# Patient Record
Sex: Female | Born: 1935
Health system: Southern US, Community
[De-identification: ages and names within clinical notes are randomized; demographics above are authoritative.]

## PROBLEM LIST (undated history)

## (undated) DIAGNOSIS — Z8679 Personal history of other diseases of the circulatory system: Secondary | ICD-10-CM

## (undated) DIAGNOSIS — H9190 Unspecified hearing loss, unspecified ear: Secondary | ICD-10-CM

## (undated) DIAGNOSIS — H8110 Benign paroxysmal vertigo, unspecified ear: Secondary | ICD-10-CM

## (undated) DIAGNOSIS — M069 Rheumatoid arthritis, unspecified: Secondary | ICD-10-CM

## (undated) DIAGNOSIS — I671 Cerebral aneurysm, nonruptured: Secondary | ICD-10-CM

## (undated) DIAGNOSIS — R55 Syncope and collapse: Secondary | ICD-10-CM

## (undated) DIAGNOSIS — I6359 Cerebral infarction due to unspecified occlusion or stenosis of other cerebral artery: Secondary | ICD-10-CM

## (undated) DIAGNOSIS — I480 Paroxysmal atrial fibrillation: Secondary | ICD-10-CM

## (undated) DIAGNOSIS — E785 Hyperlipidemia, unspecified: Secondary | ICD-10-CM

## (undated) DIAGNOSIS — F32A Depression, unspecified: Secondary | ICD-10-CM

## (undated) DIAGNOSIS — F329 Major depressive disorder, single episode, unspecified: Secondary | ICD-10-CM

## (undated) DIAGNOSIS — S0990XA Unspecified injury of head, initial encounter: Secondary | ICD-10-CM

## (undated) DIAGNOSIS — D649 Anemia, unspecified: Secondary | ICD-10-CM

## (undated) DIAGNOSIS — I609 Nontraumatic subarachnoid hemorrhage, unspecified: Secondary | ICD-10-CM

## (undated) DIAGNOSIS — I10 Essential (primary) hypertension: Secondary | ICD-10-CM

## (undated) DIAGNOSIS — I639 Cerebral infarction, unspecified: Secondary | ICD-10-CM

## (undated) DIAGNOSIS — I517 Cardiomegaly: Secondary | ICD-10-CM

## (undated) DIAGNOSIS — Z9889 Other specified postprocedural states: Secondary | ICD-10-CM

## (undated) HISTORY — DX: Cerebral aneurysm, nonruptured: I67.1

## (undated) HISTORY — DX: Nontraumatic subarachnoid hemorrhage, unspecified: I60.9

## (undated) HISTORY — PX: BREAST SURGERY: SHX581

## (undated) HISTORY — DX: Unspecified injury of head, initial encounter: S09.90XA

## (undated) HISTORY — DX: Cerebral infarction due to unspecified occlusion or stenosis of other cerebral artery: I63.59

## (undated) HISTORY — PX: EYE SURGERY: SHX253

## (undated) HISTORY — DX: Paroxysmal atrial fibrillation: I48.0

## (undated) HISTORY — DX: Hyperlipidemia, unspecified: E78.5

## (undated) HISTORY — DX: Benign paroxysmal vertigo, unspecified ear: H81.10

## (undated) HISTORY — DX: Essential (primary) hypertension: I10

## (undated) HISTORY — DX: Syncope and collapse: R55

## (undated) HISTORY — PX: ABDOMINAL HYSTERECTOMY: SHX81

## (undated) HISTORY — DX: Cardiomegaly: I51.7

---

## 2002-04-05 ENCOUNTER — Ambulatory Visit (HOSPITAL_COMMUNITY): Admission: RE | Admit: 2002-04-05 | Discharge: 2002-04-05 | Payer: Self-pay | Admitting: Specialist

## 2002-07-27 ENCOUNTER — Ambulatory Visit (HOSPITAL_COMMUNITY): Admission: RE | Admit: 2002-07-27 | Discharge: 2002-07-27 | Payer: Self-pay | Admitting: Specialist

## 2002-07-27 ENCOUNTER — Encounter: Payer: Self-pay | Admitting: Specialist

## 2004-03-17 ENCOUNTER — Encounter (INDEPENDENT_AMBULATORY_CARE_PROVIDER_SITE_OTHER): Payer: Self-pay | Admitting: *Deleted

## 2004-03-17 ENCOUNTER — Ambulatory Visit (HOSPITAL_COMMUNITY): Admission: RE | Admit: 2004-03-17 | Discharge: 2004-03-17 | Payer: Self-pay | Admitting: Gastroenterology

## 2007-08-21 ENCOUNTER — Inpatient Hospital Stay (HOSPITAL_COMMUNITY): Admission: EM | Admit: 2007-08-21 | Discharge: 2007-08-25 | Payer: Self-pay | Admitting: Emergency Medicine

## 2010-10-28 NOTE — Consult Note (Signed)
NAME:  FLORDIA, KASSEM NO.:  [id number]   MEDICAL RECORD NO.:  [id number]          PATIENT TYPE:  INP   LOCATION:  1520                         FACILITY:  [facility]   PHYSICIAN:  Ramiro Harvest, MD    DATE OF BIRTH:  [birth year]   DATE OF CONSULTATION:  08/24/2007  DATE OF DISCHARGE:                                 CONSULTATION   PRIMARY CARE PHYSICIAN:  Dr. Earl Gala of [facility]   ENT PHYSICIAN:  Dr. Lazarus Salines.   REASON FOR CONSULTATION:  Shortness of breath.   HISTORY OF PRESENT ILLNESS:  Valaria Kohut is a 81-year white female  with history of intermittent parotitis, hypertension, hyperlipidemia,  depression who was admitted on August 21, 2007 for right parotid  sialolithiasis and sialoadenitis per ENT.  The patient was placed on IV  antibiotics, IV fluids,  sialogogues  and a blood culture obtained.  The  patient subsequently passed a stone on August 22, 2007 which was a 3 x 3 x  8 mm stone without symptomatic improvement.  The patient had some mild  complaints of shortness of breath overnight on August 23, 2007 with sats  in 80's to 90's.  The patient was placed on continuous oxygen monitor  and oxygen via nasal cannula with sats going up to 92-95.  The patient  currently states that the shortness of breath was on exertion when she  tried to move last night.  Denied any chest pain, no palpitations.  No  calf tenderness, no diaphoresis, no emesis, no edema.  Did have some  associated nausea but no other associated symptoms.  The patient states  her shortness of breath has improved since last night and is currently  chest pain free.   ALLERGIES:  No known drug allergies.   PAST MEDICAL HISTORY:  1. Intermittent parotitis on the right.  2. Hypertension.  3. Hyperlipidemia.  4. Depression.  5. History of heart murmur.  6. Macular degeneration.   HOME MEDICATIONS:  1. Lipitor 10 mg daily.  2. Celexa 20 mg daily.  3. Benicar/HCT 40/12.5 mg daily.  4.  Coreg 25 mg p.o. b.i.d.  5. Norvasc 5 mg p.o. daily.   HOSPITAL MEDICATIONS:  1. Norvasc 5 mg daily.  2. Unasyn 3 g IV q.6 h.  3. Coreg 25 mg b.i.d.  4. Celexa 20 mg daily.  5. Avapro 300 mg daily.  6. HCTZ 12.5 mg daily.  7. Zocor 20 mg daily.  8. Vancomycin 750 mg IV q.36 h.  9. D5 LR 100 mL per hour, now at [facility]   SOCIAL HISTORY:  The patient lives in [place] with her husband and  daughter.  No tobacco abuse.  No alcohol use.  No IV drug use.  The  patient has two daughters and a son all of whom are healthy.   FAMILY HISTORY:  Noncontributory.   REVIEW OF SYSTEMS:  Positive for emesis; otherwise per HPI and otherwise  negative.   PHYSICAL EXAMINATION:  VITAL SIGNS:  Temperature 99.2, pulse 70, blood  pressure 129/66, respiratory rate 20, satting 95% on room air, I/O  2815, weight on August 22, 2007 was 75.48, today's weight is 77.56.  GENERAL:  Patient in no apparent distress sitting on bedside.  HEENT:  Normocephalic, atraumatic.  Pupils equal, round, and reactive to  light.  Extraocular movements intact.  Oropharynx was clear.  No  lesions.  Right parotid gland tender to palpation or edema, positive  swelling.  RESPIRATORY:  Bibasilar crackles.  No wheezes.  CARDIOVASCULAR:  Regular rate and rhythm with a 2/6 systolic ejection  murmur.  ABDOMEN:  Soft, nontender, nondistended.  Positive bowel sounds.  EXTREMITIES:  No clubbing, cyanosis or edema.  NEUROLOGICAL:  The patient is alert and oriented x3.  Cranial nerves II-  XII are grossly intact.  No focal deficits.   LABORATORY DATA:  Labs from August 23, 2007.  BMET:  Sodium 139,  potassium 3.9, chloride 106, bicarb 29, BUN 7, creatinine 1.09. Calcium  9.2.  Glucose 106.  CBC from August 22, 2007:  White count 11.2,  hemoglobin 10.2, platelets 228, hematocrit 89.2.   EKG with T wave inversion in leads 1, aVL, V2-V6.  Chest x-ray was  pending.   ASSESSMENT/PLAN:  Meryl Hubers is a 77 year old female with history  of  hypertension, hyperlipidemia, intermittent right parotitis who  presented to the hospital with a right parotid sialolithiasis and  sialoadenitis status post passage of a stone on IV antibiotics and was  short of breath last night.   ASSESSMENT/PLAN:  1. Shortness of breath likely secondary to volume overload.  Patient      with increased weight gain versus respiratory/ pneumonia.  Chest x-      ray is pending versus pulmonary embolism unlikely with a low      pretest probability versus acute coronary syndrome.  EKG does have      T wave inversions in leads 1, AVL, V2-V6.  Chest x-ray is pending.      Will check a BNP cycle, cardiac enzymes q.8 h. x3.  Check a BMET,      check a CBC.  Get an old EKG either from this hospital or from PCP.      Will give a dose of Lasix 40 mg IV x1 now and continue Coreg for      now.  Will follow along. Continue Coreg, Lipitor, Avapro, and HCTZ.  2. Anemia.  No evidence of overt gastrointestinal bleed.  Will check      an anemia panel, guaiac stools x3.  3. Hypertension.  Continue Avapro, HCTZ, Coreg, and Norvasc.  4. Hyperlipidemia.  Continue Zocor.  5. Depression.  Continue Celexa prophylaxis, Protonix for      gastrointestinal prophylaxis.  Patient with stockings for deep      venous thrombosis prophylaxis.   It has been a pleasure taking care of Ms. Julieanne Cotton.      Ramiro Harvest, MD  Electronically Signed     DT/MEDQ  D:  08/24/2007  T:  08/25/2007  Job:  [id number]

## 2010-10-31 NOTE — Discharge Summary (Signed)
NAME:  Tabitha Sanders, Tabitha Sanders NO.:  [id number]   MEDICAL RECORD NO.:  [id number]          PATIENT TYPE:  INP   LOCATION:  1520                         FACILITY:  [facility]   PHYSICIAN:  Zola Button T. Lazarus Salines, M.D. DATE OF BIRTH:  [birth year]   DATE OF ADMISSION:  08/21/2007  DATE OF DISCHARGE:  08/25/2007                               DISCHARGE SUMMARY   CHIEF COMPLAINTS:  Right cheek swelling.   HISTORY OF PRESENT ILLNESS:  A 77 year old white female with a history  of intermittent right parotitis over the years, treated successfully  with clindamycin.  She recently has developed increased pain, swelling  and probably fevers associated with the right parotid gland once again.  She was evaluated in the emergency room and felt to be appropriate for  intravenous antibiosis and hospital observation.   PAST MEDICAL HISTORY:  Negative for drug allergies.  She has  hypertension, hyperlipidemia, and history of depression.  She is taking  Lipitor, Celexa, Benicar/hydrochlorothiazide, Coreg, and Norvasc.  No  prior surgeries listed.   SOCIAL HISTORY:  She is a nonsmoker.   FAMILY HISTORY:  None available.   REVIEW OF SYSTEMS:  Noncontributory.   PHYSICAL EXAMINATION:  GENERAL:  This is a somewhat heavy-set older  white female with a swollen, tender right cheek.  Temperature 98.7.  HEENT:  Remarkable for swelling of the right parotid gland with erythema  and some pitting edema.  There is purulent discharge from right  Stensen's papilla, but no obvious stone or obstruction.  CHEST:  Clear and unlabored.  HEART:  Regular rate and rhythm without murmurs.  ABDOMEN:  Obese, soft and active.  SKIN:  Normal configuration.  NEUROLOGIC:  Intact.   ADMISSION LABORATORY STUDIES:  White blood cell count 13,000, hematocrit  31.4, hemoglobin 11.1 with a left shift.  Electrolytes were not  performed at that point.   ADMISSION DIAGNOSIS:  Acute right suppurative parotitis.   HOSPITAL COURSE:   She was admitted to the hospital and begun on  intravenous Unasyn.  Orders were written for sialogogues, warm  compresses, massage, and elevation.  On the second hospital day, the  white count was down to 10,200 but she has continued to have severe pain  and swelling of her right cheek.  She had some nausea either related to  the narcotics or the Unasyn.  There was no sialogogues present at the  bedside on rounds.  On the second day, vancomycin was added for possible  MRSA coverage, Zofran was added for nausea and then a BMET-7 was checked  for vancomycin protocol.  Creatinine was 1.1.  She was continued in the  hospital.  On the third hospital day, she reported that she had passed a  stone from the right Stensen's duct which she showed me and measured 3 x  3 x 8 mm.  She had obvious drainage following this which improved her  comfort level, but she still had substantial swelling and pitting edema  of the cheeks. A CT scan showed no additional stones.  Into the fourth  postoperative day, she noted some shortness of breath, air hunger and  feeling bloated.  No pedal edema.  She denied abdominal gas but still  some nausea.  The parotid gland was improving.  On examination, there  was no obvious wheezing or labored respiration.  A hospitalist consult  was called for and the patient was felt to have possible fluid overload,  which responded promptly on diuretics.  On the fifth hospital day with  culture showing only Candida albicans, probably contaminant and the  patient responding on intravenous antibiotics, she was discharged to her  home in the care of herself and her family.  She was asked to continue  with elevation, massage, warm compresses, and sialogogues.  An  incidental note was made of hypokalemia and anemia which will be  addressed with the hospitalist and with Dr. Earl Gala, her primary  physician.   DISCHARGE DIAGNOSES:  1. Right parotid sialoadenitis.  2. Right parotid  sialolithiasis.  3. Fluid overload.  4. Anemia.  5. Hypokalemia.  6. Hyperlipidemia   PROCEDURE PERFORMED:  None.   CONDITION AT DISCHARGE:  Ambulatory with no dyspnea, improving  parotitis, pain controlled and taking a full regular diet.  Return visit  3-4 days, Dr. Lazarus Salines; 1 week, Dr. Earl Gala.   COMPLICATIONS:  Fluid overload.   PRESCRIPTIONS:  Augmentin XR 1000 mg 2 pills twice daily.      Gloris Manchester. Lazarus Salines, M.D.  Electronically Signed     KTW/MEDQ  D:  11/21/2007  T:  11/22/2007  Job:  [id number]   cc:   Theressa Millard, M.D.  Fax: [phone]

## 2010-10-31 NOTE — Op Note (Signed)
NAME:  Tabitha Sanders, Tabitha Sanders NO.:  [id number]   MEDICAL RECORD NO.:  [id number]          PATIENT TYPE:  AMB   LOCATION:  ENDO                         FACILITY:  [facility]   PHYSICIAN:  Danise Edge, M.D.   DATE OF BIRTH:  Feb 16, 1926   DATE OF PROCEDURE:  03/17/2004  DATE OF DISCHARGE:                                 OPERATIVE REPORT   PROCEDURE:  Screening colonoscopy with polypectomy.   INDICATIONS FOR PROCEDURE:  Tabitha Sanders is a 77 year old female born  10/06/1925.  Tabitha Sanders is scheduled to undergo her first screening  colonoscopy with polypectomy to prevent colon cancer.   ENDOSCOPIST:  Danise Edge, M.D.   PREMEDICATION:  Versed 6 mg, Demerol 60 mg.   DESCRIPTION OF PROCEDURE:  After obtaining informed consent, Tabitha Sanders  was placed in the left lateral decubitus position. I administered  intravenous Demerol and intravenous Versed to achieve conscious sedation for  the procedure. The patient's blood pressure, oxygen saturation and cardiac  rhythm were monitored throughout the procedure and documented in the medical  record.   Anal inspection and digital rectal exam were normal. The Olympus adjustable  pediatric colonoscope was introduced into the rectum and advanced to the  cecum. Colonic preparation for the exam today was excellent.   RECTUM:  Normal.   SIGMOID COLON AND DESCENDING COLON:  Normal.   SPLENIC FLEXURE:  Normal.   TRANSVERSE COLON:  From the mid transverse colon, a 2 mm sessile polyp was  removed with the electrocautery snare.   HEPATIC FLEXURE:  Normal.   ASCENDING COLON:  Normal.   CECUM AND ILEOCECAL VALVE:  From the proximal cecum, a 1 mm sessile polyp  was removed with the electrocautery snare. There was a small amount of  arterial bleeding post polypectomy and an Endoclip was applied to achieve  hemostasis.   ASSESSMENT:  A small polyp was removed from the cecum and a small polyp was  removed from the mid  transverse colon. Both polyps were submitted in one  bottle for pathological evaluation.      MJ/MEDQ  D:  03/17/2004  T:  03/17/2004  Job:  [id number]   cc:   Theressa Millard, M.D.  [address] [place]  [place] [address]  Fax: [phone]

## 2011-03-09 LAB — CBC
HCT: 29.5 — ABNORMAL LOW
HCT: 31.4 — ABNORMAL LOW
Hemoglobin: 10.4 — ABNORMAL LOW
Hemoglobin: 11.1 — ABNORMAL LOW
MCHC: 35.1
MCHC: 35.3
MCV: 88.9
MCV: 89.2
Platelets: 228
Platelets: 279
RBC: 3.19 — ABNORMAL LOW
RBC: 3.26 — ABNORMAL LOW
RBC: 3.53 — ABNORMAL LOW
RDW: 13
RDW: 13.3
WBC: 11.2 — ABNORMAL HIGH
WBC: 13 — ABNORMAL HIGH

## 2011-03-09 LAB — DIFFERENTIAL
Basophils Absolute: 0
Eosinophils Absolute: 0
Eosinophils Absolute: 0.1
Eosinophils Relative: 1
Lymphocytes Relative: 10 — ABNORMAL LOW
Lymphocytes Relative: 9 — ABNORMAL LOW
Lymphs Abs: 1
Lymphs Abs: 1.1
Monocytes Absolute: 0.8
Monocytes Relative: 8
Monocytes Relative: 9
Neutro Abs: 8.1 — ABNORMAL HIGH
Neutro Abs: 9.2 — ABNORMAL HIGH
Neutrophils Relative %: 81 — ABNORMAL HIGH
Neutrophils Relative %: 82 — ABNORMAL HIGH

## 2011-03-09 LAB — CARDIAC PANEL(CRET KIN+CKTOT+MB+TROPI)
CK, MB: 1.2
CK, MB: 1.7
CK, MB: 1.9
Relative Index: 1.4
Relative Index: INVALID
Total CK: 85
Troponin I: 0.04

## 2011-03-09 LAB — CREATININE, SERUM
Creatinine, Ser: 1.1
GFR calc Af Amer: 58 — ABNORMAL LOW
GFR calc non Af Amer: 48 — ABNORMAL LOW

## 2011-03-09 LAB — BASIC METABOLIC PANEL
BUN: 4 — ABNORMAL LOW
CO2: 30
Calcium: 9.2
Calcium: 9.4
Chloride: 98
GFR calc Af Amer: 56 — ABNORMAL LOW
GFR calc Af Amer: 59 — ABNORMAL LOW
GFR calc non Af Amer: 46 — ABNORMAL LOW
GFR calc non Af Amer: 48 — ABNORMAL LOW
GFR calc non Af Amer: 51 — ABNORMAL LOW
Glucose, Bld: 128 — ABNORMAL HIGH
Potassium: 3 — ABNORMAL LOW
Potassium: 3.9
Sodium: 138
Sodium: 138
Sodium: 139

## 2011-03-09 LAB — RETICULOCYTES
RBC.: 3.47 — ABNORMAL LOW
Retic Ct Pct: 1.8

## 2011-03-09 LAB — IRON AND TIBC
Saturation Ratios: 9 — ABNORMAL LOW
UIBC: 261

## 2011-03-09 LAB — CULTURE, ROUTINE-SINUS

## 2011-03-09 LAB — FOLATE: Folate: 14.7

## 2014-06-15 DIAGNOSIS — Z8679 Personal history of other diseases of the circulatory system: Secondary | ICD-10-CM

## 2014-06-15 DIAGNOSIS — Z9889 Other specified postprocedural states: Secondary | ICD-10-CM

## 2014-06-15 HISTORY — DX: Other specified postprocedural states: Z98.890

## 2014-06-15 HISTORY — DX: Personal history of other diseases of the circulatory system: Z86.79

## 2014-08-16 ENCOUNTER — Other Ambulatory Visit: Payer: Self-pay | Admitting: Gastroenterology

## 2014-08-17 ENCOUNTER — Encounter (HOSPITAL_COMMUNITY): Payer: Self-pay | Admitting: *Deleted

## 2014-08-20 ENCOUNTER — Encounter (HOSPITAL_COMMUNITY)
Admission: RE | Disposition: A | Discharge status: Home / Self Care | Payer: Self-pay | Source: Ambulatory Visit | Attending: Gastroenterology

## 2014-08-20 ENCOUNTER — Encounter (HOSPITAL_COMMUNITY): Payer: Self-pay | Admitting: *Deleted

## 2014-08-20 ENCOUNTER — Ambulatory Visit (HOSPITAL_COMMUNITY): Payer: Medicare Other | Admitting: Certified Registered Nurse Anesthetist

## 2014-08-20 ENCOUNTER — Ambulatory Visit (HOSPITAL_COMMUNITY)
Admission: RE | Admit: 2014-08-20 | Discharge: 2014-08-20 | Disposition: A | Discharge status: Home / Self Care | Payer: Medicare Other | Source: Ambulatory Visit | Attending: Gastroenterology | Admitting: Gastroenterology

## 2014-08-20 DIAGNOSIS — Z90722 Acquired absence of ovaries, bilateral: Secondary | ICD-10-CM | POA: Insufficient documentation

## 2014-08-20 DIAGNOSIS — I10 Essential (primary) hypertension: Secondary | ICD-10-CM | POA: Diagnosis not present

## 2014-08-20 DIAGNOSIS — Z7982 Long term (current) use of aspirin: Secondary | ICD-10-CM | POA: Diagnosis not present

## 2014-08-20 DIAGNOSIS — H3530 Unspecified macular degeneration: Secondary | ICD-10-CM | POA: Diagnosis not present

## 2014-08-20 DIAGNOSIS — E780 Pure hypercholesterolemia: Secondary | ICD-10-CM | POA: Insufficient documentation

## 2014-08-20 DIAGNOSIS — Z79899 Other long term (current) drug therapy: Secondary | ICD-10-CM | POA: Insufficient documentation

## 2014-08-20 DIAGNOSIS — M353 Polymyalgia rheumatica: Secondary | ICD-10-CM | POA: Insufficient documentation

## 2014-08-20 DIAGNOSIS — D510 Vitamin B12 deficiency anemia due to intrinsic factor deficiency: Secondary | ICD-10-CM | POA: Diagnosis not present

## 2014-08-20 DIAGNOSIS — M797 Fibromyalgia: Secondary | ICD-10-CM | POA: Diagnosis not present

## 2014-08-20 DIAGNOSIS — D123 Benign neoplasm of transverse colon: Secondary | ICD-10-CM | POA: Insufficient documentation

## 2014-08-20 DIAGNOSIS — D509 Iron deficiency anemia, unspecified: Secondary | ICD-10-CM | POA: Insufficient documentation

## 2014-08-20 DIAGNOSIS — Z90710 Acquired absence of both cervix and uterus: Secondary | ICD-10-CM | POA: Diagnosis not present

## 2014-08-20 HISTORY — PX: COLONOSCOPY WITH PROPOFOL: SHX5780

## 2014-08-20 HISTORY — DX: Essential (primary) hypertension: I10

## 2014-08-20 HISTORY — DX: Major depressive disorder, single episode, unspecified: F32.9

## 2014-08-20 HISTORY — DX: Depression, unspecified: F32.A

## 2014-08-20 HISTORY — DX: Anemia, unspecified: D64.9

## 2014-08-20 SURGERY — COLONOSCOPY WITH PROPOFOL
Anesthesia: Monitor Anesthesia Care

## 2014-08-20 MED ORDER — PROPOFOL 10 MG/ML IV BOLUS
INTRAVENOUS | Status: AC
  Filled 2014-08-20: qty 20

## 2014-08-20 MED ORDER — SODIUM CHLORIDE 0.9 % IV SOLN: INTRAVENOUS | Status: DC

## 2014-08-20 MED ORDER — PROPOFOL 10 MG/ML IV BOLUS
INTRAVENOUS | Status: DC | PRN
  Administered 2014-08-20 (×2): 40 mg via INTRAVENOUS
  Administered 2014-08-20: 20 mg via INTRAVENOUS

## 2014-08-20 MED ORDER — LACTATED RINGERS IV SOLN
INTRAVENOUS | Status: DC
  Administered 2014-08-20: 1000 mL via INTRAVENOUS

## 2014-08-20 MED ORDER — LACTATED RINGERS IV SOLN
INTRAVENOUS | Status: DC | PRN
  Administered 2014-08-20: 10:00:00 via INTRAVENOUS

## 2014-08-20 SURGICAL SUPPLY — 22 items

## 2014-08-20 NOTE — Anesthesia Postprocedure Evaluation (Signed)
  Anesthesia Post-op Note  Patient: Tabitha Sanders  Procedure(s) Performed: Procedure(s) (LRB): COLONOSCOPY WITH PROPOFOL (N/A)  Patient Location: PACU  Anesthesia Type: MAC  Level of Consciousness: awake and alert   Airway and Oxygen Therapy: Patient Spontanous Breathing  Post-op Pain: mild  Post-op Assessment: Post-op Vital signs reviewed, Patient's Cardiovascular Status Stable, Respiratory Function Stable, Patent Airway and No signs of Nausea or vomiting  Last Vitals:  Filed Vitals:   08/20/14 1130  BP: 179/64  Pulse: 61  Temp:   Resp: 10    Post-op Vital Signs: stable   Complications: No apparent anesthesia complications

## 2014-08-20 NOTE — Anesthesia Preprocedure Evaluation (Signed)
Anesthesia Evaluation  Patient identified by MRN, date of birth, ID band Patient awake    Reviewed: Allergy & Precautions, NPO status , Patient's Chart, lab work & pertinent test results  Airway Mallampati: II  TM Distance: >3 FB Neck ROM: Full    Dental no notable dental hx.    Pulmonary neg pulmonary ROS,  breath sounds clear to auscultation  Pulmonary exam normal       Cardiovascular hypertension, Pt. on medications Rhythm:Regular Rate:Normal     Neuro/Psych negative neurological ROS  negative psych ROS   GI/Hepatic negative GI ROS, Neg liver ROS,   Endo/Other  negative endocrine ROS  Renal/GU negative Renal ROS  negative genitourinary   Musculoskeletal  (+) Fibromyalgia -  Abdominal   Peds negative pediatric ROS (+)  Hematology negative hematology ROS (+)   Anesthesia Other Findings   Reproductive/Obstetrics negative OB ROS                             Anesthesia Physical Anesthesia Plan  ASA: II  Anesthesia Plan: MAC   Post-op Pain Management:    Induction:   Airway Management Planned: Simple Face Mask  Additional Equipment:   Intra-op Plan:   Post-operative Plan:   Informed Consent: I have reviewed the patients History and Physical, chart, labs and discussed the procedure including the risks, benefits and alternatives for the proposed anesthesia with the patient or authorized representative who has indicated his/her understanding and acceptance.   Dental advisory given  Plan Discussed with: CRNA  Anesthesia Plan Comments:         Anesthesia Quick Evaluation

## 2014-08-20 NOTE — Transfer of Care (Signed)
Immediate Anesthesia Transfer of Care Note  Patient: Tabitha Sanders  Procedure(s) Performed: Procedure(s): COLONOSCOPY WITH PROPOFOL (N/A)  Patient Location: PACU and Endoscopy Unit  Anesthesia Type:MAC  Level of Consciousness: awake, alert , oriented, patient cooperative and responds to stimulation  Airway & Oxygen Therapy: Patient Spontanous Breathing and Patient connected to face mask oxygen  Post-op Assessment: Report given to RN, Post -op Vital signs reviewed and stable and Patient moving all extremities  Post vital signs: Reviewed and stable  Last Vitals:  Filed Vitals:   08/20/14 1003  BP: 171/55  Pulse: 61  Temp: 36.6 C  Resp: 12    Complications: No apparent anesthesia complications

## 2014-08-20 NOTE — Discharge Instructions (Signed)
Colonoscopy, Care After °These instructions give you information on caring for yourself after your procedure. Your doctor may also give you more specific instructions. Call your doctor if you have any problems or questions after your procedure. °HOME CARE °· Do not drive for 24 hours. °· Do not sign important papers or use machinery for 24 hours. °· You may shower. °· You may go back to your usual activities, but go slower for the first 24 hours. °· Take rest breaks often during the first 24 hours. °· Walk around or use warm packs on your belly (abdomen) if you have belly cramping or gas. °· Drink enough fluids to keep your pee (urine) clear or pale yellow. °· Resume your normal diet. Avoid heavy or fried foods. °· Avoid drinking alcohol for 24 hours or as told by your doctor. °· Only take medicines as told by your doctor. °If a tissue sample (biopsy) was taken during the procedure:  °· Do not take aspirin or blood thinners for 7 days, or as told by your doctor. °· Do not drink alcohol for 7 days, or as told by your doctor. °· Eat soft foods for the first 24 hours. °GET HELP IF: °You still have a small amount of blood in your poop (stool) 2-3 days after the procedure. °GET HELP RIGHT AWAY IF: °· You have more than a small amount of blood in your poop. °· You see clumps of tissue (blood clots) in your poop. °· Your belly is puffy (swollen). °· You feel sick to your stomach (nauseous) or throw up (vomit). °· You have a fever. °· You have belly pain that gets worse and medicine does not help. °MAKE SURE YOU: °· Understand these instructions. °· Will watch your condition. °· Will get help right away if you are not doing well or get worse. °Document Released: 07/04/2010 Document Revised: 06/06/2013 Document Reviewed: 02/06/2013 °[employer]® Patient Information ©2015 [employer]. This information is not intended to replace advice given to you by your health care provider. Make sure you discuss any questions you have with  your health care provider. ° °

## 2014-08-20 NOTE — Op Note (Signed)
Problem: Iron deficiency anemia on aspirin and prednisone. Low serum iron saturation. Hemoglobin 10.3-11.5 g. 03/17/2004 colonoscopy with removal of two diminutive adenomatous colon polyps. Polymyalgia rheumatica. Pernicious anemia.  Endoscopist: Earle Gell  Premedication: Propofol administered by anesthesia  Procedure: The patient was placed in the left lateral decubitus position. Anal inspection and digital rectal exam were normal. The Pentax pediatric colonoscope was introduced into the rectum and advanced to the cecum. A normal-appearing appendiceal orifice was identified. A normal-appearing ileocecal valve was identified. Colonic preparation for the exam today was good. Withdrawal time was 9 minutes  Rectum. Normal. Retroflexed view of the distal rectum normal  Sigmoid colon and descending colon. Normal  Splenic flexure. Normal  Transverse colon. From the mid transverse colon, a 5 mm sessile polyp and 3 mm sessile polyp were removed with the cold snare.  Hepatic flexure. Normal  Ascending colon. Normal  Cecum and ileocecal valve. Normal  Assessment: From the transverse colon, a 5 mm sessile polyp and 3 mm sessile polyp removed with the cold snare. Otherwise normal colonoscopy.  Recommendation: I suspect the patient developed iron deficiency anemia due to chronic gastrointestinal bleeding on aspirin and poor dietary iron absorption due to pernicious anemia. Remain off aspirin. Resume taking oral iron 325 mg daily. Recheck hemoglobin, serum ferritin, and sedimentation rate in 6 weeks.

## 2014-08-20 NOTE — H&P (Signed)
  Problem: Iron deficiency anemia on aspirin and prednisone. Low serum iron saturation. Hemoglobin 10.3-11.5 g. 03/17/2004 colonoscopy with removal of two diminutive adenomatous colon polyps. Polymyalgia rheumatica with elevated sedimentation rate. Pernicious anemia. Father and sister diagnosed with carcinoid tumors. Multiple second-degree relatives diagnosed with carcinoid tumors.  History: The patient is an 81 year old female born [birth year]. She was diagnosed with polymyalgia rheumatica approximately 3 months ago. She takes prednisone on a daily basis. She also takes aspirin 81 mg on a daily basis. In October 2005 she underwent a colonoscopy with removal of a diminutive cecal adenomatous polyp and a diminutive transverse colon adenomatous polyp.  The patient has pernicious anemia and takes oral vitamin B 12.  She was recently diagnosed with iron deficiency anemia based on a hemoglobin 11.1 g and a low serum iron saturation. Her sedimentation rate was elevated due to polymyalgia rheumatica.  The patient is scheduled to undergo diagnostic colonoscopy  Medication allergies: Codeine. Lisinopril causes cough.  Past medical history: TAH-BSO. Hypertension. Hypercholesterolemia. Aortic sclerosis murmur. Macular degeneration. Pernicious anemia. Depression. Shingles diagnosed in 2005. Polymyalgia rheumatica.  Exam: The patient is alert and lying comfortably on the endoscopy stretcher. Abdomen is soft and nontender to palpation. Lungs are clear to auscultation. Cardiac exam reveals a regular rhythm.  Plan: Proceed with diagnostic colonoscopy to evaluate iron deficiency anemia which is probably due to chronic gastrointestinal blood loss on aspirin and poor dietary iron absorption associated with pernicious anemia.

## 2014-08-21 ENCOUNTER — Encounter (HOSPITAL_COMMUNITY): Payer: Self-pay | Admitting: Gastroenterology

## 2015-01-25 ENCOUNTER — Inpatient Hospital Stay (HOSPITAL_COMMUNITY): Payer: Medicare Other

## 2015-01-25 ENCOUNTER — Emergency Department (HOSPITAL_COMMUNITY): Payer: Medicare Other

## 2015-01-25 ENCOUNTER — Inpatient Hospital Stay (HOSPITAL_COMMUNITY)
Admission: EM | Admit: 2015-01-25 | Discharge: 2015-01-27 | DRG: 066 | Disposition: A | Discharge status: Home Health | Payer: Medicare Other | Attending: Family Medicine | Admitting: Family Medicine

## 2015-01-25 ENCOUNTER — Encounter (HOSPITAL_COMMUNITY): Payer: Self-pay | Admitting: Emergency Medicine

## 2015-01-25 DIAGNOSIS — W19XXXA Unspecified fall, initial encounter: Secondary | ICD-10-CM | POA: Diagnosis present

## 2015-01-25 DIAGNOSIS — I10 Essential (primary) hypertension: Secondary | ICD-10-CM | POA: Diagnosis present

## 2015-01-25 DIAGNOSIS — Z7952 Long term (current) use of systemic steroids: Secondary | ICD-10-CM

## 2015-01-25 DIAGNOSIS — E119 Type 2 diabetes mellitus without complications: Secondary | ICD-10-CM | POA: Diagnosis present

## 2015-01-25 DIAGNOSIS — I671 Cerebral aneurysm, nonruptured: Secondary | ICD-10-CM | POA: Diagnosis not present

## 2015-01-25 DIAGNOSIS — E781 Pure hyperglyceridemia: Secondary | ICD-10-CM | POA: Diagnosis present

## 2015-01-25 DIAGNOSIS — M797 Fibromyalgia: Secondary | ICD-10-CM | POA: Diagnosis present

## 2015-01-25 DIAGNOSIS — Z888 Allergy status to other drugs, medicaments and biological substances status: Secondary | ICD-10-CM

## 2015-01-25 DIAGNOSIS — F329 Major depressive disorder, single episode, unspecified: Secondary | ICD-10-CM | POA: Diagnosis present

## 2015-01-25 DIAGNOSIS — D509 Iron deficiency anemia, unspecified: Secondary | ICD-10-CM | POA: Diagnosis present

## 2015-01-25 DIAGNOSIS — I639 Cerebral infarction, unspecified: Secondary | ICD-10-CM | POA: Diagnosis present

## 2015-01-25 DIAGNOSIS — I669 Occlusion and stenosis of unspecified cerebral artery: Secondary | ICD-10-CM | POA: Diagnosis present

## 2015-01-25 DIAGNOSIS — E785 Hyperlipidemia, unspecified: Secondary | ICD-10-CM | POA: Diagnosis not present

## 2015-01-25 DIAGNOSIS — Z6829 Body mass index (BMI) 29.0-29.9, adult: Secondary | ICD-10-CM | POA: Diagnosis not present

## 2015-01-25 DIAGNOSIS — E669 Obesity, unspecified: Secondary | ICD-10-CM | POA: Diagnosis present

## 2015-01-25 DIAGNOSIS — H8110 Benign paroxysmal vertigo, unspecified ear: Secondary | ICD-10-CM | POA: Insufficient documentation

## 2015-01-25 DIAGNOSIS — I6359 Cerebral infarction due to unspecified occlusion or stenosis of other cerebral artery: Secondary | ICD-10-CM | POA: Diagnosis not present

## 2015-01-25 DIAGNOSIS — Z79899 Other long term (current) drug therapy: Secondary | ICD-10-CM | POA: Diagnosis not present

## 2015-01-25 DIAGNOSIS — I6789 Other cerebrovascular disease: Secondary | ICD-10-CM | POA: Diagnosis not present

## 2015-01-25 HISTORY — DX: Cerebral infarction, unspecified: I63.9

## 2015-01-25 LAB — CBC
HCT: 35.5 % — ABNORMAL LOW (ref 36.0–46.0)
Hemoglobin: 12.5 g/dL (ref 12.0–15.0)
MCH: 31.8 pg (ref 26.0–34.0)
MCHC: 35.2 g/dL (ref 30.0–36.0)
MCV: 90.3 fL (ref 78.0–100.0)
Platelets: 196 10*3/uL (ref 150–400)
RBC: 3.93 MIL/uL (ref 3.87–5.11)
RDW: 12.9 % (ref 11.5–15.5)
WBC: 9.7 10*3/uL (ref 4.0–10.5)

## 2015-01-25 LAB — DIFFERENTIAL
Basophils Absolute: 0 10*3/uL (ref 0.0–0.1)
Basophils Relative: 0 % (ref 0–1)
Eosinophils Absolute: 0.1 10*3/uL (ref 0.0–0.7)
Eosinophils Relative: 1 % (ref 0–5)
LYMPHS ABS: 0.9 10*3/uL (ref 0.7–4.0)
LYMPHS PCT: 10 % — AB (ref 12–46)
Monocytes Absolute: 0.6 10*3/uL (ref 0.1–1.0)
Monocytes Relative: 6 % (ref 3–12)
NEUTROS ABS: 8 10*3/uL — AB (ref 1.7–7.7)
NEUTROS PCT: 83 % — AB (ref 43–77)

## 2015-01-25 LAB — URINALYSIS, ROUTINE W REFLEX MICROSCOPIC
BILIRUBIN URINE: NEGATIVE
Glucose, UA: NEGATIVE mg/dL
Hgb urine dipstick: NEGATIVE
KETONES UR: NEGATIVE mg/dL
Nitrite: NEGATIVE
Protein, ur: NEGATIVE mg/dL
Specific Gravity, Urine: 1.014 (ref 1.005–1.030)
UROBILINOGEN UA: 0.2 mg/dL (ref 0.0–1.0)
pH: 7 (ref 5.0–8.0)

## 2015-01-25 LAB — COMPREHENSIVE METABOLIC PANEL
ALBUMIN: 3.9 g/dL (ref 3.5–5.0)
ALK PHOS: 68 U/L (ref 38–126)
ALT: 13 U/L — ABNORMAL LOW (ref 14–54)
ANION GAP: 9 (ref 5–15)
AST: 18 U/L (ref 15–41)
BILIRUBIN TOTAL: 0.9 mg/dL (ref 0.3–1.2)
BUN: 16 mg/dL (ref 6–20)
CO2: 24 mmol/L (ref 22–32)
CREATININE: 1.11 mg/dL — AB (ref 0.44–1.00)
Calcium: 10.4 mg/dL — ABNORMAL HIGH (ref 8.9–10.3)
Chloride: 108 mmol/L (ref 101–111)
GFR calc non Af Amer: 43 mL/min — ABNORMAL LOW (ref >60)
GFR, EST AFRICAN AMERICAN: 50 mL/min — AB (ref >60)
GLUCOSE: 114 mg/dL — AB (ref 65–99)
POTASSIUM: 4.2 mmol/L (ref 3.5–5.1)
Sodium: 141 mmol/L (ref 135–145)
Total Protein: 6.7 g/dL (ref 6.5–8.1)

## 2015-01-25 LAB — URINE MICROSCOPIC-ADD ON

## 2015-01-25 LAB — I-STAT CHEM 8, ED
BUN: 18 mg/dL (ref 6–20)
CHLORIDE: 105 mmol/L (ref 101–111)
Calcium, Ion: 1.31 mmol/L — ABNORMAL HIGH (ref 1.13–1.30)
Creatinine, Ser: 1 mg/dL (ref 0.44–1.00)
Glucose, Bld: 110 mg/dL — ABNORMAL HIGH (ref 65–99)
HCT: 36 % (ref 36.0–46.0)
HEMOGLOBIN: 12.2 g/dL (ref 12.0–15.0)
Potassium: 4 mmol/L (ref 3.5–5.1)
Sodium: 140 mmol/L (ref 135–145)
TCO2: 23 mmol/L (ref 0–100)

## 2015-01-25 LAB — I-STAT TROPONIN, ED: Troponin i, poc: 0.02 ng/mL (ref 0.00–0.08)

## 2015-01-25 LAB — RAPID URINE DRUG SCREEN, HOSP PERFORMED
AMPHETAMINES: NOT DETECTED
Barbiturates: NOT DETECTED
Benzodiazepines: NOT DETECTED
Cocaine: NOT DETECTED
OPIATES: NOT DETECTED
TETRAHYDROCANNABINOL: NOT DETECTED

## 2015-01-25 LAB — APTT: aPTT: 29 seconds (ref 24–37)

## 2015-01-25 LAB — PROTIME-INR
INR: 0.99 (ref 0.00–1.49)
Prothrombin Time: 13.3 seconds (ref 11.6–15.2)

## 2015-01-25 LAB — ETHANOL

## 2015-01-25 MED ORDER — LORAZEPAM 2 MG/ML IJ SOLN
1.0000 mg | Freq: Once | INTRAMUSCULAR | Status: AC
  Administered 2015-01-25: 1 mg via INTRAVENOUS
  Filled 2015-01-25: qty 1

## 2015-01-25 MED ORDER — STROKE: EARLY STAGES OF RECOVERY BOOK
Freq: Once | Status: AC
  Administered 2015-01-25: 22:00:00

## 2015-01-25 MED ORDER — ENOXAPARIN SODIUM 40 MG/0.4ML ~~LOC~~ SOLN
40.0000 mg | SUBCUTANEOUS | Status: DC
  Administered 2015-01-25 – 2015-01-26 (×2): 40 mg via SUBCUTANEOUS
  Filled 2015-01-25 (×2): qty 0.4

## 2015-01-25 MED ORDER — ATORVASTATIN CALCIUM 40 MG PO TABS
40.0000 mg | ORAL_TABLET | Freq: Every day | ORAL | Status: DC
  Administered 2015-01-25 – 2015-01-26 (×2): 40 mg via ORAL
  Filled 2015-01-25 (×2): qty 1

## 2015-01-25 MED ORDER — ACETAMINOPHEN 325 MG PO TABS
650.0000 mg | ORAL_TABLET | Freq: Four times a day (QID) | ORAL | Status: DC | PRN
  Administered 2015-01-26 – 2015-01-27 (×3): 650 mg via ORAL
  Filled 2015-01-25 (×3): qty 2

## 2015-01-25 MED ORDER — CITALOPRAM HYDROBROMIDE 10 MG PO TABS
20.0000 mg | ORAL_TABLET | Freq: Every morning | ORAL | Status: DC
  Administered 2015-01-26 – 2015-01-27 (×2): 20 mg via ORAL
  Filled 2015-01-25 (×2): qty 2

## 2015-01-25 MED ORDER — PREDNISONE 5 MG PO TABS
2.5000 mg | ORAL_TABLET | ORAL | Status: DC
  Administered 2015-01-26: 2.5 mg via ORAL
  Filled 2015-01-25: qty 1

## 2015-01-25 NOTE — ED Provider Notes (Signed)
CSN: [id number]     Arrival date & time 01/25/15  1145 History   First MD Initiated Contact with Patient 01/25/15 1206     Chief Complaint  Patient presents with  . Fall  . Dizziness     (Consider location/radiation/quality/duration/timing/severity/associated sxs/prior Treatment) HPI  Pt presenting with c/o vertigo - she states she woke up with these symptoms 10 days ago.  First day associated with some nausea and vomiting.  Feels off balance and like it is difficult to walk in a straight line.  Yesterday she stood up from the couch, turned her head and then fell to the ground.  No syncope.  Now c/o pain in right hip/low back.  Took tylenol this morning which did help somewhat with pain.  No weakness of arms or legs, no incontinence of bowel or bladder, no urinary retention.  No headache, no changes in vision or speech.  There are no other associated systemic symptoms, there are no other alleviating or modifying factors.   Past Medical History  Diagnosis Date  . Hypertension   . Depression   . Anemia     iron deficiency anemia and pernicious  . Fibromyalgia    Past Surgical History  Procedure Laterality Date  . Breast surgery      biopsy- bilateral- benign  . Eye surgery      bilateral cataract with lens implant  . Abdominal hysterectomy      complete  . Colonoscopy with propofol N/A 08/20/2014    Procedure: COLONOSCOPY WITH PROPOFOL;  Surgeon: Howell Rucks, MD;  Location: [facility];  Service: Endoscopy;  Laterality: N/A;   No family history on file. Social History  Substance Use Topics  . Smoking status: Never Smoker   . Smokeless tobacco: None  . Alcohol Use: No   OB History    No data available     Review of Systems  ROS reviewed and all otherwise negative except for mentioned in HPI   Allergies  Lisinopril  Home Medications   Prior to Admission medications   Medication Sig Start Date End Date Taking? Authorizing Provider  amLODipine (NORVASC) 10 MG tablet  Take 10 mg by mouth daily. 11/21/14  Yes Historical Provider, MD  carvedilol (COREG) 25 MG tablet Take 12.5 mg by mouth 2 (two) times daily with a meal.   Yes Historical Provider, MD  citalopram (CELEXA) 20 MG tablet Take 20 mg by mouth every morning.   Yes Historical Provider, MD  ergocalciferol (VITAMIN D2) 50000 UNITS capsule Take 50,000 Units by mouth once a week. Friday   Yes Historical Provider, MD  losartan-hydrochlorothiazide (HYZAAR) 100-25 MG per tablet Take 1 tablet by mouth every morning.   Yes Historical Provider, MD  Multiple Vitamins-Minerals (PRESERVISION AREDS PO) Take 1 capsule by mouth 2 (two) times daily.   Yes Historical Provider, MD  predniSONE (DELTASONE) 5 MG tablet Take 2.5 mg by mouth every other day.    Yes Historical Provider, MD  vitamin B-12 (CYANOCOBALAMIN) 1000 MCG tablet Take 1,000 mcg by mouth every morning.   Yes Historical Provider, MD  aspirin EC 325 MG EC tablet Take 1 tablet (325 mg total) by mouth daily. 01/27/15   [name], MD  atorvastatin (LIPITOR) 40 MG tablet Take 1 tablet (40 mg total) by mouth daily at 6 PM. 01/27/15   Alyssa A Haney, MD   BP 164/55 mmHg  Pulse 59  Temp(Src) 98 F (36.7 C) (Oral)  Resp 18  Ht 5\' 3"  (1.6 m)  Wt 166 lb 14.2 oz (75.7 kg)  BMI 29.57 kg/m2  SpO2 98%  Vitals reviewed Physical Exam  Physical Examination: General appearance - alert, well appearing, and in no distress Mental status - alert, oriented to person, place, and time Eyes - pupils equal and reactive, extraocular eye movements intact, no nystagmus Mouth - mucous membranes moist, pharynx normal without lesions Chest - clear to auscultation, no wheezes, rales or rhonchi, symmetric air entry Heart - normal rate, regular rhythm, normal S1, S2, no murmurs, rubs, clicks or gallops Abdomen - soft, nontender, nondistended, no masses or organomegaly Neurological - alert, oriented x 3, cranial nerves 2-12 tested and intact, strength 5/5 in extremities x 4, sensation  intact Extremities - peripheral pulses normal, no pedal edema, no clubbing or cyanosis Skin - normal coloration and turgor, no rashes  ED Course  Procedures (including critical care time) Labs Review Labs Reviewed  CBC - Abnormal; Notable for the following:    HCT 35.5 (*)    All other components within normal limits  DIFFERENTIAL - Abnormal; Notable for the following:    Neutrophils Relative % 83 (*)    Neutro Abs 8.0 (*)    Lymphocytes Relative 10 (*)    All other components within normal limits  COMPREHENSIVE METABOLIC PANEL - Abnormal; Notable for the following:    Glucose, Bld 114 (*)    Creatinine, Ser 1.11 (*)    Calcium 10.4 (*)    ALT 13 (*)    GFR calc non Af Amer 43 (*)    GFR calc Af Amer 50 (*)    All other components within normal limits  URINALYSIS, ROUTINE W REFLEX MICROSCOPIC (NOT AT [facility]) - Abnormal; Notable for the following:    Leukocytes, UA TRACE (*)    All other components within normal limits  URINE MICROSCOPIC-ADD ON - Abnormal; Notable for the following:    Squamous Epithelial / LPF FEW (*)    All other components within normal limits  LIPID PANEL - Abnormal; Notable for the following:    Triglycerides 351 (*)    HDL 30 (*)    VLDL 70 (*)    All other components within normal limits  I-STAT CHEM 8, ED - Abnormal; Notable for the following:    Glucose, Bld 110 (*)    Calcium, Ion 1.31 (*)    All other components within normal limits  ETHANOL  PROTIME-INR  APTT  URINE RAPID DRUG SCREEN, HOSP PERFORMED  HEMOGLOBIN A1C  I-STAT TROPOININ, ED    Imaging Review No results found. Dagoberto Ligas, personally reviewed and evaluated these images and lab results as part of my medical decision-making.   EKG Interpretation   Date/Time:  Friday January 25 2015 12:22:27 EDT Ventricular Rate:  67 PR Interval:  176 QRS Duration: 86 QT Interval:  468 QTC Calculation: 494 R Axis:   37 Text Interpretation:  Sinus rhythm Abnormal R-wave progression,  early  transition Repol abnrm suggests ischemia, anterolateral Minimal ST  elevation, inferior leads No significant change since last tracing  Confirmed by [facility]  MD, Mercy Malena [id number]) on 01/25/2015 12:47:51 PM      MDM   Final diagnoses:  Cerebral infarction due to occlusion of other cerebral artery    Pt presenting with c/o vertigo, her neurologic exam is normal.  Workup reassuring.  Pt with no acute bleed on head CT.  MR brain ordered to further evaluate for posterior stroke as possible cause of her vertigo.  Pt signed out to Dr.  Gentry at end of shift pending MR results.  If no stroke, patient can be discharged with meclizine, if MR has acute findings then patient will need admission.      Alfonzo Beers, MD 01/28/15 [phone]

## 2015-01-25 NOTE — ED Notes (Signed)
Regular Meal tray has been ordered-per Therapist, sports.

## 2015-01-25 NOTE — H&P (Signed)
[facility] Admission History and Physical Service Pager: [phone]  Patient name: Tabitha Sanders Medical record number: [id number] Date of birth: May 03, 1926 Age: 81 y.o. Gender: female  Primary Care Provider: Mathews Argyle, MD Consultants: none Code Status: Full  Chief Complaint: dizzy  Assessment and Plan: KAMLA SKILTON is a 81 y.o. female presenting with vertigo and stroke . PMH is significant for HTN, anemia, fibromyalgia, Depression  # Right Posterior limb internal capsule infarct: symptoms for past several weeks of vertigo/dizziness, leading to fall yesterday. CT head neg, MRI confirming post limb IC infarct on the right side.  - telemetry - MRA brain - echo, carotid duplex - neuro checks overnight - risk strat labs: lipid, a1c - antiplatelet: daily ASA - PT/OT/SLP evals  # HTN: - permissive htn overnight  # Hip pain secondary to fall: x-rays negative for fracture in ED - tylenol PRN  # Depression: stable - continue citalopram  # Chronic prednisone use: ?used for fibromyalgia, will clarify in the morning  FEN/GI: reg diet pending stroke swallow screen / saline lock Prophylaxis: lovenox sq  Disposition: admit  History of Present Illness: Tabitha Sanders is a 81 y.o. female presenting with dizziness. Pt reports symptoms have been going on for at least the past week to 10 days ago. Yesterday dizziness was worse and caused her to fall, she states she did not strike her head. She continued to feel dizzy but did not come to the ED until her daughter saw her today and she told her about the fall. She denies any pain currently except for a small amount in her hip from the fall. She has not tried to do anything for the dizziness, but has not been driving as much because of it. She denies any similar symptoms prior to this started recently. She does not have any weakness, no changes in vision, no HA, no changes in hearing.   Review Of  Systems: Per HPI with the following additions: no CP, no SOB, no dysuria, no abdominal pain Otherwise 12 point review of systems was performed and was unremarkable.  Patient Active Problem List   Diagnosis Date Noted  . Stroke 01/25/2015   Past Medical History: Past Medical History  Diagnosis Date  . Hypertension   . Depression   . Anemia     iron deficiency anemia and pernicious  . Fibromyalgia    Past Surgical History: Past Surgical History  Procedure Laterality Date  . Breast surgery      biopsy- bilateral- benign  . Eye surgery      bilateral cataract with lens implant  . Abdominal hysterectomy      complete  . Colonoscopy with propofol N/A 08/20/2014    Procedure: COLONOSCOPY WITH PROPOFOL;  Surgeon: Howell Rucks, MD;  Location: [facility];  Service: Endoscopy;  Laterality: N/A;   Social History: Social History  Substance Use Topics  . Smoking status: Never Smoker   . Smokeless tobacco: None  . Alcohol Use: No   Additional social history: lives at home with husband whom she takes care of. Does cooking/cleaning/driving. Please also refer to relevant sections of EMR.  Family History: No family history on file. Allergies and Medications: Allergies  Allergen Reactions  . Lisinopril Other (See Comments)    cough   No current facility-administered medications on file prior to encounter.   Current Outpatient Prescriptions on File Prior to Encounter  Medication Sig Dispense Refill  . atorvastatin (LIPITOR) 10 MG tablet Take 10 mg  by mouth at bedtime.    . carvedilol (COREG) 25 MG tablet Take 12.5 mg by mouth 2 (two) times daily with a meal.    . citalopram (CELEXA) 20 MG tablet Take 20 mg by mouth every morning.    . ergocalciferol (VITAMIN D2) 50000 UNITS capsule Take 50,000 Units by mouth once a week. Friday    . losartan-hydrochlorothiazide (HYZAAR) 100-25 MG per tablet Take 1 tablet by mouth every morning.    . Multiple Vitamins-Minerals (PRESERVISION AREDS  PO) Take 1 capsule by mouth 2 (two) times daily.    . predniSONE (DELTASONE) 5 MG tablet Take 2.5 mg by mouth every other day.     . vitamin B-12 (CYANOCOBALAMIN) 1000 MCG tablet Take 1,000 mcg by mouth every morning.      Objective: BP 139/64 mmHg  Pulse 68  Temp(Src) 98.1 F (36.7 C) (Oral)  Resp 10  Ht 5\' 3"  (1.6 m)  Wt 166 lb (75.297 kg)  BMI 29.41 kg/m2  SpO2 96% Exam: General: NAD, pleasant Eyes: EOMI, PERRL, anicteric sclera ENTM: MMM, no oropharyngeal lesions Neck: supple Cardiovascular: RRR, normal s1 and s2, no murmur appreciated. 2+ radial, PT pulses bilaterally. No LE edema. Respiratory: CTAB, normal effort Abdomen: soft, NTND normal bowel sounds MSK: normal tone. No deformities Skin: no rashes  Neuro: alert and oriented x3, CN2-12 normal. No pronator drift. Strength intact UE/LE bilaterally.  Psych: mood normal. Normal speech and thought content.  Labs and Imaging: CBC BMET   Recent Labs Lab 01/25/15 1342 01/25/15 1346  WBC 9.7  --   HGB 12.5 12.2  HCT 35.5* 36.0  PLT 196  --     Recent Labs Lab 01/25/15 1342 01/25/15 1346  NA 141 140  K 4.2 4.0  CL 108 105  CO2 24  --   BUN 16 18  CREATININE 1.11* 1.00  GLUCOSE 114* 110*  CALCIUM 10.4*  --      Ct Head Wo Contrast  01/25/2015   CLINICAL DATA:  Vertigo 10 days ago.  EXAM: CT HEAD WITHOUT CONTRAST  TECHNIQUE: Contiguous axial images were obtained from the base of the skull through the vertex without contrast.  COMPARISON:  08/23/2007  FINDINGS: There are basal ganglia calcifications. No evidence for acute hemorrhage, mass lesion, midline shift, hydrocephalus or large infarct. Visualized paranasal sinuses are clear. No acute bone abnormality.  IMPRESSION: No acute intracranial abnormality.   Electronically Signed   By: Markus Daft M.D.   On: 01/25/2015 13:12   Mr Brain Wo Contrast  01/25/2015   CLINICAL DATA:  Vertigo. Dizziness for 10 days. Fell yesterday after standing up quickly.  EXAM: MRI HEAD  WITHOUT CONTRAST  TECHNIQUE: Multiplanar, multiecho pulse sequences of the brain and surrounding structures were obtained without intravenous contrast.  COMPARISON:  Head CT 01/25/2015  FINDINGS: There is no evidence of acute or subacute large territory infarct. There is a punctate 4 mm focus of increased signal in the posterior limb of the right internal capsule on axial diffusion weighted images with evidence of restricted diffusion on the ADC map. This is not confirmed on coronal diffusion imaging, however this may be due to the lesion's small size and slice selection.  There is no evidence of intracranial hemorrhage, mass, midline shift, or extra-axial fluid collection. Mild cerebral atrophy is within normal limits for age. Small foci of T2 hyperintensity in the subcortical and deep cerebral white matter are nonspecific but compatible with mild chronic small vessel ischemic disease.  Prior bilateral cataract extraction  is noted. Paranasal sinuses are clear. There is a small left mastoid effusion. Major intracranial vascular flow voids are preserved.  IMPRESSION: 1. Suspected punctate acute infarct in the posterior limb of the right internal capsule. 2. Mild chronic small vessel ischemic disease in the cerebral white matter.   Electronically Signed   By: Logan Bores   On: 01/25/2015 17:04   Dg Hip Unilat With Pelvis 2-3 Views Right  01/25/2015   CLINICAL DATA:  Acute right hip pain after fall yesterday. Initial encounter.  EXAM: DG HIP (WITH OR WITHOUT PELVIS) 2-3V RIGHT  COMPARISON:  None.  FINDINGS: There is no evidence of hip fracture or dislocation. There is no evidence of arthropathy or other focal bone abnormality.  IMPRESSION: Normal right hip.   Electronically Signed   By: Marijo Conception, M.D.   On: 01/25/2015 13:19     Leone Brand, MD 01/25/2015, 5:35 PM PGY-3, [place] [facility] pager: [phone], text pages welcome

## 2015-01-25 NOTE — ED Notes (Signed)
Patient transported to MRI 

## 2015-01-25 NOTE — Consult Note (Signed)
Neurology Consultation Reason for Consult: Stroke Referring Physician: Hinton Rao  CC: Stroke  History is obtained from: Patient  HPI: Tabitha Sanders is a 81 y.o. female with a history of hypertension who has been complaining of dizziness with standing for the past few weeks. She states that his been getting gradually better over the past 10 days. She had an MRI which revealed small area of infarct and therefore she has been admitted for further evaluation.   LKW: 10 days ago tpa given?: no, out of window   ROS: A 14 point ROS was performed and is negative except as noted in the HPI.   Past Medical History  Diagnosis Date  . Hypertension   . Depression   . Anemia     iron deficiency anemia and pernicious  . Fibromyalgia     Family History: Stroke  Social History: Tob: Denies  Exam: Current vital signs: BP 135/52 mmHg  Pulse 58  Temp(Src) 98.4 F (36.9 C) (Oral)  Resp 13  Ht 5\' 3"  (1.6 m)  Wt 75.7 kg (166 lb 14.2 oz)  BMI 29.57 kg/m2  SpO2 97% Vital signs in last 24 hours: Temp:  [98.1 F (36.7 C)-98.4 F (36.9 C)] 98.4 F (36.9 C) (08/12 2120) Pulse Rate:  [58-70] 58 (08/12 1920) Resp:  [10-18] 13 (08/12 2120) BP: (135-176)/(52-67) 135/52 mmHg (08/12 2120) SpO2:  [95 %-98 %] 97 % (08/12 2120) Weight:  [75.297 kg (166 lb)-75.7 kg (166 lb 14.2 oz)] 75.7 kg (166 lb 14.2 oz) (08/12 1920)  Physical Exam  Constitutional: Appears well-developed and well-nourished.  Psych: Affect appropriate to situation Eyes: No scleral injection HENT: No OP obstrucion Head: Normocephalic.  Cardiovascular: Normal rate and regular rhythm.  Respiratory: Effort normal and breath sounds normal to anterior ascultation GI: Soft.  No distension. There is no tenderness.  Skin: WDI  Neuro: Mental Status: Patient is awake, alert, oriented to person, place, month, year, and situation. Patient is able to give a clear and coherent history. No signs of aphasia or neglect Cranial  Nerves: II: Visual Fields are full. Pupils are equal, round, and reactive to light.   III,IV, VI: EOMI without ptosis or diploplia.  V: Facial sensation is symmetric to temperature VII: Facial movement is symmetric.  VIII: hearing is intact to voice X: Uvula elevates symmetrically XI: Shoulder shrug is symmetric. XII: tongue is midline without atrophy or fasciculations.  Motor: Tone is normal. Bulk is normal. 5/5 strength was present in all four extremities.  Sensory: Sensation is symmetric to light touch and temperature in the arms and legs. Deep Tendon Reflexes: 2+ and symmetric in the biceps and patellae.  Cerebellar: FNF and HKS are intact bilaterally         I have reviewed labs in epic and the results pertinent to this consultation are: Chem 8-unremarkable  I have reviewed the images obtained: MRI brain-small area of restricted diffusion that appears negative starting to fade which would be consistent with timeframe that she gives of 10 days. It is not present on the coronals, however when using localizer it appears that the coronal's give a slice anterior and posterior to the lesion visualized.  Impression: 81 year old female with 10 days of dizziness which worsens with standing was found to have tiny infarct on MRI. Dizziness is a very nonspecific symptom, and the for fact that this is present more with standing up does make me wonder if it is due to orthostatic hypotension as opposed to the area of infarct seen  on MRI. In any case, she does need to be further evaluated for secondary stroke prevention.   Recommendations: 1. HgbA1c, fasting lipid panel 2. MRI, MRA  of the brain without contrast 3. Frequent neuro checks 4. Echocardiogram 5. Carotid dopplers 6. Prophylactic therapy-Antiplatelet med: Aspirin - dose 325mg  PO or 300mg  PR 7. Risk factor modification 8. Telemetry monitoring 9. Orthostatic vital signs   Roland Rack, MD [facility] [phone]  If 7pm- 7am, please page neurology on call as listed in [place].

## 2015-01-25 NOTE — ED Notes (Signed)
Patient given meal tray.  Patient informed report has been called and she will be taken upstairs shortly.

## 2015-01-25 NOTE — ED Provider Notes (Signed)
Assumed care of pt from Dr. Canary Brim.  Pt with vertigo, fall today.  Atraumatic.  Awaiting MRI on assumption of care.  MRI demonstrated new stroke. Consulted neurology and medicine for admission  Debby Freiberg, MD 01/25/15 [phone]

## 2015-01-25 NOTE — Progress Notes (Signed)
Pt arrived at [place] from ED. Pt alert and orientedx4. C/O of back pain 4/10. Safety measures in placed. Call light within reach. Will continue to monitor.  Docia Barrier, RN

## 2015-01-25 NOTE — ED Notes (Signed)
Dr. Gentry in to speak with patient and family. 

## 2015-01-25 NOTE — ED Notes (Signed)
Pt c/o dizziness  x 10 days-- saw dr Felipa Eth for same yesterday-- yesterday -- after standing up quickly, fell to the floor-- c/o low back pain, right hip/side pain

## 2015-01-26 ENCOUNTER — Other Ambulatory Visit (HOSPITAL_COMMUNITY): Payer: Medicare Other

## 2015-01-26 ENCOUNTER — Inpatient Hospital Stay (HOSPITAL_COMMUNITY): Payer: Medicare Other

## 2015-01-26 DIAGNOSIS — H8110 Benign paroxysmal vertigo, unspecified ear: Secondary | ICD-10-CM | POA: Insufficient documentation

## 2015-01-26 DIAGNOSIS — I6789 Other cerebrovascular disease: Secondary | ICD-10-CM

## 2015-01-26 DIAGNOSIS — I6359 Cerebral infarction due to unspecified occlusion or stenosis of other cerebral artery: Secondary | ICD-10-CM | POA: Insufficient documentation

## 2015-01-26 LAB — LIPID PANEL
CHOL/HDL RATIO: 6.2 ratio
Cholesterol: 186 mg/dL (ref 0–200)
HDL: 30 mg/dL — AB (ref >40)
LDL CALC: 86 mg/dL (ref 0–99)
TRIGLYCERIDES: 351 mg/dL — AB (ref <150)
VLDL: 70 mg/dL — ABNORMAL HIGH (ref 0–40)

## 2015-01-26 MED ORDER — ASPIRIN EC 325 MG PO TBEC
325.0000 mg | DELAYED_RELEASE_TABLET | Freq: Every day | ORAL | Status: DC
  Administered 2015-01-27: 325 mg via ORAL
  Filled 2015-01-26: qty 1

## 2015-01-26 NOTE — Progress Notes (Signed)
  Echocardiogram 2D Echocardiogram has been performed.  Tabitha Sanders 01/26/2015, 4:30 PM

## 2015-01-26 NOTE — Progress Notes (Signed)
Family Medicine Teaching Service Daily Progress Note Intern Pager: [phone]  Patient name: Tabitha Sanders Medical record number: [id number] Date of birth: 06/25/1925 Age: 81 y.o. Gender: female  Primary Care Provider: Mathews Argyle, MD Consultants: Neurology Code Status: Full code  Pt Overview and Major Events to Date:  8/13: MRA: medium sized aneurysm   Assessment and Plan: Tabitha Sanders is a 81 y.o. female presenting with vertigo and stroke . PMH is significant for HTN, anemia, fibromyalgia, Depression  # Right Posterior limb internal capsule infarct: symptoms for past several weeks of vertigo/dizziness, leading to fall yesterday. CT head neg, MRI confirming post limb IC infarct on the right side. Medium sized aneurysm arising from cavernous L. ICA. According to ISUIA study, represents low rate of rupture based on location and five-year rate of rupture of 0% based on location and size. No hyperlipidemia but has a hypertriglyceridemia - telemetry - echo, carotid duplex - neuro checks - risk strat labs: a1c pending - antiplatelet: daily ASA - PT/OT/SLP evals - Indication for statin therapy. With regards to age, may be a discussion between patient and primary care physician  # HTN: blood pressures on lower side - permissive htn overnight - holding losartan-hctz, amlodipine and carvedilol  # Hip pain secondary to fall: x-rays negative for fracture in ED - tylenol PRN  # Depression: stable - continue citalopram  # PMR - continue prednisone 2.5mg  QOD  FEN/GI: heart healthy / saline lock Prophylaxis: lovenox sq  Disposition: Probably home pending stroke work up and PT/OT eval  Subjective:  Patient reports no problems overnight. She has not felt dizzy but also states she has not gotten out of bed, so is unsure if her symptoms will return. PT came by to see but patient was   Objective: Temp:  [98 F (36.7 C)-99.3 F (37.4 C)] 98 F (36.7 C) (08/13 0941) Pulse  Rate:  [52-70] 54 (08/13 0941) Resp:  [10-18] 14 (08/13 0941) BP: (125-176)/(51-67) 145/54 mmHg (08/13 0941) SpO2:  [95 %-98 %] 97 % (08/13 0941) Weight:  [166 lb (75.297 kg)-166 lb 14.2 oz (75.7 kg)] 166 lb 14.2 oz (75.7 kg) (08/12 1920)  Physical Exam: General: Well appearing, no distress Cardiovascular: Regular rate and rhythm, no murmur Respiratory: Clear to auscultation bilaterally, no wheezing Abdomen: Soft, non-tender, non-distended Extremities: No tenderness of edema Cranial Nerves II - XII - II - Visual field intact OU. III, IV, VI - Extraocular movements intact. V - Facial sensation intact bilaterally. VII - Slight left side facial droop. VIII - Hearing intact X - Palate elevates symmetrically, no dysarthria. XI - Shoulder shrug intact bilaterally. XII - Tongue protrusion intact. Motor Strength - The patient's strength was 5/5 in upper extremities and 4/5 in lower extremities bilaterally. Bulk was normal and fasciculations were absent.  Motor Tone - Muscle tone was assessed at the neck and appendages and was normal. Coordination - The patient had normal movements in the hands with no ataxia or dysmetria. Tremor was absent.  Laboratory:  Recent Labs Lab 01/25/15 1342 01/25/15 1346  WBC 9.7  --   HGB 12.5 12.2  HCT 35.5* 36.0  PLT 196  --     Recent Labs Lab 01/25/15 1342 01/25/15 1346  NA 141 140  K 4.2 4.0  CL 108 105  CO2 24  --   BUN 16 18  CREATININE 1.11* 1.00  CALCIUM 10.4*  --   PROT 6.7  --   BILITOT 0.9  --   ALKPHOS 68  --  ALT 13*  --   AST 18  --   GLUCOSE 114* 110*   Risk Stratification Labs  Hemoglobin A1C No results found for: HGBA1C   Lipid Panel     Component Value Date/Time   CHOL 186 01/26/2015 0557   TRIG 351* 01/26/2015 0557   HDL 30* 01/26/2015 0557   CHOLHDL 6.2 01/26/2015 0557   VLDL 70* 01/26/2015 0557   LDLCALC 86 01/26/2015 0557     Imaging/Diagnostic Tests: Ct Head Wo Contrast  01/25/2015   CLINICAL  DATA:  Vertigo 10 days ago.  EXAM: CT HEAD WITHOUT CONTRAST  TECHNIQUE: Contiguous axial images were obtained from the base of the skull through the vertex without contrast.  COMPARISON:  08/23/2007  FINDINGS: There are basal ganglia calcifications. No evidence for acute hemorrhage, mass lesion, midline shift, hydrocephalus or large infarct. Visualized paranasal sinuses are clear. No acute bone abnormality.  IMPRESSION: No acute intracranial abnormality.   Electronically Signed   By: Markus Daft M.D.   On: 01/25/2015 13:12   Mr Brain Wo Contrast  01/25/2015   CLINICAL DATA:  Vertigo. Dizziness for 10 days. Fell yesterday after standing up quickly.  EXAM: MRI HEAD WITHOUT CONTRAST  TECHNIQUE: Multiplanar, multiecho pulse sequences of the brain and surrounding structures were obtained without intravenous contrast.  COMPARISON:  Head CT 01/25/2015  FINDINGS: There is no evidence of acute or subacute large territory infarct. There is a punctate 4 mm focus of increased signal in the posterior limb of the right internal capsule on axial diffusion weighted images with evidence of restricted diffusion on the ADC map. This is not confirmed on coronal diffusion imaging, however this may be due to the lesion's small size and slice selection.  There is no evidence of intracranial hemorrhage, mass, midline shift, or extra-axial fluid collection. Mild cerebral atrophy is within normal limits for age. Small foci of T2 hyperintensity in the subcortical and deep cerebral white matter are nonspecific but compatible with mild chronic small vessel ischemic disease.  Prior bilateral cataract extraction is noted. Paranasal sinuses are clear. There is a small left mastoid effusion. Major intracranial vascular flow voids are preserved.  IMPRESSION: 1. Suspected punctate acute infarct in the posterior limb of the right internal capsule. 2. Mild chronic small vessel ischemic disease in the cerebral white matter.   Electronically Signed    By: Logan Bores   On: 01/25/2015 17:04   Mr Jodene Nam Head/brain Wo Cm  01/25/2015   CLINICAL DATA:  Initial evaluation for acute stroke.  EXAM: MRA HEAD WITHOUT CONTRAST  TECHNIQUE: Angiographic images of the Circle of Willis were obtained using MRA technique without intravenous contrast.  COMPARISON:  Prior MRI performed earlier the same day.  FINDINGS: ANTERIOR CIRCULATION:  Visualized distal cervical segments of the internal carotid arteries are widely patent with antegrade flow. The petrous, cavernous, and supraclinoid segments are widely patent bilaterally. There is a somewhat wide necked aneurysm measuring approximately 9 x 6 x 8 mm arising from the cavernous left ICA (series 3, image 62). This is directed inferiorly and slightly medially.  A1 segments within normal limits. Apparent linear posteriorly directed outpouching in the region of the anterior communicating artery favored to reflect a normal genu rather than aneurysm. Anterior cerebral arteries well opacified.  M1 segments widely patent without significant stenosis or occlusion. MCA bifurcations within normal limits. Distal MCA branches symmetric and well opacified bilaterally.  POSTERIOR CIRCULATION:  Vertebral arteries are widely patent to the vertebrobasilar junction. Left posterior inferior cerebellar  artery is patent. Right posterior inferior cerebral artery not discretely visualized. Basilar artery widely patent. Superior cerebellar arteries well opacified bilaterally with minimal atheromatous irregularity. Both posterior cerebral arteries arise from the basilar artery and are widely patent to their distal aspects without stenosis or occlusion. Duplication of the left P1 segment noted. Small right posterior communicating artery present.  No aneurysm or vascular malformation within the posterior circulation.  IMPRESSION: 1. No proximal or large arterial branch occlusion identified within the intracranial circulation. 2. No hemodynamically  significant or correctable stenosis identified. In fact, there is relatively little atheromatous disease within the intracranial circulation for patient age. 3. 9 x 6 x 8 mm wide necked aneurysm arising from the cavernous left ICA. This is directed inferiorly and slightly medially.   Electronically Signed   By: Jeannine Boga M.D.   On: 01/25/2015 22:49   Dg Hip Unilat With Pelvis 2-3 Views Right  01/25/2015   CLINICAL DATA:  Acute right hip pain after fall yesterday. Initial encounter.  EXAM: DG HIP (WITH OR WITHOUT PELVIS) 2-3V RIGHT  COMPARISON:  None.  FINDINGS: There is no evidence of hip fracture or dislocation. There is no evidence of arthropathy or other focal bone abnormality.  IMPRESSION: Normal right hip.   Electronically Signed   By: Marijo Conception, M.D.   On: 01/25/2015 13:19    Tabitha Aloe, MD 01/26/2015, 10:14 AM PGY-3, [place] Intern pager: [phone], text pages welcome

## 2015-01-26 NOTE — Evaluation (Signed)
Speech Language Pathology Evaluation Patient Details Name: Tabitha Sanders MRN: [id number] DOB: 30-May-1926 Today's Date: 01/26/2015 Time: 5638-7564 SLP Time Calculation (min) (ACUTE ONLY): 21 min  Problem List:  Patient Active Problem List   Diagnosis Date Noted  . Cerebral infarction due to occlusion of other cerebral artery   . Benign paroxysmal positional vertigo   . Stroke 01/25/2015   Past Medical History:  Past Medical History  Diagnosis Date  . Hypertension   . Depression   . Anemia     iron deficiency anemia and pernicious  . Fibromyalgia    Past Surgical History:  Past Surgical History  Procedure Laterality Date  . Breast surgery      biopsy- bilateral- benign  . Eye surgery      bilateral cataract with lens implant  . Abdominal hysterectomy      complete  . Colonoscopy with propofol N/A 08/20/2014    Procedure: COLONOSCOPY WITH PROPOFOL;  Surgeon: Howell Rucks, MD;  Location: [facility];  Service: Endoscopy;  Laterality: N/A;   HPI:  Tabitha Sanders is a 81 y.o. female presenting with dizziness. Pt reports symptoms have been going on for at least the past week to 10 days ago. Yesterday dizziness was worse and caused her to fall, she states she did not strike her head. She continued to feel dizzy but did not come to the ED until her daughter saw her today and she told her about the fall. She denies any pain currently except for a small amount in her hip from the fall. She has not tried to do anything for the dizziness, but has not been driving as much because of it. She denies any similar symptoms prior to this started recently. She does not have any weakness, no changes in vision, no HA, no changes in hearing.  MRI showing Suspected punctate acute infarct in the posterior limb of the   Assessment / Plan / Recommendation Clinical Impression  Cognitive/linguistic evaluation was completed.  The patient scored a 27/30 on the Mini Mental State Exam.  Oral mechanism  exam did not identify any issues.  The patient's skills appeared to be functional.  No further ST appears to be warranted.      SLP Assessment  Patient does not need any further Speech Lanaguage Pathology Services    Follow Up Recommendations  None       Pertinent Vitals/Pain Pain Assessment: No/denies pain   SLP Goals  Patient/Family Stated Goal: None stated  SLP Evaluation Prior Functioning  Cognitive/Linguistic Baseline: Within functional limits Type of Home: House  Lives With: Spouse Available Help at Discharge: Family   Cognition  Overall Cognitive Status: Within Functional Limits for tasks assessed Arousal/Alertness: Awake/alert Orientation Level: Oriented X4 Attention: Sustained Sustained Attention: Appears intact Memory: Appears intact Awareness: Appears intact Safety/Judgment: Appears intact    Comprehension  Auditory Comprehension Overall Auditory Comprehension: Appears within functional limits for tasks assessed Commands: Within Functional Limits Conversation: Complex Reading Comprehension Reading Status: Within funtional limits    Expression Expression Primary Mode of Expression: Verbal Verbal Expression Overall Verbal Expression: Appears within functional limits for tasks assessed Initiation: No impairment Automatic Speech: Name;Social Response Level of Generative/Spontaneous Verbalization: Conversation Repetition: No impairment Naming: No impairment Pragmatics: No impairment Non-Verbal Means of Communication: Not applicable Written Expression Dominant Hand: Right Written Expression: Within Functional Limits   Oral / Motor Oral Motor/Sensory Function Overall Oral Motor/Sensory Function: Appears within functional limits for tasks assessed Labial ROM: Within Functional Limits Labial Symmetry: Within  Functional Limits Labial Strength: Within Functional Limits Lingual ROM: Within Functional Limits Lingual Symmetry: Within Functional Limits Lingual  Strength: Within Functional Limits Facial ROM: Within Functional Limits Facial Symmetry: Within Functional Limits Facial Strength: Within Functional Limits Mandible: Within Functional Limits Motor Speech Overall Motor Speech: Appears within functional limits for tasks assessed Respiration: Within functional limits Phonation: Normal Resonance: Within functional limits Articulation: Within functional limitis Intelligibility: Intelligible Motor Planning: Witnin functional limits Motor Speech Errors: Not applicable   GO     Lamar Sprinkles 01/26/2015, 12:43 PM  Shelly Flatten, [place], [place] [facility] SLP [phone]

## 2015-01-26 NOTE — Progress Notes (Signed)
PT Cancellation Note  Patient Details Name: Tabitha Sanders MRN: [id number] DOB: 09-30-1925   Cancelled Treatment:    Reason Eval/Treat Not Completed: Patient not medically ready Patient currently with bed rest orders, HOB flat. Will Follow-up when patient medically ready for PT evaluation.  Ellouise Newer 01/26/2015, 8:32 AM Elayne Snare, [place]

## 2015-01-26 NOTE — Care Management Note (Signed)
Case Management Note  Patient Details  Name: Tabitha Sanders MRN: [id number] Date of Birth: Mar 23, 1926  Subjective/Objective:                    Action/Plan:   Expected Discharge Date:                  Expected Discharge Plan:     In-House Referral:     Discharge planning Services     Post Acute Care Choice:    Choice offered to:     DME Arranged:    DME Agency:     HH Arranged:    [place] Agency:     Status of Service:     Medicare Important Message Given:   yes Date Medicare IM Given:   01/26/15 Medicare IM give by:   Frann Rider, RN, BSN Date Additional Medicare IM Given:    Additional Medicare Important Message give by:     If discussed at [place] of Stay Meetings, dates discussed:    Additional Comments:  [name], RN 01/26/2015, 2:52 PM

## 2015-01-26 NOTE — Evaluation (Signed)
Physical Therapy Evaluation Patient Details Name: Tabitha Sanders MRN: [id number] DOB: 1925-10-15 Today's Date: 01/26/2015   History of Present Illness  Tabitha Sanders is a 81 y.o. female presenting with vertigo and stroke . PMH is significant for HTN, anemia, fibromyalgia, Depression  Clinical Impression  Pt admitted with the above diagnosis. Pt currently with functional limitations due to the deficits listed below (see PT Problem List). Tolerated dynamic gait challenges with some loss of balance and instability but able to self correct, greatly improved with the use of a rolling walker for support. Pt specifically has difficulty with positional head turns while ambulating, and with narrow base of support during static balance activities. Husband available 24/7 as needed. Pt would greatly benefit from HHPT at discharge. Will progress and monitor functional status while admitted.  Follow Up Recommendations Home health PT;Supervision for mobility/OOB    Equipment Recommendations  None recommended by PT    Recommendations for Other Services       Precautions / Restrictions Precautions Precautions: Fall Restrictions Weight Bearing Restrictions: No      Mobility  Bed Mobility Overal bed mobility: Independent                Transfers Overall transfer level: Needs assistance Equipment used: None Transfers: Sit to/from Stand Sit to Stand: Supervision         General transfer comment: supervision for safety. No sway noted. Instructions to stand for 30 sec prior to attempting to ambulate due to fall at home after standing.  Ambulation/Gait Ambulation/Gait assistance: Min guard Ambulation Distance (Feet): 450 Feet Assistive device: None;Rolling walker (2 wheeled) Gait Pattern/deviations: Step-through pattern;Decreased stride length;Drifts right/left Gait velocity: decreased   General Gait Details: Patient with mild instability noted during dynamic gait tasks such as  vertical/horizontal head turns and variable speeds. Able to self correct. VC for awareness. Stability greatly improves with use of a rolling walker for support. Educated on proper use with this device.  Stairs Stairs: Yes Stairs assistance: Min guard Stair Management: No rails;Step to pattern;Forwards Number of Stairs: 1 (x6) General stair comments: Patient with instability stepping up with Rt foot not noticed when stepping up with Lt. Educated on safe sequencing and awareness. Close guard for safety. states husband can guard at home. Also verbalized understanding of RW use to navigate single step at home.  Wheelchair Mobility    Modified Rankin (Stroke Patients Only) Modified Rankin (Stroke Patients Only) Pre-Morbid Rankin Score: No symptoms Modified Rankin: Moderately severe disability     Balance Overall balance assessment: Needs assistance;History of Falls Sitting-balance support: No upper extremity supported;Feet supported Sitting balance-Leahy Scale: Normal     Standing balance support: No upper extremity supported Standing balance-Leahy Scale: Good   Single Leg Stance - Right Leg: 0 (Requires UE support) Single Leg Stance - Left Leg: 0 (Requires UE support) Tandem Stance - Right Leg: 10 (more unstable than tandem on Lt) Tandem Stance - Left Leg: 10 [place] - Eyes Opened: 30 [place] - Eyes Closed: 30                 Pertinent Vitals/Pain Pain Assessment: No/denies pain    Home Living Family/patient expects to be discharged to:: Private residence Living Arrangements: Spouse/significant other Available Help at Discharge: Family;Available 24 hours/day Type of Home: House Home Access: Stairs to enter Entrance Stairs-Rails: None Entrance Stairs-Number of Steps: 1 Home Layout: One level Home Equipment: Walker - 2 wheels;Cane - single point      Prior Function Level of  Independence: Independent               Hand Dominance   Dominant Hand: Right     Extremity/Trunk Assessment   Upper Extremity Assessment: Defer to OT evaluation           Lower Extremity Assessment: Overall WFL for tasks assessed         Communication   Communication: No difficulties  Cognition Arousal/Alertness: Awake/alert Behavior During Therapy: WFL for tasks assessed/performed Overall Cognitive Status: Within Functional Limits for tasks assessed                      General Comments General comments (skin integrity, edema, etc.): Reports hx of BPPV and complains of dizziness with head turns in bed. Assessed with dix hallpike test, negative bilaterally. Assessed horizontal canals, negative bilaterally. No symptoms provoked during therapy session. Spoke with daughter over phone per pt request, discussed findings and recommendations.    Exercises        Assessment/Plan    PT Assessment Patient needs continued PT services  PT Diagnosis Abnormality of gait;Difficulty walking   PT Problem List Decreased activity tolerance;Decreased balance;Decreased mobility;Decreased knowledge of use of DME  PT Treatment Interventions DME instruction;Gait training;Stair training;Functional mobility training;Therapeutic activities;Therapeutic exercise;Balance training;Patient/family education   PT Goals (Current goals can be found in the Care Plan section) Acute Rehab PT Goals Patient Stated Goal: Go home PT Goal Formulation: With patient/family Time For Goal Achievement: 02/09/15 Potential to Achieve Goals: Good    Frequency Min 4X/week   Barriers to discharge        Co-evaluation               End of Session Equipment Utilized During Treatment: Gait belt Activity Tolerance: Patient tolerated treatment well Patient left: in chair;with call bell/phone within reach;with chair alarm set Nurse Communication: Mobility status         Time: 1810-1849 PT Time Calculation (min) (ACUTE ONLY): 39 min   Charges:   PT Evaluation $Initial PT  Evaluation Tier I: 1 Procedure PT Treatments $Gait Training: 8-22 mins $Therapeutic Activity: 8-22 mins   PT G CodesEllouise Newer 01/26/2015, 7:07 PM Camille Bal [place], [place]

## 2015-01-26 NOTE — Progress Notes (Signed)
STROKE TEAM PROGRESS NOTE   HISTORY Tabitha Sanders is an 81 y.o. female with a history of hypertension who has been complaining of dizziness with standing for the past few weeks. She states that his been getting gradually better over the past 10 days. She had an MRI which revealed small area of infarct and therefore she has been admitted for further evaluation.   LKW: 10 days ago tpa given?: no, out of window   SUBJECTIVE (INTERVAL HISTORY) Her friend is at the bedside.  Overall she feels her condition is rapidly improving.    OBJECTIVE Temp:  [98.1 F (36.7 C)-99.3 F (37.4 C)] 99.3 F (37.4 C) (08/13 0518) Pulse Rate:  [52-70] 53 (08/13 0518) Cardiac Rhythm:  [-]  Resp:  [10-18] 12 (08/13 0518) BP: (125-176)/(51-67) 125/52 mmHg (08/13 0518) SpO2:  [95 %-98 %] 95 % (08/13 0518) Weight:  [75.297 kg (166 lb)-75.7 kg (166 lb 14.2 oz)] 75.7 kg (166 lb 14.2 oz) (08/12 1920)  No results for input(s): GLUCAP in the last 168 hours.  Recent Labs Lab 01/25/15 1342 01/25/15 1346  NA 141 140  K 4.2 4.0  CL 108 105  CO2 24  --   GLUCOSE 114* 110*  BUN 16 18  CREATININE 1.11* 1.00  CALCIUM 10.4*  --     Recent Labs Lab 01/25/15 1342  AST 18  ALT 13*  ALKPHOS 68  BILITOT 0.9  PROT 6.7  ALBUMIN 3.9    Recent Labs Lab 01/25/15 1342 01/25/15 1346  WBC 9.7  --   NEUTROABS 8.0*  --   HGB 12.5 12.2  HCT 35.5* 36.0  MCV 90.3  --   PLT 196  --    No results for input(s): CKTOTAL, CKMB, CKMBINDEX, TROPONINI in the last 168 hours.  Recent Labs  01/25/15 1342  LABPROT 13.3  INR 0.99    Recent Labs  01/25/15 1454  COLORURINE YELLOW  LABSPEC 1.014  PHURINE 7.0  GLUCOSEU NEGATIVE  HGBUR NEGATIVE  BILIRUBINUR NEGATIVE  KETONESUR NEGATIVE  PROTEINUR NEGATIVE  UROBILINOGEN 0.2  NITRITE NEGATIVE  LEUKOCYTESUR TRACE*       Component Value Date/Time   CHOL 186 01/26/2015 0557   TRIG 351* 01/26/2015 0557   HDL 30* 01/26/2015 0557   CHOLHDL 6.2 01/26/2015  0557   VLDL 70* 01/26/2015 0557   LDLCALC 86 01/26/2015 0557   No results found for: HGBA1C    Component Value Date/Time   LABOPIA NONE DETECTED 01/25/2015 1500   COCAINSCRNUR NONE DETECTED 01/25/2015 1500   LABBENZ NONE DETECTED 01/25/2015 1500   AMPHETMU NONE DETECTED 01/25/2015 1500   THCU NONE DETECTED 01/25/2015 1500   LABBARB NONE DETECTED 01/25/2015 1500     Recent Labs Lab 01/25/15 1343  ETH <5     IMAGING   Ct Head Wo Contrast 01/25/2015    No acute intracranial abnormality.     Mr Brain Wo Contrast 01/25/2015    1. Suspected punctate acute infarct in the posterior limb of the right internal capsule.  2. Mild chronic small vessel ischemic disease in the cerebral white matter.      Mr Jodene Nam Head/brain Wo Cm 01/25/2015    1. No proximal or large arterial branch occlusion identified within the intracranial circulation.  2. No hemodynamically significant or correctable stenosis identified. In fact, there is relatively little atheromatous disease within the intracranial circulation for patient age. 3. 9 x 6 x 8 mm wide necked aneurysm arising from the cavernous left ICA. This is directed  inferiorly and slightly medially.     Dg Hip Unilat With Pelvis 2-3 Views Right 01/25/2015    Normal right hip.      PHYSICAL EXAM  GENERAL: Pleasant in no acute distress.  HEENT: Normal  ABDOMEN: soft  EXTREMITIES: No edema   BACK:Normal  SKIN: Normal by inspection.    MENTAL STATUS: Alert and oriented. Speech, language and cognition are generally intact. Judgment and insight normal.   CRANIAL NERVES: Pupils are equal, round and reactive to light and accomodation; extra ocular movements are full, there is no significant nystagmus; visual fields are full; upper and lower facial muscles are normal in strength and symmetric, there is no flattening of the nasolabial folds; tongue is midline; uvula is midline; shoulder elevation is normal.  MOTOR: Normal tone, bulk and  strength; no pronator drift.  COORDINATION: Left finger to nose is normal, right finger to nose is normal, No rest tremor; no intention tremor; no postural tremor; no bradykinesia. Heel-to-shin is also normal.  SENSATION: Normal to light touch.   Brain MRI scan is reviewed in person and shows a tiny increased signal seen posterior limb of the internal capsule on the right side on DWI. There is minimal white matter chronic ischemic changes noted.        ASSESSMENT/PLAN Tabitha Sanders is a 81 y.o. female with history of hypertension and fibromyalgia presenting with dizziness. She did not receive IV t-PA due to late presentation.   Suspected Stroke:  Non-dominant infarct small vessel disease source  Resultant  Dizziness and gait ataxia.  MRI - Suspected punctate acute infarct in the posterior limb of the right internal capsule.  MRA  no stenosis or occlusion.  3. 9 x 6 x 8 mm wide necked aneurysm arising from the cavernous left ICA.  Carotid Doppler pending  2D Echo  pending  LDL 86  HgbA1c pending  Lovenox for VTE prophylaxis  Diet Heart Room service appropriate?: Yes; Fluid consistency:: Thin  no antithrombotic prior to admission, now on no antithrombotic Start ASA 325 mg daily - ordered  Patient counseled to be compliant with her antithrombotic medications  Ongoing aggressive stroke risk factor management  Therapy recommendations: Pending  Disposition: Pending  Hypertension  Home meds: Norvasc, carvedilol, losartan and hydrochlorothiazide  Stable  Patient counseled to be compliant with her blood pressure medications  Hyperlipidemia  Home meds:  Lipitor 10 mg daily resumed in hospital  LDL 86, goal < 70  Lipitor increased to 40 mg daily  Continue statin at discharge  Diabetes  HgbA1c pending, goal < 7.0  No history of diabetes  Other Stroke Risk Factors  Advanced age  Obesity, Body mass index is 29.57 kg/(m^2).   Other Active  Problems  Wide necked aneurysm arising from the cavernous left ICA - consider consult Dr. Estanislado Pandy   Other Pertinent [facility] day # [place] PA-C [facility] Pager [phone] 01/26/2015, 12:59 PM     To contact Stroke Continuity provider, please refer to [email or web address] After hours, contact General Neurology

## 2015-01-27 ENCOUNTER — Encounter (HOSPITAL_COMMUNITY): Payer: Medicare Other

## 2015-01-27 ENCOUNTER — Inpatient Hospital Stay (HOSPITAL_COMMUNITY): Payer: Medicare Other

## 2015-01-27 DIAGNOSIS — I671 Cerebral aneurysm, nonruptured: Secondary | ICD-10-CM

## 2015-01-27 DIAGNOSIS — I639 Cerebral infarction, unspecified: Secondary | ICD-10-CM

## 2015-01-27 MED ORDER — ASPIRIN 325 MG PO TBEC: 325.0000 mg | DELAYED_RELEASE_TABLET | Freq: Every day | ORAL | Status: DC

## 2015-01-27 MED ORDER — ATORVASTATIN CALCIUM 40 MG PO TABS: 40.0000 mg | ORAL_TABLET | Freq: Every day | ORAL | Status: DC

## 2015-01-27 NOTE — Progress Notes (Signed)
Family Medicine Teaching Service Daily Progress Note Intern Pager: [phone]  Patient name: Tabitha Sanders Medical record number: [id number] Date of birth: 12-19-1925 Age: 81 y.o. Gender: female  Primary Care Provider: Mathews Argyle, MD Consultants: Neurology Code Status: Full code  Pt Overview and Major Events to Date:  8/13: MRA: medium sized aneurysm   Assessment and Plan: Tabitha Sanders is a 81 y.o. female presenting with vertigo and stroke . PMH is significant for HTN, anemia, fibromyalgia, Depression  # Right Posterior limb internal capsule infarct/ L ICA aneurysm:  - telemetry -s/p cho - f/u  carotid duplex - neuro checks - risk strat labs: a1c pending - antiplatelet: daily ASA - S/p PT eval- rec home health pt  # Hypertriglyceridemia- Triglyceride 351 on lipid panel - Per neuro Lipitor 40 started- will encourage continuation discussion with PCP   # HTN: blood pressures stable - permissive htn overnight - losartan-hctz, amlodipine and carvedilol still held, will restart on discharge  # Hip pain secondary to fall: x-rays negative for fracture in ED - tylenol PRN  # Depression: stable - continue citalopram  # PMR - continue prednisone 2.5mg  QOD  FEN/GI: heart healthy / saline lock Prophylaxis: lovenox sq  Disposition: Home pending completion of stroke work up  Subjective:  Pt reports improved dizziness, even with ambulation to restroom. However, still has some. Denies weakness, chest pain/SOB  Objective: Temp:  [98.1 F (36.7 C)-99.8 F (37.7 C)] 98.1 F (36.7 C) (08/14 0555) Pulse Rate:  [50-62] 50 (08/14 0555) Resp:  [14-15] 15 (08/14 0555) BP: (136-158)/(53-66) 138/60 mmHg (08/14 0555) SpO2:  [96 %-98 %] 98 % (08/14 0555)  Physical Exam: General: NAD, well apearing Cardiovascular: RRR, no murmur Respiratory: CTAB Abdomen: Soft, non-tender, non-distended, + BS Extremities: No tenderness of edema Cranial Nerves II - XII - II - Visual  field intact  III, IV, VI - Extraocular movements intact. V - Facial sensation intact bilaterally. VII - No facial droop VIII - Hearing intact bilaterally X - Palate elevates symmetrically, no dysarthria. XI - Shoulder shrug intact bilaterally. XII - Tongue protrusion intact and midline. Motor Strength - The patient's strength was 5/5 in upper extremities and 4/5 in lower extremities bilaterally. Bulk was normal and fasciculations were absent.  Motor Tone - Muscle tone was assessed at the neck and appendages and was normal. Coordination - The patient had normal movements in the hands with no ataxia or dysmetria. Tremor was absent.  Laboratory:  Recent Labs Lab 01/25/15 1342 01/25/15 1346  WBC 9.7  --   HGB 12.5 12.2  HCT 35.5* 36.0  PLT 196  --     Recent Labs Lab 01/25/15 1342 01/25/15 1346  NA 141 140  K 4.2 4.0  CL 108 105  CO2 24  --   BUN 16 18  CREATININE 1.11* 1.00  CALCIUM 10.4*  --   PROT 6.7  --   BILITOT 0.9  --   ALKPHOS 68  --   ALT 13*  --   AST 18  --   GLUCOSE 114* 110*   Risk Stratification Labs  Hemoglobin A1C No results found for: HGBA1C   Lipid Panel     Component Value Date/Time   CHOL 186 01/26/2015 0557   TRIG 351* 01/26/2015 0557   HDL 30* 01/26/2015 0557   CHOLHDL 6.2 01/26/2015 0557   VLDL 70* 01/26/2015 0557   LDLCALC 86 01/26/2015 0557     Imaging/Diagnostic Tests: 8/12  MRA brain IMPRESSION: 1. No proximal  or large arterial branch occlusion identified within the intracranial circulation. 2. No hemodynamically significant or correctable stenosis identified. In fact, there is relatively little atheromatous disease within the intracranial circulation for patient age. 3. 9 x 6 x 8 mm wide necked aneurysm arising from the cavernous left ICA. This is directed inferiorly and slightly medially.  MRI Brain  IMPRESSION: 1. Suspected punctate acute infarct in the posterior limb of the right internal capsule. 2. Mild  chronic small vessel ischemic disease in the cerebral white Matter.   Tabitha Bourbon, MD 01/27/2015, 9:52 AM PGY-2, [place] Intern pager: [phone], text pages welcome

## 2015-01-27 NOTE — Evaluation (Signed)
Occupational Therapy Evaluation Patient Details Name: Tabitha Sanders MRN: [id number] DOB: 07/06/1925 Today's Date: 01/27/2015    History of Present Illness Tabitha Sanders is a 81 y.o. female presenting with vertigo and stroke . PMH is significant for HTN, anemia, fibromyalgia, Depression   Clinical Impression   Pt ready for DC today - will need HHOT to continue working toward I    Follow Up Recommendations  Home health OT    Equipment Recommendations  Tub/shower seat;Other (comment) (daugther mayhave one- pt is checking ( tub seat))    Recommendations for Other Services       Precautions / Restrictions Precautions Precautions: Fall Restrictions Weight Bearing Restrictions: No      Mobility Bed Mobility Overal bed mobility: Independent                Transfers Overall transfer level: Needs assistance Equipment used: None Transfers: Sit to/from Stand Sit to Stand: Supervision         General transfer comment: Supervision for safety. Held bed initially for support however able to let go without sway.    Balance                                            ADL Overall ADL's : Needs assistance/impaired                                 Tub/ Shower Transfer: Supervision/safety;Grab bars Clinical cytogeneticist Details (indicate cue type and reason): pt willl need aa tub seat Functional mobility during ADLs: Supervision/safety General ADL Comments: pt overall S with ADL activity with walker. Pt did report dizziness with quick head turns. Educated pt to pick something and focus on to stablize gaze.  Reccomend HH OT     Vision Vision Assessment?: Yes Eye Alignment: Within Functional Limits Ocular Range of Motion: Within Functional Limits Alignment/Gaze Preference: Within Defined Limits Tracking/Visual Pursuits: Able to track stimulus in all quads without difficulty Saccades: Within functional limits Convergence: Within  functional limits Visual Fields: No apparent deficits   Perception     Praxis      Pertinent Vitals/Pain Pain Assessment: No/denies pain Pain Score:  ("hip is sore" no value given) Pain Location: Rt hip Pain Descriptors / Indicators: Sore Pain Intervention(s): Monitored during session;Repositioned     Hand Dominance Right   Extremity/Trunk Assessment Upper Extremity Assessment Upper Extremity Assessment: Overall WFL for tasks assessed           Communication Communication Communication: No difficulties   Cognition Arousal/Alertness: Awake/alert Behavior During Therapy: WFL for tasks assessed/performed Overall Cognitive Status: Within Functional Limits for tasks assessed                     General Comments       Exercises       Shoulder Instructions      Home Living Family/patient expects to be discharged to:: Private residence Living Arrangements: Spouse/significant other Available Help at Discharge: Family;Available 24 hours/day Type of Home: House Home Access: Stairs to enter [employer] of Steps: 1 Entrance Stairs-Rails: None Home Layout: One level     Bathroom Shower/Tub: Teacher, early years/pre: Standard     Home Equipment: Environmental consultant - 2 wheels;Kasandra Knudsen - single point      Lives With: Spouse  Prior Functioning/Environment Level of Independence: Independent             OT Diagnosis: Generalized weakness   OT Problem List: Decreased strength;Decreased activity tolerance   OT Treatment/Interventions:      OT Goals(Current goals can be found in the care plan section) Acute Rehab OT Goals Patient Stated Goal: Go home  OT Frequency:     Barriers to D/C:            Co-evaluation              End of Session Equipment Utilized During Treatment: Rolling walker Nurse Communication: Mobility status  Activity Tolerance: Patient tolerated treatment well Patient left: in chair   Time: 1050-1115 OT Time  Calculation (min): 25 min Charges:  OT General Charges $OT Visit: 1 Procedure OT Evaluation $Initial OT Evaluation Tier I: 1 Procedure G-Codes:    [name] February 08, 2015, 11:24 AM

## 2015-01-27 NOTE — Discharge Instructions (Signed)
°  Please follow up with the following providers  If your symptoms worsen please call your primary care provider or present to the emergency department   Follow-up Information    Follow up with [facility]   Why:  home health physical therapy   Contact information:   [address] [place] [address] [phone]       Follow up with SETHI,PRAMOD, MD. Schedule an appointment as soon as possible for a visit in 2 months.   Specialties:  Neurology, Radiology   Contact information:   [address] [place] [address] [phone]       Schedule an appointment as soon as possible for a visit with Mathews Argyle, MD.   Specialty:  Internal Medicine   Contact information:   [address] [place]  [address] [phone]       Follow up with DEVESHWAR, Fritz Pickerel, MD. Call in 1 day.   Specialty:  Interventional Radiology   Why:  Call [phone] Monday Anderson [place]) for consult appointment.   Contact information:   [place] [address] [place] [address] [phone]

## 2015-01-27 NOTE — Progress Notes (Signed)
Pt needs HHOT as well as HHPT- thanks! Kari Baars, [place], [place]

## 2015-01-27 NOTE — Progress Notes (Signed)
VASCULAR LAB PRELIMINARY  PRELIMINARY  PRELIMINARY  PRELIMINARY  Carotid Duplex Completed. No evidence of stenosis in the bilateral ICAs.  Bilateral vertebral arteries demonstrate normal antegrade flow.   Alla German, RVT 01/27/2015, 2:58 PM

## 2015-01-27 NOTE — Progress Notes (Signed)
Pt for discharge home today. Discharge orders received. IV removed. Discharge instructions and handouts given. Stroke education given. (2) prescriptions called into pharmacy. Family at bedside to assist with discharge. Staff brought patient to lobby via wheelchair at 1708. Transported to home by family member.

## 2015-01-27 NOTE — Care Management Note (Addendum)
Case Management Note  Patient Details  Name: Tabitha Sanders MRN: [id number] Date of Birth: Apr 26, 1926  Subjective/Objective:                   vertigo and stroke  Action/Plan: Discharge planning  Expected Discharge Date:  01/27/15               Expected Discharge Plan:  [place]  In-House Referral:     Discharge planning Services  CM Consult  Post Acute Care Choice:  Home Health Choice offered to:  Patient  DME Arranged:    DME Agency:     HH Arranged:  PT [place]:  [place]  Status of Service:  Completed, signed off  Medicare Important Message Given:    Date Medicare IM Given:    Medicare IM give by:    Date Additional Medicare IM Given:    Additional Medicare Important Message give by:     If discussed at [place] of Stay Meetings, dates discussed:    Additional Comments: 11:18 CM received call from OT, Margarita Grizzle requesting shower stool for pt.  CM confirmed with [facility] DME rep, Jeneen Rinks, this is not covered by pt's insurance.  CM called pt to notify her the cost of the shower stool provided by [facility] would be 38.00.  Pt states she does not want the shower stool.  No other CM needs were communicated.  CM met with pt in room to offer choice of home health agency.  Pt chooses [facility] to render HHPT.  Address and cotact information verified by pt.  Referral called to [facility] rep, Tiffany.  Pt has rolling walker at home.  No other Cm needs were communicated. Dellie Catholic, RN 01/27/2015, 10:39 AM

## 2015-01-27 NOTE — Progress Notes (Signed)
Physical Therapy Treatment Patient Details Name: Tabitha Sanders MRN: [id number] DOB: 06/28/1925 Today's Date: 01/27/2015    History of Present Illness Tabitha Sanders is a 81 y.o. female presenting with vertigo and stroke . PMH is significant for HTN, anemia, fibromyalgia, Depression    PT Comments    Good progress. Still with some instability with dynamic and static balance tasks. Did not require physical assist during therapy session. Focused on higher level balance training. Ambulatory stability and confidence improves with RW support. Adequate for d/c from PT standpoint when medically ready.  Follow Up Recommendations  Home health PT;Supervision for mobility/OOB     Equipment Recommendations  None recommended by PT    Recommendations for Other Services       Precautions / Restrictions Precautions Precautions: Fall Restrictions Weight Bearing Restrictions: No    Mobility  Bed Mobility Overal bed mobility: Independent                Transfers Overall transfer level: Needs assistance Equipment used: None Transfers: Sit to/from Stand Sit to Stand: Supervision         General transfer comment: Supervision for safety. Held bed initially for support however able to let go without sway.  Ambulation/Gait Ambulation/Gait assistance: Supervision Ambulation Distance (Feet): 575 Feet Assistive device: None;Rolling walker (2 wheeled) Gait Pattern/deviations: Step-through pattern;Decreased stride length;Drifts right/left Gait velocity: decreased   General Gait Details: Still with mild instability. Focused on higher level dynamic gait challenges including, side stepping, backwards steping, high marching, variable speeds, vertical/horizontal head turns. All performed without need for physical assist but notable drift with these tasks. Stability and confidence improves with RW.   Stairs Stairs: Yes Stairs assistance: Supervision Stair Management: No rails;Step to  pattern;Backwards;With walker Number of Stairs: 1 General stair comments: Practiced with use of RW, and pt performed safely after demonstration.  Wheelchair Mobility    Modified Rankin (Stroke Patients Only) Modified Rankin (Stroke Patients Only) Pre-Morbid Rankin Score: No symptoms Modified Rankin: Moderately severe disability     Balance                                    Cognition Arousal/Alertness: Awake/alert Behavior During Therapy: WFL for tasks assessed/performed Overall Cognitive Status: Within Functional Limits for tasks assessed                      Exercises      General Comments General comments (skin integrity, edema, etc.): performed toe taps x10 on stairs for balance training, with and without UE support      Pertinent Vitals/Pain Pain Assessment: 0-10 Pain Score:  ("hip is sore" no value given) Pain Location: Rt hip Pain Descriptors / Indicators: Sore Pain Intervention(s): Monitored during session;Repositioned    Home Living                      Prior Function            PT Goals (current goals can now be found in the care plan section) Acute Rehab PT Goals Patient Stated Goal: Go home PT Goal Formulation: With patient/family Time For Goal Achievement: 02/09/15 Potential to Achieve Goals: Good Progress towards PT goals: Progressing toward goals    Frequency  Min 4X/week    PT Plan Current plan remains appropriate    Co-evaluation             End  of Session Equipment Utilized During Treatment: Gait belt Activity Tolerance: Patient tolerated treatment well Patient left: in chair;with call bell/phone within reach;with chair alarm set     Time: 0912-0931 PT Time Calculation (min) (ACUTE ONLY): 19 min  Charges:  $Gait Training: 8-22 mins                    G Codes:      Ellouise Newer 2015/02/02, 10:04 AM Elayne Snare, [place]

## 2015-01-27 NOTE — Discharge Summary (Signed)
[facility] Discharge Summary  Patient name: Tabitha Sanders Medical record number: [id number] Date of birth: March 30, 1926 Age: 81 y.o. Gender: female Date of Admission: 01/25/2015  Date of Discharge: 01/27/2015  Admitting Physician: Alveda Reasons, MD  Primary Care Provider: Mathews Argyle, MD Consultants: Neurology  Indication for Hospitalization:  Dizziness  Discharge Diagnoses/Problem List:  Patient Active Problem List   Diagnosis Date Noted  . Cerebral infarction due to occlusion of other cerebral artery   . Benign paroxysmal positional vertigo   . Stroke 01/25/2015      Disposition: Home with Home health Physical and Occupational theraapy  Discharge Condition: Stable  Discharge Exam:   Temp: [98.1 F (36.7 C)-99.8 F (37.7 C)] 98.1 F (36.7 C) (08/14 0555) Pulse Rate: [50-62] 50 (08/14 0555) Resp: [14-15] 15 (08/14 0555) BP: (136-158)/(53-66) 138/60 mmHg (08/14 0555) SpO2: [96 %-98 %] 98 % (08/14 0555)  Physical Exam: General: NAD, well apearing Cardiovascular: RRR, no murmur Respiratory: CTAB Abdomen: Soft, non-tender, non-distended, + BS Extremities: No tenderness of edema Cranial Nerves II - XII - II - Visual field intact  III, IV, VI - Extraocular movements intact. V - Facial sensation intact bilaterally. VII - No facial droop VIII - Hearing intact bilaterally X - Palate elevates symmetrically, no dysarthria. XI - Shoulder shrug intact bilaterally. XII - Tongue protrusion intact and midline. Motor Strength - The patient's strength was 5/5 in upper extremities and 4/5 in lower extremities bilaterally. Bulk was normal and fasciculations were absent.  Motor Tone - Muscle tone was assessed at the neck and appendages and was normal. Coordination - The patient had normal movements in the hands with no ataxia or dysmetria. Tremor was absent.  Brief Hospital Course:  Tabitha Sanders is a 81 y.o. female  presenting with vertigo and for the presenting several weeks with fall the day prior to presentation  On presentation to the Emergency department she had a CT head which was negative for pathology but MRI was significant for a posterior limb ICA infarct. She was also noted on MRA to have left cavernous wide necked artery aneurysm. Neurology was consulted and followed. Echocardiogram was normal showing no thrombus to cause her cerebral infarct. Carotid dopplers showed no evidence of stenosis bilaterally and had normal anterograde flow. Risk stratification labs were significant for hypertriglyceridemia.  Hgb A1c was pending at time of discharge. She was started on lipitor 40 mg daily as well as aspirin 325 mg daily For her aneurysm the decision was made to discuss further therapy as an outpatient and follow with Dr Estanislado Pandy for potential intervention She was stable by day of discharge with improved dizziness Physical and Occupational therapy were consulted with the recommendation to continue home health physical and occupational therapy. These were ordered  Issues for Follow Up:  1. Aneurysm management 2. Consideration of daily lipitor 3. Pending HgA1c  Significant Procedures:  Echocardiogram MRI/MRA brain Carotid dopplers  Significant Labs and Imaging:   Recent Labs Lab 01/25/15 1342 01/25/15 1346  WBC 9.7  --   HGB 12.5 12.2  HCT 35.5* 36.0  PLT 196  --     Recent Labs Lab 01/25/15 1342 01/25/15 1346  NA 141 140  K 4.2 4.0  CL 108 105  CO2 24  --   GLUCOSE 114* 110*  BUN 16 18  CREATININE 1.11* 1.00  CALCIUM 10.4*  --   ALKPHOS 68  --   AST 18  --   ALT 13*  --  ALBUMIN 3.9  --       Results/Tests Pending at Time of Discharge:  Hgb A1c  Discharge Medications:    Medication List    ASK your doctor about these medications        amLODipine 10 MG tablet  Commonly known as:  NORVASC  Take 10 mg by mouth daily.     atorvastatin 10 MG tablet  Commonly known  as:  LIPITOR  Take 10 mg by mouth at bedtime.     carvedilol 25 MG tablet  Commonly known as:  COREG  Take 12.5 mg by mouth 2 (two) times daily with a meal.     citalopram 20 MG tablet  Commonly known as:  CELEXA  Take 20 mg by mouth every morning.     ergocalciferol 50000 UNITS capsule  Commonly known as:  VITAMIN D2  Take 50,000 Units by mouth once a week. Friday     losartan-hydrochlorothiazide 100-25 MG per tablet  Commonly known as:  HYZAAR  Take 1 tablet by mouth every morning.     predniSONE 5 MG tablet  Commonly known as:  DELTASONE  Take 2.5 mg by mouth every other day.     PRESERVISION AREDS PO  Take 1 capsule by mouth 2 (two) times daily.     vitamin B-12 1000 MCG tablet  Commonly known as:  CYANOCOBALAMIN  Take 1,000 mcg by mouth every morning.        Discharge Instructions: Please refer to Patient Instructions section of EMR for full details.  Patient was counseled important signs and symptoms that should prompt return to medical care, changes in medications, dietary instructions, activity restrictions, and follow up appointments.   Follow-Up Appointments: Follow-up Information    Follow up with [facility].   Why:  home health physical therapy   Contact information:   [address] [place] [address] [phone]       Follow up with SETHI,PRAMOD, MD. Schedule an appointment as soon as possible for a visit in 2 months.   Specialties:  Neurology, Radiology   Contact information:   [address] [place] [address] [phone]       Schedule an appointment as soon as possible for a visit with Mathews Argyle, MD.   Specialty:  Internal Medicine   Contact information:   [address] [place] [address] [phone]       Alyssa A Haney, MD 01/27/2015, 1:48 PM PGY- [place]

## 2015-01-27 NOTE — Progress Notes (Signed)
STROKE TEAM PROGRESS NOTE   HISTORY Tabitha Sanders is an 81 y.o. female with a history of hypertension who has been complaining of dizziness with standing for the past few weeks. She states that his been getting gradually better over the past 10 days. She had an MRI which revealed small area of infarct and therefore she has been admitted for further evaluation.  LKW: 10 days ago tpa given?: no, out of window  SUBJECTIVE (INTERVAL HISTORY) The patient's daughter is at bedside. They report a strong family history of ruptured aneurysms and would like to have further evaluation of the aneurysm. The patient's daughter is a Marine scientist who previously worked with Dr. Estanislado Pandy. We contacted Dr. Estanislado Pandy at home today and he instructed the patient to call Monday for an appointment next week for a consultation.   OBJECTIVE Temp:  [98.1 F (36.7 C)-99.8 F (37.7 C)] 98.2 F (36.8 C) (08/14 0953) Pulse Rate:  [50-62] 53 (08/14 0953) Cardiac Rhythm:  [-] Sinus bradycardia (08/13 2000) Resp:  [14-16] 16 (08/14 0953) BP: (135-158)/(51-66) 135/51 mmHg (08/14 0953) SpO2:  [96 %-98 %] 98 % (08/14 0953)  No results for input(s): GLUCAP in the last 168 hours.  Recent Labs Lab 01/25/15 1342 01/25/15 1346  NA 141 140  K 4.2 4.0  CL 108 105  CO2 24  --   GLUCOSE 114* 110*  BUN 16 18  CREATININE 1.11* 1.00  CALCIUM 10.4*  --     Recent Labs Lab 01/25/15 1342  AST 18  ALT 13*  ALKPHOS 68  BILITOT 0.9  PROT 6.7  ALBUMIN 3.9    Recent Labs Lab 01/25/15 1342 01/25/15 1346  WBC 9.7  --   NEUTROABS 8.0*  --   HGB 12.5 12.2  HCT 35.5* 36.0  MCV 90.3  --   PLT 196  --    No results for input(s): CKTOTAL, CKMB, CKMBINDEX, TROPONINI in the last 168 hours.  Recent Labs  01/25/15 1342  LABPROT 13.3  INR 0.99    Recent Labs  01/25/15 1454  COLORURINE YELLOW  LABSPEC 1.014  PHURINE 7.0  GLUCOSEU NEGATIVE  HGBUR NEGATIVE  BILIRUBINUR NEGATIVE  KETONESUR NEGATIVE  PROTEINUR  NEGATIVE  UROBILINOGEN 0.2  NITRITE NEGATIVE  LEUKOCYTESUR TRACE*       Component Value Date/Time   CHOL 186 01/26/2015 0557   TRIG 351* 01/26/2015 0557   HDL 30* 01/26/2015 0557   CHOLHDL 6.2 01/26/2015 0557   VLDL 70* 01/26/2015 0557   LDLCALC 86 01/26/2015 0557   No results found for: HGBA1C    Component Value Date/Time   LABOPIA NONE DETECTED 01/25/2015 1500   COCAINSCRNUR NONE DETECTED 01/25/2015 1500   LABBENZ NONE DETECTED 01/25/2015 1500   AMPHETMU NONE DETECTED 01/25/2015 1500   THCU NONE DETECTED 01/25/2015 1500   LABBARB NONE DETECTED 01/25/2015 1500     Recent Labs Lab 01/25/15 1343  ETH <5     IMAGING Ct Head Wo Contrast 01/25/2015    No acute intracranial abnormality.    Mr Brain Wo Contrast 01/25/2015    1. Suspected punctate acute infarct in the posterior limb of the right internal capsule.  2. Mild chronic small vessel ischemic disease in the cerebral white matter.     Mr Jodene Nam Head/brain Wo Cm 01/25/2015    1. No proximal or large arterial branch occlusion identified within the intracranial circulation.  2. No hemodynamically significant or correctable stenosis identified. In fact, there is relatively little atheromatous disease within the intracranial circulation  for patient age. 3. 9 x 6 x 8 mm wide necked aneurysm arising from the cavernous left ICA. This is directed inferiorly and slightly medially.     Dg Hip Unilat With Pelvis 2-3 Views Right 01/25/2015    Normal right hip.      PHYSICAL EXAM  GENERAL: Pleasant in no acute distress. HEENT: Normal ABDOMEN: soft EXTREMITIES: No edema     MENTAL STATUS: Alert and oriented. Speech, language and cognition are generally intact. Judgment and insight normal.   CRANIAL NERVES: Pupils are equal, round and reactive to light and accomodation; extra ocular movements are full, there is no significant nystagmus; visual fields are full; upper and lower facial muscles are normal in strength and  symmetric, there is no flattening of the nasolabial folds; tongue is midline; uvula is midline; shoulder elevation is normal.  MOTOR: Normal tone, bulk and strength; no pronator drift.  COORDINATION: Left finger to nose is normal, right finger to nose is normal, No rest tremor; no intention tremor; no postural tremor; no bradykinesia. Heel-to-shin is also normal.  SENSATION: Normal to light touch.   ASSESSMENT Tabitha Sanders is a 81 y.o. female with history of hypertension and fibromyalgia presenting with dizziness. She did not receive IV t-PA due to late presentation.  Suspected Stroke:  Non-dominant infarct small vessel disease source  Resultant  Dizziness and gait ataxia.  MRI - Suspected punctate acute infarct in the posterior limb of the right internal capsule.  MRA  no stenosis or occlusion.  3. 9 x 6 x 8 mm wide necked aneurysm arising from the cavernous left ICA.  Carotid Doppler - No evidence of stenosis in the bilateral ICAs. Bilateral vertebral arteries demonstrate normal antegrade flow.   2D Echo  EF 65-70%. No cardiac source of emboli identified  LDL 86  HgbA1c pending  Lovenox for VTE prophylaxis Diet Heart Room service appropriate?: Yes; Fluid consistency:: Thin  no antithrombotic prior to admission, now on no antithrombotic Start ASA 325 mg daily - ordered  Patient counseled to be compliant with her antithrombotic medications  Ongoing aggressive stroke risk factor management  Therapy recommendations: Home health physical and occupational therapy  Disposition: Pending  Hypertension  Home meds: Norvasc, carvedilol, losartan and hydrochlorothiazide  Stable  Patient counseled to be compliant with her blood pressure medications  Hyperlipidemia  Home meds:  Lipitor 10 mg daily resumed in hospital  LDL 86, goal < 70  Lipitor increased to 40 mg daily  Continue statin at discharge  Diabetes  HgbA1c pending, goal < 7.0  No history of  diabetes  Other Stroke Risk Factors  Advanced age  Obesity, Body mass index is 29.57 kg/(m^2).     Other Pertinent History  Multiple family members with ruptured cerebral aneurysms.   Hospital day # 2  Mikey Bussing [facility] Pager [phone] 01/27/2015, 1:05 PM  NEUROLOGY ATTENDING NOTE Patient was seen and examined by me personally. I reviewed notes, independently viewed imaging studies, participated in medical decision making and plan of care. I have made additions or clarifications directly to the above note.  Documentation accurately reflects findings. The laboratory and radiographic studies were personally reviewed by me.  ROS completed by me personally and there were no pertinent positives  Assessment and plan completed by me personally and fully documented above.  Plans include:   Neuro:  Wide necked aneurysm arising from the cavernous left ICA - Family wants this addressed. Arrangements made to  have patient see Dr  Deveshwar in the office next week for consultation.   OK to D/c - Dopplers completed - no significant stenosis.  F/U Dr Leonie Man 2 months   Condition is improved    SIGNED BY: Dr. Elissa Hefty      To contact Stroke Continuity provider, please refer to [email or web address] After hours, contact General Neurology

## 2015-01-28 DIAGNOSIS — I671 Cerebral aneurysm, nonruptured: Secondary | ICD-10-CM | POA: Insufficient documentation

## 2015-01-28 LAB — HEMOGLOBIN A1C
HEMOGLOBIN A1C: 5.4 % (ref 4.8–5.6)
MEAN PLASMA GLUCOSE: 108 mg/dL

## 2015-01-29 ENCOUNTER — Other Ambulatory Visit (HOSPITAL_COMMUNITY): Payer: Self-pay | Admitting: Interventional Radiology

## 2015-01-29 DIAGNOSIS — I639 Cerebral infarction, unspecified: Secondary | ICD-10-CM

## 2015-02-12 ENCOUNTER — Ambulatory Visit (HOSPITAL_COMMUNITY)
Admission: RE | Admit: 2015-02-12 | Discharge: 2015-02-12 | Disposition: A | Discharge status: Home / Self Care | Payer: Medicare Other | Source: Ambulatory Visit | Attending: Interventional Radiology | Admitting: Interventional Radiology

## 2015-02-12 DIAGNOSIS — I639 Cerebral infarction, unspecified: Secondary | ICD-10-CM

## 2015-03-06 ENCOUNTER — Other Ambulatory Visit (HOSPITAL_COMMUNITY): Payer: Self-pay | Admitting: Interventional Radiology

## 2015-03-06 DIAGNOSIS — I10 Essential (primary) hypertension: Secondary | ICD-10-CM

## 2015-03-06 DIAGNOSIS — I771 Stricture of artery: Secondary | ICD-10-CM

## 2015-03-06 DIAGNOSIS — I639 Cerebral infarction, unspecified: Secondary | ICD-10-CM

## 2015-03-21 ENCOUNTER — Other Ambulatory Visit: Payer: Self-pay | Admitting: Physician Assistant

## 2015-03-25 ENCOUNTER — Encounter (HOSPITAL_COMMUNITY)
Admission: RE | Admit: 2015-03-25 | Discharge: 2015-03-25 | Disposition: A | Discharge status: Home / Self Care | Payer: Medicare Other | Source: Ambulatory Visit | Attending: Interventional Radiology | Admitting: Interventional Radiology

## 2015-03-25 ENCOUNTER — Encounter (HOSPITAL_COMMUNITY): Payer: Self-pay

## 2015-03-25 DIAGNOSIS — Z79899 Other long term (current) drug therapy: Secondary | ICD-10-CM | POA: Insufficient documentation

## 2015-03-25 DIAGNOSIS — Z01818 Encounter for other preprocedural examination: Secondary | ICD-10-CM | POA: Diagnosis present

## 2015-03-25 DIAGNOSIS — I671 Cerebral aneurysm, nonruptured: Secondary | ICD-10-CM | POA: Insufficient documentation

## 2015-03-25 DIAGNOSIS — Z01812 Encounter for preprocedural laboratory examination: Secondary | ICD-10-CM | POA: Insufficient documentation

## 2015-03-25 DIAGNOSIS — I10 Essential (primary) hypertension: Secondary | ICD-10-CM | POA: Insufficient documentation

## 2015-03-25 DIAGNOSIS — Z7902 Long term (current) use of antithrombotics/antiplatelets: Secondary | ICD-10-CM | POA: Diagnosis not present

## 2015-03-25 DIAGNOSIS — Z8673 Personal history of transient ischemic attack (TIA), and cerebral infarction without residual deficits: Secondary | ICD-10-CM | POA: Insufficient documentation

## 2015-03-25 DIAGNOSIS — Z7982 Long term (current) use of aspirin: Secondary | ICD-10-CM | POA: Insufficient documentation

## 2015-03-25 HISTORY — DX: Rheumatoid arthritis, unspecified: M06.9

## 2015-03-25 HISTORY — DX: Unspecified hearing loss, unspecified ear: H91.90

## 2015-03-25 HISTORY — DX: Cerebral infarction, unspecified: I63.9

## 2015-03-25 LAB — BASIC METABOLIC PANEL
ANION GAP: 8 (ref 5–15)
BUN: 18 mg/dL (ref 6–20)
CALCIUM: 10.1 mg/dL (ref 8.9–10.3)
CO2: 24 mmol/L (ref 22–32)
CREATININE: 1.24 mg/dL — AB (ref 0.44–1.00)
Chloride: 107 mmol/L (ref 101–111)
GFR, EST AFRICAN AMERICAN: 44 mL/min — AB (ref >60)
GFR, EST NON AFRICAN AMERICAN: 38 mL/min — AB (ref >60)
GLUCOSE: 98 mg/dL (ref 65–99)
Potassium: 4.1 mmol/L (ref 3.5–5.1)
Sodium: 139 mmol/L (ref 135–145)

## 2015-03-25 LAB — CBC
HCT: 34.5 % — ABNORMAL LOW (ref 36.0–46.0)
HEMOGLOBIN: 11.8 g/dL — AB (ref 12.0–15.0)
MCH: 31.3 pg (ref 26.0–34.0)
MCHC: 34.2 g/dL (ref 30.0–36.0)
MCV: 91.5 fL (ref 78.0–100.0)
PLATELETS: 240 10*3/uL (ref 150–400)
RBC: 3.77 MIL/uL — ABNORMAL LOW (ref 3.87–5.11)
RDW: 13.2 % (ref 11.5–15.5)
WBC: 7.1 10*3/uL (ref 4.0–10.5)

## 2015-03-25 LAB — PROTIME-INR
INR: 1.02 (ref 0.00–1.49)
PROTHROMBIN TIME: 13.6 s (ref 11.6–15.2)

## 2015-03-25 LAB — APTT: APTT: 28 s (ref 24–37)

## 2015-03-25 NOTE — Pre-Procedure Instructions (Signed)
ILAMAE GENG  03/25/2015     Your procedure is scheduled on : Monday April 01, 2015 at 8:30 AM.  Report to [facility] Admitting at 6:30 A.M.  Call this number if you have problems the morning of surgery: [phone]    Remember:  Do not eat food or drink liquids after midnight.  Take these medicines the morning of surgery with A SIP OF WATER : Amlodipine (Norvasc), Carvedilol (Coreg), Citalopram (Celexa), Plavix, and Aspirin   Stop taking any vitamins, herbal medications, Ibuprofen, Advil, Motrin, Goodys, BC Powder, etc    Do not wear jewelry, make-up or nail polish.  Do not wear lotions, powders, or perfumes.   Do not shave 48 hours prior to surgery.    Do not bring valuables to the hospital.  [facility] is not responsible for any belongings or valuables.  Contacts, dentures or bridgework may not be worn into surgery.  Leave your suitcase in the car.  After surgery it may be brought to your room.  For patients admitted to the hospital, discharge time will be determined by your treatment team.  Patients discharged the day of surgery will not be allowed to drive home.   Name and phone number of your driver:    Special instructions:  Shower using CHG soap the night before and the morning of your surgery Please read over the following fact sheets that you were given. Pain Booklet, Coughing and Deep Breathing and Surgical Site Infection Prevention

## 2015-03-25 NOTE — Progress Notes (Signed)
PCP is Hal Stoneking  Patient denied having any acute cardiac or pulmonary issues  Patient informed Nurse that she started taking Plavix today, therefore P2y12 will be drawn DOS.   Nurse called Jannifer Franklin, PA to make sure that it was OK for patient to take Plavix and aspirin DOS (as it was not in orders), and [name] stated "yes" it was. Patient informed of this, and verbalized understanding  Patients daughter at chairside during PAT visit.

## 2015-03-26 ENCOUNTER — Encounter (HOSPITAL_COMMUNITY): Payer: Self-pay | Admitting: Emergency Medicine

## 2015-03-26 NOTE — Progress Notes (Signed)
Anesthesia Chart Review:  Pt is 81 year old female scheduled for endovascular treatment of ICA aneurysm on 04/01/2015 with Dr. Estanislado Pandy.   PMH includes: HTN, stroke (01/25/15), anemia. Hard of hearing. Never smoker. BMI 29.   Medications include: amlodipine, ASA, lipitor, carvedilol, plavix, losartan-hctz, prednisone.   Preoperative labs reviewed.    EKG 01/25/2015: Sinus rhythm. Abnormal R-wave progression, early transition. Repol abnrm suggests ischemia, anterolateral. Minimal ST elevation, inferior leads. Appears unchanged when compared to prior tracing dated 08/24/07  Carotid duplex US 01/27/2015: -- No significant extracranial carotid artery stenosis demonstrated -- 1-39% category. Vertebrals are patent with antegrade flow.  Echo 01/26/2015: - Left ventricle: The cavity size was normal. Systolic function was vigorous. The estimated ejection fraction was in the range of 65% to 70%. Wall motion was normal; there were no regional wall motion abnormalities. - Aortic valve: Transvalvular velocity was minimally increased (likely secondary to hyperdynamic outflow. There was no stenosis. - Impressions: No cardiac source of emboli was indentified.  Pt has f/u appt with neuro Leonie Man) 03/27/15.   Reviewed case with Dr. Gifford Shave.   If no changes, I anticipate pt can proceed with surgery as scheduled.   Willeen Cass, FNP-BC [facility]/Anesthesiology Phone: [phone] 03/26/2015 4:38 PM

## 2015-03-27 ENCOUNTER — Encounter: Payer: Self-pay | Admitting: Neurology

## 2015-03-27 ENCOUNTER — Ambulatory Visit (INDEPENDENT_AMBULATORY_CARE_PROVIDER_SITE_OTHER): Payer: Medicare Other | Admitting: Neurology

## 2015-03-27 VITALS — BP 139/65 | HR 50 | Ht 62.0 in | Wt 161.0 lb

## 2015-03-27 DIAGNOSIS — I671 Cerebral aneurysm, nonruptured: Secondary | ICD-10-CM

## 2015-03-27 NOTE — Progress Notes (Signed)
[facility] [address] [place] [address] [phone]       OFFICE CONSULT NOTE  [name] Date of Birth:  January 16, 1926 Medical Record Number:  [id number]   Referring MD:  Dr Estanislado Pandy  Reason for Referral:  Stroke and aneurysm  HPI: Ms Tabitha Sanders is a pleasant 81 year old Caucasian lady who had a transient episode of being off balance and gait ataxia on 01/25/15.  She also had transient nausea and vertigo.She was admitted for evaluation and MRI scan showed a tiny right punctate internal capsule infarct. Also MRA showed  9 x 6 x 8 mm wide neck aneurysm arising from the left cavernous carotid directly inferiorly and slightly medially. There was also a smaller associated outpouching of 3.5 mm in the right posterior communicating artery region which could be aneurysm or infundibulum. There was no other large vessel intracranial stenosis or occlusion. Carotid ultrasound and transthoracic echo were both unremarkable. LDL cholesterol was 86 mg percent with triglycerides 351 and total cholesterol 186. Hemoglobin A1c was 5.4. The patient has 2 sisters who had brain aneurysms requiring surgery and hence she is very anxious about risk of rupture, morbidity and mortality. She has already seen Dr. Estanislado Pandy mesh were on 8/ 30/16 for consultation and is planning to have elective aneurysm coiling and treatment soon. She has no prior history of ruptured brain aneurysm, intracerebral hemorrhage, fibromuscular dysplasia or any other connective tissue disorder. The patient does have hypertension and hyperlipidemia and feels they're under good control. Her blood pressure today is 139/65. She is currently on aspirin 325 mg daily which is tolerating well without bleeding or bruising. She had follow-up lipid profile checked earlier this week but the results of pending.  ROS:   14 system review of systems is positive for  Memory loss, confusion, depression, anxiety, decreased energy,  joint pain, hearing loss, murmur and all the systems negative  PMH:  Past Medical History  Diagnosis Date  . Hypertension   . Depression   . Anemia     iron deficiency anemia and pernicious  . Stroke [facility]) January 25, 2015    No deficits  . HOH (hard of hearing)     wears bilateral hearing aids  . Polyarthritis rheumatica (HCC)     Social History:  Social History   Social History  . Marital Status: Married    Spouse Name: N/A  . Number of Children: N/A  . Years of Education: N/A   Occupational History  . Not on file.   Social History Main Topics  . Smoking status: Never Smoker   . Smokeless tobacco: Not on file  . Alcohol Use: No  . Drug Use: No  . Sexual Activity: Not on file   Other Topics Concern  . Not on file   Social History Narrative    Medications:   Current Outpatient Prescriptions on File Prior to Visit  Medication Sig Dispense Refill  . amLODipine (NORVASC) 10 MG tablet Take 10 mg by mouth daily.  6  . aspirin EC 325 MG EC tablet Take 1 tablet (325 mg total) by mouth daily. 30 tablet 0  . atorvastatin (LIPITOR) 40 MG tablet Take 1 tablet (40 mg total) by mouth daily at 6 PM. 30 tablet 3  . carvedilol (COREG) 25 MG tablet Take 12.5 mg by mouth 2 (two) times daily with a meal.    . citalopram (CELEXA) 20 MG tablet Take 20 mg by mouth every morning.    . clopidogrel (PLAVIX)  75 MG tablet Take 75 mg by mouth daily.    . ergocalciferol (VITAMIN D2) 50000 UNITS capsule Take 50,000 Units by mouth once a week. Friday    . losartan-hydrochlorothiazide (HYZAAR) 100-25 MG per tablet Take 1 tablet by mouth every morning.    . Multiple Vitamins-Minerals (PRESERVISION AREDS PO) Take 1 capsule by mouth 2 (two) times daily.    . predniSONE (DELTASONE) 5 MG tablet Take 2.5 mg by mouth every other day.     . vitamin B-12 (CYANOCOBALAMIN) 1000 MCG tablet Take 1,000 mcg by mouth every morning.     No current facility-administered medications on file prior to visit.     Allergies:   Allergies  Allergen Reactions  . Lisinopril Other (See Comments)    cough    Physical Exam General: well developed, well nourished  Elderly Caucasian lady, seated, in no evident distress Head: head normocephalic and atraumatic.   Neck: supple with no carotid or supraclavicular bruits Cardiovascular: regular rate and rhythm, no murmurs Musculoskeletal: no deformity Skin:  no rash/petichiae Vascular:  Normal pulses all extremities  Neurologic Exam Mental Status: Awake and fully alert. Oriented to place and time. Recent and remote memory intact. Attention span, concentration and fund of knowledge appropriate. Mood and affect appropriate.  Cranial Nerves: Fundoscopic exam reveals sharp disc margins. Pupils equal, briskly reactive to light. Extraocular movements full without nystagmus. Visual fields full to confrontation. Hearing intact. Facial sensation intact. Face, tongue, palate moves normally and symmetrically.  Motor: Normal bulk and tone. Normal strength in all tested extremity muscles. Sensory.: intact to touch , pinprick , position and vibratory sensation.  Coordination: Rapid alternating movements normal in all extremities. Finger-to-nose and heel-to-shin performed accurately bilaterally. Gait and Station: Arises from chair without difficulty. Stance is normal. Gait demonstrates normal stride length and balance . Able to heel, toe and tandem walk without difficulty.  Reflexes: 1+ and symmetric. Toes downgoing.   NIHSS  0 Modified Rankin  0   ASSESSMENT: 67 year  Caucasian lady with small right internal capsule infarct in August 20 16 from small vessel disease with incidental finding of left cavernous carotid aneurysm. Vascular risk factors of hypertension and hyperlipidemia.    PLAN: I had a long d/w patient and daughter about her recent stroke and incidental cerebral aneurysms,, risk for recurrent stroke/TIAs, aneurysm ruopature, aneurysm treatment options  and complications of treatment, personally independently reviewed imaging studies and stroke evaluation results and answered questions.Continue aspirin 325 mg orally every day  for secondary stroke prevention and maintain strict control of hypertension with blood pressure goal below 130/90, diabetes with hemoglobin A1c goal below 6.5% and lipids with LDL cholesterol goal below 100 mg/dL. I also advised the patient to eat a healthy diet with plenty of whole grains, cereals, fruits and vegetables, exercise regularly and maintain ideal body weight. Follow-up with Dr. Estanislado Pandy for cerebral aneurysm treatment . Greater than 50% time during this 45 minute consultation was spent on counseling, review and development of plan of care and coordination of care.Followup in the future with me in 3 months or call earlier if necessary. Antony Contras, MD  Note: This document was prepared with digital dictation and possible smart phrase technology. Any transcriptional errors that result from this process are unintentional.

## 2015-03-27 NOTE — Patient Instructions (Signed)
I had a long d/w patient and daughter about her recent stroke and incidental cerebral aneurysms,, risk for recurrent stroke/TIAs, aneurysm ruopature, aneurysm treatment options and complications of treatment, personally independently reviewed imaging studies and stroke evaluation results and answered questions.Continue aspirin 325 mg orally every day  for secondary stroke prevention and maintain strict control of hypertension with blood pressure goal below 130/90, diabetes with hemoglobin A1c goal below 6.5% and lipids with LDL cholesterol goal below 100 mg/dL. I also advised the patient to eat a healthy diet with plenty of whole grains, cereals, fruits and vegetables, exercise regularly and maintain ideal body weight. Follow-up with Dr. Estanislado Pandy for cerebral aneurysm treatment Followup in the future with me in 3 months or call earlier if necessary. Stroke Prevention Some medical conditions and behaviors are associated with an increased chance of having a stroke. You may prevent a stroke by making healthy choices and managing medical conditions. HOW CAN I REDUCE MY RISK OF HAVING A STROKE?   Stay physically active. Get at least 30 minutes of activity on most or all days.  Do not smoke. It may also be helpful to avoid exposure to secondhand smoke.  Limit alcohol use. Moderate alcohol use is considered to be:  No more than 2 drinks per day for men.  No more than 1 drink per day for nonpregnant women.  Eat healthy foods. This involves:  Eating 5 or more servings of fruits and vegetables a day.  Making dietary changes that address high blood pressure (hypertension), high cholesterol, diabetes, or obesity.  Manage your cholesterol levels.  Making food choices that are high in fiber and low in saturated fat, trans fat, and cholesterol may control cholesterol levels.  Take any prescribed medicines to control cholesterol as directed by your health care provider.  Manage your  diabetes.  Controlling your carbohydrate and sugar intake is recommended to manage diabetes.  Take any prescribed medicines to control diabetes as directed by your health care provider.  Control your hypertension.  Making food choices that are low in salt (sodium), saturated fat, trans fat, and cholesterol is recommended to manage hypertension.  Ask your health care provider if you need treatment to lower your blood pressure. Take any prescribed medicines to control hypertension as directed by your health care provider.  If you are 19-53 years of age, have your blood pressure checked every 3-5 years. If you are 85 years of age or older, have your blood pressure checked every year.  Maintain a healthy weight.  Reducing calorie intake and making food choices that are low in sodium, saturated fat, trans fat, and cholesterol are recommended to manage weight.  Stop drug abuse.  Avoid taking birth control pills.  Talk to your health care provider about the risks of taking birth control pills if you are over 15 years old, smoke, get migraines, or have ever had a blood clot.  Get evaluated for sleep disorders (sleep apnea).  Talk to your health care provider about getting a sleep evaluation if you snore a lot or have excessive sleepiness.  Take medicines only as directed by your health care provider.  For some people, aspirin or blood thinners (anticoagulants) are helpful in reducing the risk of forming abnormal blood clots that can lead to stroke. If you have the irregular heart rhythm of atrial fibrillation, you should be on a blood thinner unless there is a good reason you cannot take them.  Understand all your medicine instructions.  Make sure that other conditions (  such as anemia or atherosclerosis) are addressed. SEEK IMMEDIATE MEDICAL CARE IF:   You have sudden weakness or numbness of the face, arm, or leg, especially on one side of the body.  Your face or eyelid droops to one  side.  You have sudden confusion.  You have trouble speaking (aphasia) or understanding.  You have sudden trouble seeing in one or both eyes.  You have sudden trouble walking.  You have dizziness.  You have a loss of balance or coordination.  You have a sudden, severe headache with no known cause.  You have new chest pain or an irregular heartbeat. Any of these symptoms may represent a serious problem that is an emergency. Do not wait to see if the symptoms will go away. Get medical help at once. Call your local emergency services (911 in [place] Do not drive yourself to the hospital.   This information is not intended to replace advice given to you by your health care provider. Make sure you discuss any questions you have with your health care provider.   Document Released: 07/09/2004 Document Revised: 06/22/2014 Document Reviewed: 12/02/2012 Elsevier Interactive Patient Education 2016 [employer] Cerebral Aneurysm An aneurysm is the bulging or ballooning out of part of the weakened wall of a vein or artery. An aneurysm in the vein or artery of the brain is called a brain aneurysm, or cerebral aneurysm.  Aneurysms are a risk to your health because they may leak or rupture. Once the aneurysm leaks or ruptures, bleeding occurs. If the bleeding occurs within the brain tissue, the condition is called an intracerebral hemorrhage. An intracerebral hemorrhage can result in a hemorrhagic stroke. If the bleeding occurs in the area between the brain and the thin tissues that cover the brain, the condition is called a subarachnoid hemorrhage. This increases the pressure on the brain and causes some areas of the brain to not get the necessary blood flow. The blood from the ruptured aneurysm collects and presses on the surrounding brain tissue. A subarachnoid hemorrhage can cause a stroke. A ruptured cerebral aneurysm is a medical emergency. This can cause permanent damage and loss of brain  function. CAUSES A cerebral aneurysm is caused when a weakened part of the blood vessel expands. The blood vessel expands due to the constant pressure from the flow of blood through the weakened blood vessel. Usually the aneurysm expands slowly. As the weakened aneurysm expands, the walls of the aneurysm become weaker. Aneurysms may be associated with diseases that weaken and damage the walls of your blood vessels or blood vessels that develop abnormally. Some known causes for cerebral aneurysms are:  Head trauma.  Infection.  Use of "recreational drugs" such as cocaine or amphetamines. RISK FACTORS People at risk for a cerebral aneurysm or hemorrhagic stroke usually have one or more risk factors, which include:  Having high blood pressure (hypertension).  Abusing alcohol.  Having abnormal blood vessels present since birth.  Having certain bleeding disorders, such as hemophilia, sickle cell disease, or liver disease.  Taking blood thinners (anticoagulants).  Smoking.  Having a family history of aneurysm. SIGNS AND SYMPTOMS  The signs and symptoms of an unruptured cerebral aneurysm will partly depend on its size and rate of growth. A small, unchanging aneurysm generally does not produce symptoms. A larger aneurysm that is steadily growing can increase pressure on the brain or nerves. That increased pressure from the unruptured cerebral aneurysm can cause:  A headache.  Problems with your vision.  Numbness or  weakness in an arm or leg.  Problems with memory.  Problems speaking.  Seizures. If an aneurysm leaks or bursts, it can cause a stroke and be life-threatening. Symptoms may include:  A sudden, severe headache with no known cause. The headache is often described as the worst headache ever experienced.  Nausea or vomiting, especially when combined with other symptoms such as a headache.  Sudden weakness or numbness of the face, arm, or leg, especially on one side of the  body.  Sudden trouble walking or difficulty moving arms or legs.  Sudden confusion.  Sudden personality changes.  Trouble speaking (aphasia) or understanding.  Difficulty swallowing.  Sudden trouble seeing in one or both eyes.  Double vision.  Dizziness.  Loss of balance or coordination.  Intolerance to light.  Stiff neck. DIAGNOSIS  Your health care provider may use one of the following tests to diagnose your aneurysm:  Computed tomographic angiography (CTA). This test uses dye and a scanner to produce images of your blood vessels.  Magnetic resonance angiography (MRA). This test uses an MRI machine to produce images of your blood vessels.  Digital subtraction angiography (DSA). This test uses dye and X-rays to take images of your blood vessels. Your health care provider may use this test to help determine the best course of treatment. TREATMENT  Unruptured Aneurysms Treatment is complex when an aneurysm is found and it is not causing problems. Treatment is very individualized, as each case is different. Many things must be considered, such as the size and exact location of your aneurysm, your age, your overall health, and your feelings and preferences. Small aneurysms in certain locations of the brain have a very low chance of bleeding or rupturing. These small aneurysms may not be treated. However, depending on the size and location of the aneurysm, treatments may be recommended and include:  Coiling. During this procedure, a catheter is inserted and advanced through a blood vessel. Once the catheter reaches the aneurysm, tiny coils are used to block blood flow into the aneurysm.  Surgical clipping. During surgery, a clip is placed at the base of the aneurysm. The clip prevents blood from continuing to enter the aneurysm.  Flow diversion. This procedure is used to divert blood flow around the aneurysm. Ruptured Aneurysms Immediate emergency surgery may be needed to help  prevent damage to the brain and to reduce the risk of rebleeding. Timing of treatment is an important factor in the prevention of complications. Successful early treatment of a ruptured aneurysm (within the first 3 days of a bleed) helps to prevent rebleeding and blood vessel spasm. In some cases, there may be a reason to treat later (10-14 days after a rupture). Many things are considered when making this decision, and each case is handled individually. HOME CARE INSTRUCTIONS  Take medicines only as instructed by your health care provider.  Eat healthy foods. It is recommended that you eat 5 or more servings of fruits and vegetables each day. Foods may need to be a special consistency (soft or pureed), or small bites may need to be taken if you have had a ruptured aneurysm or stroke. Certain dietary changes may be advised to address high blood pressure, high cholesterol, diabetes, or obesity.  Food choices that are low in salt (sodium), saturated fat, trans fat, and cholesterol are recommended to manage high blood pressure.  Food choices that are high in fiber and low in saturated fat, trans fat, and cholesterol are recommended to control cholesterol levels.  Controlling carbohydrate and sugar intake is recommended to manage diabetes.  Reducing calorie intake and making food choices that are low in sodium, saturated fat, trans fat, and cholesterol are recommended to manage obesity.  Maintain a healthy weight.  Stay physically active. It is recommended that you get at least 30 minutes of activity on most or all days.  Do not smoke.  Limit alcohol use. Moderate alcohol use is considered to be:  No more than 2 drinks each day for men.  No more than 1 drink each day for nonpregnant women.  Stop drug abuse.  A safe home environment is important to reduce the risk of falls. Your health care provider may arrange for specialists to evaluate your home. Having grab bars in the bedroom and  bathroom is often important. Your health care provider may arrange for special equipment to be used at home, such as raised toilets and a seat for the shower.  Physical, occupational, and speech therapy. Ongoing therapy may be needed to maximize your recovery after a ruptured aneurysm or stroke. If you have been advised to use a walker or a cane, use it at all times. Be sure to keep your therapy appointments.  Follow all instructions for follow-up with your health care provider. This is very important. This includes any referrals, physical therapy, rehabilitation, and laboratory tests. Proper follow-up may prevent an aneurysm rupture or a stroke. SEEK IMMEDIATE MEDICAL CARE IF:  You have a sudden, severe headache with no known cause.  You have sudden nausea or vomiting with a severe headache.  You have sudden weakness or numbness of the face, arm, or leg, especially on one side of the body.  You have sudden trouble walking or difficulty moving arms or legs.  You have sudden confusion.  You have trouble speaking or understanding.  You have sudden trouble seeing in one or both eyes.  You have a sudden loss of balance or coordination.  You have a stiff neck.  You have difficulty breathing.  You have a partial or total loss of consciousness. Any of these symptoms may represent a serious problem that is an emergency. Do not wait to see if the symptoms will go away. Get medical help at once. Call your local emergency services (911 in [place] Do not drive yourself to the hospital.   This information is not intended to replace advice given to you by your health care provider. Make sure you discuss any questions you have with your health care provider.   Document Released: 02/21/2002 Document Revised: 06/22/2014 Document Reviewed: 11/17/2012 Elsevier Interactive Patient Education [employer].

## 2015-03-28 ENCOUNTER — Other Ambulatory Visit: Payer: Self-pay | Admitting: Radiology

## 2015-03-28 LAB — PLATELET INHIBITION P2Y12: Platelet Function  P2Y12: 287 [PRU] (ref 194–418)

## 2015-03-29 ENCOUNTER — Telehealth (HOSPITAL_COMMUNITY): Payer: Self-pay | Admitting: *Deleted

## 2015-03-29 NOTE — Telephone Encounter (Deleted)
Called patients daughter.

## 2015-03-29 NOTE — Telephone Encounter (Signed)
Called daughter who is an Therapist, sports because patient phone not ringing.  Discussed lab results and medication changes per Dr. Estanislado Pandy.  Patient is to take Plavix 75 mg 1 tab in morning and 1/2 tab in the evening daily.  Continue ASA 325mg  daily.  Come back in on Friday to have repeat P2Y12.  Informed that we would need to reschedule procedure d/t lab values.  Daughter repeated instructions back and will make sure her mother is informed.

## 2015-04-01 ENCOUNTER — Ambulatory Visit (HOSPITAL_COMMUNITY): Admission: RE | Admit: 2015-04-01 | Payer: Medicare Other | Source: Ambulatory Visit

## 2015-04-01 ENCOUNTER — Ambulatory Visit (HOSPITAL_COMMUNITY)
Admission: RE | Admit: 2015-04-01 | Payer: Medicare Other | Source: Ambulatory Visit | Admitting: Interventional Radiology

## 2015-04-01 ENCOUNTER — Encounter (HOSPITAL_COMMUNITY): Admission: RE | Payer: Self-pay | Source: Ambulatory Visit

## 2015-04-01 SURGERY — RADIOLOGY WITH ANESTHESIA
Anesthesia: General

## 2015-04-05 ENCOUNTER — Other Ambulatory Visit: Payer: Self-pay | Admitting: Radiology

## 2015-04-05 LAB — PLATELET INHIBITION P2Y12: PLATELET FUNCTION P2Y12: 175 [PRU] — AB (ref 194–418)

## 2015-04-30 ENCOUNTER — Telehealth (HOSPITAL_COMMUNITY): Payer: Self-pay | Admitting: Interventional Radiology

## 2015-04-30 NOTE — Telephone Encounter (Signed)
Called pt and told her that we needed her to change her arrival time for her intervention with Deveshwar from 0630 to 0530. She agrees to do so. JM

## 2015-05-02 ENCOUNTER — Other Ambulatory Visit: Payer: Self-pay | Admitting: Radiology

## 2015-05-02 DIAGNOSIS — I729 Aneurysm of unspecified site: Secondary | ICD-10-CM

## 2015-05-02 LAB — PLATELET INHIBITION P2Y12: Platelet Function  P2Y12: 225 [PRU] (ref 194–418)

## 2015-05-06 ENCOUNTER — Telehealth (HOSPITAL_COMMUNITY): Payer: Self-pay | Admitting: *Deleted

## 2015-05-06 NOTE — Telephone Encounter (Signed)
Called and verified dose of medications for Plavix and ASA.  Pt confirmed she take 1 tablet 75mg  of Plavix in the am and Half a tablet 37.5mg  of Plavix in the PM as well as  325mg  of ECASA  Daily. States she needs refill of medication.  Will discuss lab results with Dr. Corena Pilgrim and call patient back.

## 2015-05-06 NOTE — Telephone Encounter (Signed)
Spoke with Dr. Estanislado Pandy regarding patients p2Y12 results, he requested patient take 1 tablet of Plavix 75mg  twice a day and continue EC ASA 325mg  daily.  Called and informed patient, she repeated instructions to me.  I also informed her that a refill for her plavix would be called in

## 2015-05-06 NOTE — Telephone Encounter (Signed)
Called [facility] per Dr. Estanislado Pandy instructions and refilled patients Plavix with new instructions to take Plavix 75mg  1 tablet twice a day, total of 60 tablets

## 2015-05-14 ENCOUNTER — Other Ambulatory Visit: Payer: Self-pay | Admitting: Physician Assistant

## 2015-05-16 ENCOUNTER — Encounter (HOSPITAL_COMMUNITY)
Admission: RE | Admit: 2015-05-16 | Discharge: 2015-05-16 | Disposition: A | Discharge status: Home / Self Care | Payer: Medicare Other | Source: Ambulatory Visit | Attending: Interventional Radiology | Admitting: Interventional Radiology

## 2015-05-16 ENCOUNTER — Encounter (HOSPITAL_COMMUNITY): Payer: Self-pay

## 2015-05-16 DIAGNOSIS — I671 Cerebral aneurysm, nonruptured: Secondary | ICD-10-CM | POA: Insufficient documentation

## 2015-05-16 DIAGNOSIS — Z01812 Encounter for preprocedural laboratory examination: Secondary | ICD-10-CM | POA: Diagnosis not present

## 2015-05-16 LAB — CBC WITH DIFFERENTIAL/PLATELET
Basophils Absolute: 0 10*3/uL (ref 0.0–0.1)
Basophils Relative: 0 %
EOS ABS: 0.3 10*3/uL (ref 0.0–0.7)
EOS PCT: 4 %
HCT: 33.4 % — ABNORMAL LOW (ref 36.0–46.0)
HEMOGLOBIN: 11.3 g/dL — AB (ref 12.0–15.0)
LYMPHS ABS: 1 10*3/uL (ref 0.7–4.0)
LYMPHS PCT: 15 %
MCH: 30.7 pg (ref 26.0–34.0)
MCHC: 33.8 g/dL (ref 30.0–36.0)
MCV: 90.8 fL (ref 78.0–100.0)
MONOS PCT: 8 %
Monocytes Absolute: 0.5 10*3/uL (ref 0.1–1.0)
Neutro Abs: 4.8 10*3/uL (ref 1.7–7.7)
Neutrophils Relative %: 73 %
PLATELETS: 243 10*3/uL (ref 150–400)
RBC: 3.68 MIL/uL — AB (ref 3.87–5.11)
RDW: 13.1 % (ref 11.5–15.5)
WBC: 6.6 10*3/uL (ref 4.0–10.5)

## 2015-05-16 LAB — BASIC METABOLIC PANEL
Anion gap: 8 (ref 5–15)
BUN: 22 mg/dL — AB (ref 6–20)
CHLORIDE: 107 mmol/L (ref 101–111)
CO2: 24 mmol/L (ref 22–32)
CREATININE: 1.27 mg/dL — AB (ref 0.44–1.00)
Calcium: 10.2 mg/dL (ref 8.9–10.3)
GFR calc Af Amer: 43 mL/min — ABNORMAL LOW (ref >60)
GFR calc non Af Amer: 37 mL/min — ABNORMAL LOW (ref >60)
GLUCOSE: 137 mg/dL — AB (ref 65–99)
POTASSIUM: 4 mmol/L (ref 3.5–5.1)
Sodium: 139 mmol/L (ref 135–145)

## 2015-05-16 LAB — PLATELET INHIBITION P2Y12: Platelet Function  P2Y12: 138 [PRU] — ABNORMAL LOW (ref 194–418)

## 2015-05-16 LAB — PROTIME-INR
INR: 1.02 (ref 0.00–1.49)
PROTHROMBIN TIME: 13.6 s (ref 11.6–15.2)

## 2015-05-16 LAB — APTT: aPTT: 30 seconds (ref 24–37)

## 2015-05-16 NOTE — Pre-Procedure Instructions (Signed)
    Tabitha Sanders  05/16/2015      [id number] - [place], [place] [id number] [place] [id number] Phone: [phone] Fax: [phone]    Your procedure is scheduled on 05/20/15.  Report to [facility] Admitting at 630 A.M.  Call this number if you have problems the morning of surgery:  [phone]   Remember:  Do not eat food or drink liquids after midnight.  Take these medicines the morning of surgery with A SIP OF WATER --norvasc,carvedilol,celexa,plavix,aspirin   Do not wear jewelry, make-up or nail polish.  Do not wear lotions, powders, or perfumes.  You may wear deodorant.  Do not shave 48 hours prior to surgery.  Men may shave face and neck.  Do not bring valuables to the hospital.  [facility] is not responsible for any belongings or valuables.  Contacts, dentures or bridgework may not be worn into surgery.  Leave your suitcase in the car.  After surgery it may be brought to your room.  For patients admitted to the hospital, discharge time will be determined by your treatment team.  Patients discharged the day of surgery will not be allowed to drive home.   Name and phone number of your driver:   Special instructions:   Please read over the following fact sheets that you were given. Pain Booklet, Coughing and Deep Breathing and Surgical Site Infection Prevention

## 2015-05-17 ENCOUNTER — Other Ambulatory Visit: Payer: Self-pay | Admitting: Radiology

## 2015-05-20 ENCOUNTER — Encounter (HOSPITAL_COMMUNITY): Payer: Self-pay | Admitting: Certified Registered Nurse Anesthetist

## 2015-05-20 ENCOUNTER — Ambulatory Visit (HOSPITAL_COMMUNITY): Payer: Medicare Other | Admitting: Anesthesiology

## 2015-05-20 ENCOUNTER — Inpatient Hospital Stay (HOSPITAL_COMMUNITY)
Admission: AD | Admit: 2015-05-20 | Discharge: 2015-05-21 | DRG: 027 | Disposition: A | Discharge status: Home / Self Care | Payer: Medicare Other | Source: Ambulatory Visit | Attending: Interventional Radiology | Admitting: Interventional Radiology

## 2015-05-20 ENCOUNTER — Ambulatory Visit (HOSPITAL_COMMUNITY)
Admission: RE | Admit: 2015-05-20 | Discharge: 2015-05-20 | Disposition: A | Discharge status: Home / Self Care | Payer: Medicare Other | Source: Ambulatory Visit | Attending: Interventional Radiology | Admitting: Interventional Radiology

## 2015-05-20 ENCOUNTER — Encounter (HOSPITAL_COMMUNITY)
Admission: AD | Disposition: A | Discharge status: Home / Self Care | Payer: Self-pay | Source: Ambulatory Visit | Attending: Interventional Radiology

## 2015-05-20 ENCOUNTER — Encounter (HOSPITAL_COMMUNITY): Payer: Self-pay

## 2015-05-20 DIAGNOSIS — Z90710 Acquired absence of both cervix and uterus: Secondary | ICD-10-CM | POA: Diagnosis not present

## 2015-05-20 DIAGNOSIS — Z9841 Cataract extraction status, right eye: Secondary | ICD-10-CM

## 2015-05-20 DIAGNOSIS — Z961 Presence of intraocular lens: Secondary | ICD-10-CM | POA: Diagnosis present

## 2015-05-20 DIAGNOSIS — I639 Cerebral infarction, unspecified: Secondary | ICD-10-CM

## 2015-05-20 DIAGNOSIS — I10 Essential (primary) hypertension: Secondary | ICD-10-CM | POA: Diagnosis present

## 2015-05-20 DIAGNOSIS — I671 Cerebral aneurysm, nonruptured: Principal | ICD-10-CM | POA: Diagnosis present

## 2015-05-20 DIAGNOSIS — Z7952 Long term (current) use of systemic steroids: Secondary | ICD-10-CM | POA: Diagnosis not present

## 2015-05-20 DIAGNOSIS — Z7982 Long term (current) use of aspirin: Secondary | ICD-10-CM

## 2015-05-20 DIAGNOSIS — Z8673 Personal history of transient ischemic attack (TIA), and cerebral infarction without residual deficits: Secondary | ICD-10-CM

## 2015-05-20 DIAGNOSIS — Z9842 Cataract extraction status, left eye: Secondary | ICD-10-CM

## 2015-05-20 DIAGNOSIS — I771 Stricture of artery: Secondary | ICD-10-CM

## 2015-05-20 DIAGNOSIS — Z79899 Other long term (current) drug therapy: Secondary | ICD-10-CM

## 2015-05-20 DIAGNOSIS — Z7902 Long term (current) use of antithrombotics/antiplatelets: Secondary | ICD-10-CM

## 2015-05-20 HISTORY — PX: RADIOLOGY WITH ANESTHESIA: SHX6223

## 2015-05-20 HISTORY — DX: Cerebral aneurysm, nonruptured: I67.1

## 2015-05-20 LAB — POCT ACTIVATED CLOTTING TIME
ACTIVATED CLOTTING TIME: 173 s
ACTIVATED CLOTTING TIME: 183 s
Activated Clotting Time: 183 seconds

## 2015-05-20 LAB — MRSA PCR SCREENING: MRSA BY PCR: NEGATIVE

## 2015-05-20 LAB — HEPARIN LEVEL (UNFRACTIONATED): HEPARIN UNFRACTIONATED: 0.17 [IU]/mL — AB (ref 0.30–0.70)

## 2015-05-20 SURGERY — RADIOLOGY WITH ANESTHESIA
Anesthesia: General

## 2015-05-20 MED ORDER — NICARDIPINE HCL IN NACL 20-0.86 MG/200ML-% IV SOLN
5.0000 mg/h | INTRAVENOUS | Status: DC
  Administered 2015-05-20 (×3): 7.45 mg/h via INTRAVENOUS
  Administered 2015-05-20 – 2015-05-21 (×2): 5 mg/h via INTRAVENOUS
  Filled 2015-05-20 (×5): qty 200

## 2015-05-20 MED ORDER — CEFAZOLIN SODIUM-DEXTROSE 2-3 GM-% IV SOLR: 2.0000 g | Freq: Once | INTRAVENOUS | Status: DC

## 2015-05-20 MED ORDER — EPHEDRINE SULFATE 50 MG/ML IJ SOLN
INTRAMUSCULAR | Status: DC | PRN
  Administered 2015-05-20: 5 mg via INTRAVENOUS
  Administered 2015-05-20: 2.5 mg via INTRAVENOUS

## 2015-05-20 MED ORDER — AMLODIPINE BESYLATE 10 MG PO TABS
10.0000 mg | ORAL_TABLET | Freq: Every day | ORAL | Status: DC
  Administered 2015-05-21: 10 mg via ORAL
  Filled 2015-05-20: qty 1

## 2015-05-20 MED ORDER — FENTANYL CITRATE (PF) 100 MCG/2ML IJ SOLN: 25.0000 ug | INTRAMUSCULAR | Status: DC | PRN

## 2015-05-20 MED ORDER — PHENYLEPHRINE HCL 10 MG/ML IJ SOLN
10.0000 mg | INTRAVENOUS | Status: DC | PRN
  Administered 2015-05-20: 15 ug/min via INTRAVENOUS

## 2015-05-20 MED ORDER — CLOPIDOGREL BISULFATE 75 MG PO TABS
75.0000 mg | ORAL_TABLET | ORAL | Status: DC
  Filled 2015-05-20: qty 1

## 2015-05-20 MED ORDER — TETRACAINE HCL 1 % IJ SOLN
20.0000 mg | Freq: Once | INTRAMUSCULAR | Status: DC
  Filled 2015-05-20: qty 2

## 2015-05-20 MED ORDER — GLYCOPYRROLATE 0.2 MG/ML IJ SOLN
INTRAMUSCULAR | Status: DC | PRN
Start: 2015-05-20 — End: 2015-05-20
  Administered 2015-05-20: 0.2 mg via INTRAVENOUS
  Administered 2015-05-20: 0.4 mg via INTRAVENOUS
  Administered 2015-05-20: 0.2 mg via INTRAVENOUS

## 2015-05-20 MED ORDER — SODIUM CHLORIDE 0.9 % IV SOLN: INTRAVENOUS | Status: DC

## 2015-05-20 MED ORDER — HYDROCHLOROTHIAZIDE 25 MG PO TABS
25.0000 mg | ORAL_TABLET | Freq: Every day | ORAL | Status: DC
  Administered 2015-05-20 – 2015-05-21 (×2): 25 mg via ORAL
  Filled 2015-05-20 (×2): qty 1

## 2015-05-20 MED ORDER — ACETAMINOPHEN 500 MG PO TABS: 1000.0000 mg | ORAL_TABLET | Freq: Four times a day (QID) | ORAL | Status: DC | PRN

## 2015-05-20 MED ORDER — HEPARIN (PORCINE) IN NACL 100-0.45 UNIT/ML-% IJ SOLN
550.0000 [IU]/h | INTRAMUSCULAR | Status: DC
  Filled 2015-05-20: qty 250

## 2015-05-20 MED ORDER — HYDRALAZINE HCL 20 MG/ML IJ SOLN
INTRAMUSCULAR | Status: DC | PRN
  Administered 2015-05-20 (×2): 10 mg via INTRAVENOUS

## 2015-05-20 MED ORDER — DILTIAZEM HCL 100 MG IV SOLR
5.0000 mg/h | INTRAVENOUS | Status: DC
  Filled 2015-05-20: qty 100

## 2015-05-20 MED ORDER — CITALOPRAM HYDROBROMIDE 20 MG PO TABS
20.0000 mg | ORAL_TABLET | Freq: Every morning | ORAL | Status: DC
  Administered 2015-05-21: 20 mg via ORAL
  Filled 2015-05-20: qty 1

## 2015-05-20 MED ORDER — HEPARIN SODIUM (PORCINE) 1000 UNIT/ML IJ SOLN
INTRAMUSCULAR | Status: DC | PRN
  Administered 2015-05-20 (×2): 500 [IU] via INTRAVENOUS
  Administered 2015-05-20: 2000 [IU] via INTRAVENOUS
  Administered 2015-05-20: 1000 [IU] via INTRAVENOUS

## 2015-05-20 MED ORDER — SODIUM CHLORIDE 0.9 % IV SOLN
INTRAVENOUS | Status: DC
  Administered 2015-05-20: 75 mL via INTRAVENOUS

## 2015-05-20 MED ORDER — ASPIRIN EC 325 MG PO TBEC: 325.0000 mg | DELAYED_RELEASE_TABLET | Freq: Every day | ORAL | Status: DC

## 2015-05-20 MED ORDER — ROCURONIUM BROMIDE 100 MG/10ML IV SOLN
INTRAVENOUS | Status: DC | PRN
  Administered 2015-05-20: 50 mg via INTRAVENOUS

## 2015-05-20 MED ORDER — SUGAMMADEX SODIUM 200 MG/2ML IV SOLN
INTRAVENOUS | Status: DC | PRN
  Administered 2015-05-20: 200 mg via INTRAVENOUS

## 2015-05-20 MED ORDER — ACETAMINOPHEN 650 MG RE SUPP: 650.0000 mg | Freq: Four times a day (QID) | RECTAL | Status: DC | PRN

## 2015-05-20 MED ORDER — ONDANSETRON HCL 4 MG/2ML IJ SOLN: 4.0000 mg | Freq: Four times a day (QID) | INTRAMUSCULAR | Status: DC | PRN

## 2015-05-20 MED ORDER — HEPARIN (PORCINE) IN NACL 100-0.45 UNIT/ML-% IJ SOLN
INTRAMUSCULAR | Status: AC
  Filled 2015-05-20: qty 250

## 2015-05-20 MED ORDER — IOHEXOL 300 MG/ML  SOLN: 150.0000 mL | Freq: Once | INTRAMUSCULAR | Status: DC | PRN

## 2015-05-20 MED ORDER — FENTANYL CITRATE (PF) 100 MCG/2ML IJ SOLN
INTRAMUSCULAR | Status: DC | PRN
  Administered 2015-05-20 (×2): 25 ug via INTRAVENOUS

## 2015-05-20 MED ORDER — ONDANSETRON HCL 4 MG/2ML IJ SOLN
INTRAMUSCULAR | Status: DC | PRN
  Administered 2015-05-20: 4 mg via INTRAVENOUS

## 2015-05-20 MED ORDER — CLOPIDOGREL BISULFATE 75 MG PO TABS
75.0000 mg | ORAL_TABLET | Freq: Every day | ORAL | Status: DC
  Administered 2015-05-21: 75 mg via ORAL
  Filled 2015-05-20: qty 1

## 2015-05-20 MED ORDER — IOHEXOL 300 MG/ML  SOLN
150.0000 mL | Freq: Once | INTRAMUSCULAR | Status: AC | PRN
  Administered 2015-05-20: 125 mL via INTRAVENOUS

## 2015-05-20 MED ORDER — PROMETHAZINE HCL 25 MG/ML IJ SOLN: 6.2500 mg | INTRAMUSCULAR | Status: DC | PRN

## 2015-05-20 MED ORDER — CEFAZOLIN SODIUM-DEXTROSE 2-3 GM-% IV SOLR
2.0000 g | Freq: Once | INTRAVENOUS | Status: AC
  Administered 2015-05-20: 2 g via INTRAVENOUS
  Filled 2015-05-20 (×2): qty 50

## 2015-05-20 MED ORDER — PROPOFOL 10 MG/ML IV BOLUS
INTRAVENOUS | Status: DC | PRN
  Administered 2015-05-20: 100 mg via INTRAVENOUS

## 2015-05-20 MED ORDER — ASPIRIN EC 325 MG PO TBEC
325.0000 mg | DELAYED_RELEASE_TABLET | ORAL | Status: DC
  Filled 2015-05-20: qty 1

## 2015-05-20 MED ORDER — LIDOCAINE HCL (CARDIAC) 20 MG/ML IV SOLN
INTRAVENOUS | Status: DC | PRN
  Administered 2015-05-20: 40 mg via INTRAVENOUS

## 2015-05-20 MED ORDER — CLOPIDOGREL BISULFATE 75 MG PO TABS: 75.0000 mg | ORAL_TABLET | Freq: Two times a day (BID) | ORAL | Status: DC

## 2015-05-20 MED ORDER — LOSARTAN POTASSIUM 50 MG PO TABS
100.0000 mg | ORAL_TABLET | Freq: Every day | ORAL | Status: DC
  Administered 2015-05-20 – 2015-05-21 (×2): 100 mg via ORAL
  Filled 2015-05-20 (×2): qty 2

## 2015-05-20 MED ORDER — POLYVINYL ALCOHOL 1.4 % OP SOLN
1.0000 [drp] | Freq: Three times a day (TID) | OPHTHALMIC | Status: DC | PRN
  Filled 2015-05-20: qty 15

## 2015-05-20 MED ORDER — ACETAMINOPHEN 500 MG PO TABS: 500.0000 mg | ORAL_TABLET | Freq: Four times a day (QID) | ORAL | Status: DC | PRN

## 2015-05-20 MED ORDER — SODIUM CHLORIDE 0.9 % IJ SOLN
25.0000 ug | INTRAVENOUS | Status: AC | PRN
  Administered 2015-05-20: 25 ug via INTRA_ARTERIAL

## 2015-05-20 MED ORDER — NIMODIPINE 30 MG PO CAPS
0.0000 mg | ORAL_CAPSULE | ORAL | Status: AC
  Administered 2015-05-20: 60 mg via ORAL
  Filled 2015-05-20 (×2): qty 2

## 2015-05-20 MED ORDER — LACTATED RINGERS IV SOLN
INTRAVENOUS | Status: DC | PRN
  Administered 2015-05-20: 08:00:00 via INTRAVENOUS

## 2015-05-20 MED ORDER — TETRACAINE HCL 1 % IJ SOLN
80.0000 mg | Freq: Once | INTRAMUSCULAR | Status: DC
  Filled 2015-05-20: qty 8

## 2015-05-20 MED ORDER — LOSARTAN POTASSIUM-HCTZ 100-25 MG PO TABS: 1.0000 | ORAL_TABLET | Freq: Every day | ORAL | Status: DC

## 2015-05-20 MED ORDER — ASPIRIN 325 MG PO TABS
325.0000 mg | ORAL_TABLET | Freq: Every day | ORAL | Status: DC
  Administered 2015-05-21: 325 mg via ORAL
  Filled 2015-05-20: qty 1

## 2015-05-20 MED ORDER — NITROGLYCERIN 1 MG/10 ML FOR IR/CATH LAB
INTRA_ARTERIAL | Status: AC
  Filled 2015-05-20: qty 10

## 2015-05-20 NOTE — Anesthesia Procedure Notes (Signed)
Procedure Name: Intubation Date/Time: 05/20/2015 10:12 AM Performed by: Clearnce Sorrel Pre-anesthesia Checklist: Patient identified, Emergency Drugs available, Suction available, Patient being monitored and Timeout performed Patient Re-evaluated:Patient Re-evaluated prior to inductionOxygen Delivery Method: Circle system utilized Preoxygenation: Pre-oxygenation with 100% oxygen Intubation Type: IV induction Ventilation: Mask ventilation without difficulty and Oral airway inserted - appropriate to patient size Laryngoscope Size: Mac and 4 Grade View: Grade I Tube type: Oral Tube size: 7.0 mm Number of attempts: 1 Airway Equipment and Method: Stylet Placement Confirmation: positive ETCO2,  ETT inserted through vocal cords under direct vision and breath sounds checked- equal and bilateral Secured at: 22 cm Tube secured with: Tape Dental Injury: Teeth and Oropharynx as per pre-operative assessment

## 2015-05-20 NOTE — Anesthesia Postprocedure Evaluation (Signed)
Anesthesia Post Note  Patient: Tabitha Sanders  Procedure(s) Performed: Procedure(s) (LRB): RADIOLOGY WITH ANESTHESIA (N/A)  Patient location during evaluation: PACU Anesthesia Type: General Level of consciousness: sedated Pain management: pain level controlled Vital Signs Assessment: post-procedure vital signs reviewed and stable Respiratory status: spontaneous breathing, nonlabored ventilation and respiratory function stable Cardiovascular status: stable Postop Assessment: no signs of nausea or vomiting Anesthetic complications: no    Last Vitals:  Filed Vitals:   05/20/15 0652  BP: 151/51  Pulse: 51  Temp: 36.4 C  Resp: 18    Last Pain: There were no vitals filed for this visit.               Latana Colin S

## 2015-05-20 NOTE — Progress Notes (Signed)
Called Dr. Estanislado Pandy about pt's systolic blood pressure being >130 (sbp goal 110-130).  Pt on cardene @75ml /hr and Heparin gtt @5ml /hr.  MD wants patient's total fluid volume to be 80cc/hr.  Dr. Estanislado Pandy OK with SBP in 130's range as long as bp does not consistently continue to climb higher.  Will continue to monitor pt.

## 2015-05-20 NOTE — H&P (Signed)
Chief Complaint: Patient was seen in consultation today for embolization of Left internal cartotid artery aneurysm at the request of Dr Leonie Man  Referring Physician(s): Dr Leonie Man  History of Present Illness: Tabitha Sanders is a 81 y.o. female   Suffered CVA 01/2015 Was hospitalized and seen with Dr Leonie Man Work up included MRI/MRA 01/25/2015:  IMPRESSION: 1. No proximal or large arterial branch occlusion identified within the intracranial circulation. 2. No hemodynamically significant or correctable stenosis identified. In fact, there is relatively little atheromatous disease within the intracranial circulation for patient age. 3. 9 x 6 x 8 mm wide necked aneurysm arising from the cavernous left ICA. This is directed inferiorly and slightly medially  Referred top Dr Estanislado Pandy for evaluation and possible treatment of aneurysm Consultation 02/13/2015 + family history of intracranial aneurysms Decision made with pt and daughter to move ahead with Neuro Interventional embolization with Dr Estanislado Pandy   Past Medical History  Diagnosis Date  . Hypertension   . Depression   . Anemia     iron deficiency anemia and pernicious  . Stroke [facility]) January 25, 2015    No deficits  . HOH (hard of hearing)     wears bilateral hearing aids  . Polyarthritis rheumatica (HCC)     Past Surgical History  Procedure Laterality Date  . Abdominal hysterectomy      complete  . Colonoscopy with propofol N/A 08/20/2014    Procedure: COLONOSCOPY WITH PROPOFOL;  Surgeon: Howell Rucks, MD;  Location: [facility];  Service: Endoscopy;  Laterality: N/A;  . Eye surgery      bilateral cataract with lens implant  . Breast surgery      biopsy- bilateral- benign    Allergies: Lidocaine-epinephrine and Lisinopril  Medications: Prior to Admission medications   Medication Sig Start Date End Date Taking? Authorizing Provider  acetaminophen (TYLENOL) 500 MG tablet Take 500 mg by mouth every 6 (six) hours  as needed for mild pain.   Yes Historical Provider, MD  amLODipine (NORVASC) 10 MG tablet Take 10 mg by mouth daily. 11/21/14  Yes Historical Provider, MD  aspirin EC 325 MG EC tablet Take 1 tablet (325 mg total) by mouth daily. 01/27/15  Yes Alyssa A Lincoln Brigham, MD  atorvastatin (LIPITOR) 40 MG tablet Take 1 tablet (40 mg total) by mouth daily at 6 PM. Patient taking differently: Take 40 mg by mouth every evening.  01/27/15  Yes Alyssa A Lincoln Brigham, MD  carvedilol (COREG) 25 MG tablet Take 12.5 mg by mouth 2 (two) times daily with a meal.   Yes Historical Provider, MD  citalopram (CELEXA) 20 MG tablet Take 20 mg by mouth every morning.   Yes Historical Provider, MD  clopidogrel (PLAVIX) 75 MG tablet Take 75 mg by mouth 2 (two) times daily.    Yes Historical Provider, MD  ergocalciferol (VITAMIN D2) 50000 UNITS capsule Take 50,000 Units by mouth once a week. Friday   Yes Historical Provider, MD  hydroxypropyl methylcellulose / hypromellose (ISOPTO TEARS / GONIOVISC) 2.5 % ophthalmic solution Place 1 drop into both eyes 3 (three) times daily as needed for dry eyes.   Yes Historical Provider, MD  losartan-hydrochlorothiazide (HYZAAR) 100-25 MG per tablet Take 1 tablet by mouth daily.    Yes Historical Provider, MD  Multiple Vitamins-Minerals (PRESERVISION AREDS PO) Take 1 capsule by mouth 2 (two) times daily.   Yes Historical Provider, MD  vitamin B-12 (CYANOCOBALAMIN) 1000 MCG tablet Take 1,000 mcg by mouth every morning.   Yes Historical Provider,  MD  predniSONE (DELTASONE) 5 MG tablet Take 2.5 mg by mouth every other day.     Historical Provider, MD     Family History  Problem Relation Age of Onset  . Stroke Mother   . Stroke Sister     Social History   Social History  . Marital Status: Married    Spouse Name: N/A  . Number of Children: N/A  . Years of Education: N/A   Social History Main Topics  . Smoking status: Never Smoker   . Smokeless tobacco: None  . Alcohol Use: No  . Drug Use: No  .  Sexual Activity: Not Asked   Other Topics Concern  . None   Social History Narrative    Review of Systems: A 12 point ROS discussed and pertinent positives are indicated in the HPI above.  All other systems are negative.  Review of Systems  Constitutional: Positive for activity change. Negative for fever, diaphoresis and fatigue.  HENT: Negative for tinnitus.   Eyes: Negative for visual disturbance.  Respiratory: Negative for cough and shortness of breath.   Neurological: Positive for dizziness and light-headedness. Negative for tremors, seizures, syncope, facial asymmetry, speech difficulty, weakness, numbness and headaches.    Vital Signs: There were no vitals taken for this visit.  Physical Exam  Constitutional: She is oriented to person, place, and time. She appears well-nourished.  HENT:  Head: Atraumatic.  Eyes: EOM are normal.  Neck: Normal range of motion.  Cardiovascular: Normal rate, regular rhythm and normal heart sounds.   No murmur heard. Pulmonary/Chest: Effort normal and breath sounds normal. She has no wheezes.  Abdominal: Soft. Bowel sounds are normal. There is no tenderness.  Musculoskeletal: Normal range of motion.  Neurological: She is alert and oriented to person, place, and time.  Skin: Skin is warm and dry.  Psychiatric: She has a normal mood and affect. Her behavior is normal. Judgment and thought content normal.  Nursing note and vitals reviewed.   Mallampati Score:  MD Evaluation Airway: WNL Heart: WNL Abdomen: WNL Chest/ Lungs: WNL ASA  Classification: 3 Mallampati/Airway Score: One  Imaging: No results found.  Labs:  CBC:  Recent Labs  01/25/15 1342 01/25/15 1346 03/25/15 1451 05/16/15 1327  WBC 9.7  --  7.1 6.6  HGB 12.5 12.2 11.8* 11.3*  HCT 35.5* 36.0 34.5* 33.4*  PLT 196  --  240 243    COAGS:  Recent Labs  01/25/15 1342 03/25/15 1451 05/16/15 1327  INR 0.99 1.02 1.02  APTT 29 28 30     BMP:  Recent Labs   01/25/15 1342 01/25/15 1346 03/25/15 1451 05/16/15 1327  NA 141 140 139 139  K 4.2 4.0 4.1 4.0  CL 108 105 107 107  CO2 24  --  24 24  GLUCOSE 114* 110* 98 137*  BUN 16 18 18  22*  CALCIUM 10.4*  --  10.1 10.2  CREATININE 1.11* 1.00 1.24* 1.27*  GFRNONAA 43*  --  38* 37*  GFRAA 50*  --  44* 43*    LIVER FUNCTION TESTS:  Recent Labs  01/25/15 1342  BILITOT 0.9  AST 18  ALT 13*  ALKPHOS 68  PROT 6.7  ALBUMIN 3.9    TUMOR MARKERS: No results for input(s): AFPTM, CEA, CA199, CHROMGRNA in the last 8760 hours.  Assessment and Plan:  CVA 01/2015 Work up revealed L ICA aneurysms Consultation with Dr Estanislado Pandy for possible treatment Cerebral arteriogram with possible L internal carotic artery aneurysm(s) embolization Now scheduled  for same Risks and Benefits discussed with the patient including, but not limited to bleeding, infection, vascular injury, contrast induced renal failure, stroke or even death. All of the patient's questions were answered, patient is agreeable to proceed. Consent signed and in chart. Pt aware she will be admitted into Neuro ICU if intervention  Is performed---plan for discharge following day. Agreeable to proceed  Thank you for this interesting consult.  I greatly enjoyed meeting GUYNELL ELDEN and look forward to participating in their care.  A copy of this report was sent to the requesting provider on this date.  Signed: Sherilee Smotherman A 05/20/2015, 7:31 AM   I spent a total of  30 Minutes   in face to face in clinical consultation, greater than 50% of which was counseling/coordinating care for cerebral arteriogram with possible L ICA aneurysm embolization

## 2015-05-20 NOTE — Anesthesia Preprocedure Evaluation (Signed)
Anesthesia Evaluation  Patient identified by MRN, date of birth, ID band Patient awake    Reviewed: Allergy & Precautions, NPO status , Patient's Chart, lab work & pertinent test results  Airway Mallampati: II  TM Distance: >3 FB Neck ROM: Full    Dental no notable dental hx.    Pulmonary neg pulmonary ROS,    Pulmonary exam normal breath sounds clear to auscultation       Cardiovascular hypertension, Pt. on medications Normal cardiovascular exam Rhythm:Regular Rate:Normal     Neuro/Psych CVA negative psych ROS   GI/Hepatic negative GI ROS, Neg liver ROS,   Endo/Other  negative endocrine ROS  Renal/GU negative Renal ROS  negative genitourinary   Musculoskeletal negative musculoskeletal ROS (+)   Abdominal   Peds negative pediatric ROS (+)  Hematology negative hematology ROS (+)   Anesthesia Other Findings   Reproductive/Obstetrics negative OB ROS                             Anesthesia Physical Anesthesia Plan  ASA: III  Anesthesia Plan: General   Post-op Pain Management:    Induction: Intravenous  Airway Management Planned: Oral ETT  Additional Equipment: Arterial line  Intra-op Plan:   Post-operative Plan: Extubation in OR  Informed Consent: I have reviewed the patients History and Physical, chart, labs and discussed the procedure including the risks, benefits and alternatives for the proposed anesthesia with the patient or authorized representative who has indicated his/her understanding and acceptance.   Dental advisory given  Plan Discussed with: CRNA and Surgeon  Anesthesia Plan Comments:         Anesthesia Quick Evaluation

## 2015-05-20 NOTE — Procedures (Signed)
S/P Lt common carotid arteriogram followed by placement of a 34mmx 20 mm pipeline Flex device across wide neck complex lobulated aneurysm  Of the lt ICA at prox cavernous carotid level

## 2015-05-20 NOTE — Transfer of Care (Signed)
Immediate Anesthesia Transfer of Care Note  Patient: Tabitha Sanders  Procedure(s) Performed: Procedure(s): RADIOLOGY WITH ANESTHESIA (N/A)  Patient Location: PACU  Anesthesia Type:General  Level of Consciousness: awake, alert  and oriented  Airway & Oxygen Therapy: Patient Spontanous Breathing and Patient connected to face mask oxygen  Post-op Assessment: Report given to RN and Post -op Vital signs reviewed and stable  Post vital signs: Reviewed and stable  Last Vitals:  Filed Vitals:   05/20/15 0652  BP: 151/51  Pulse: 51  Temp: 36.4 C  Resp: 18    Complications: No apparent anesthesia complications

## 2015-05-20 NOTE — Progress Notes (Signed)
ANTICOAGULATION CONSULT NOTE - Follow Up Consult  Pharmacy Consult for heparin Indication: post-embolization of L-internal carotid artery aneurysm  Allergies  Allergen Reactions  . Lidocaine-Epinephrine Other (See Comments)    Tachycardia; reports that she can tolerate plain lidocaine  . Lisinopril Other (See Comments)    cough    Patient Measurements: Height: 5\' 3"  (160 cm) Weight: 155 lb 6.8 oz (70.5 kg) IBW/kg (Calculated) : 52.4 Heparin Dosing Weight: ~67 kg  Vital Signs: Temp: 98.9 F (37.2 C) (12/05 2000) Temp Source: Oral (12/05 2000) BP: 116/45 mmHg (12/05 2100) Pulse Rate: 73 (12/05 2100)  Labs:  Recent Labs  05/20/15 2110  HEPARINUNFRC 0.17*    Estimated Creatinine Clearance: 29.4 mL/min (by C-G formula based on Cr of 1.27).   Medications:  Infusions:  . sodium chloride 75 mL/hr at 05/20/15 2100  . diltiazem (CARDIZEM) infusion    . heparin 550 Units/hr (05/20/15 2100)  . niCARDipine 7.45 mg/hr (05/20/15 2100)    Assessment: 81 year old female s/p embolization of carotid artery aneurysm on IV heparin post-operatively.  Initial heparin level is 0.17 - therapeutic for low goal.  No overt bleeding documented.  Heparin therapy to end 12/6 at 1229PM.   Goal of Therapy:  Heparin level 0.1 to 0.25 units/ml aPTT 50-60 seconds Monitor platelets by anticoagulation protocol: Yes   Plan:  Continue heparin at 550 units/hr.  Follow-up heparin level in AM.  Monitor for signs and symptoms of bleeding.   Sloan Leiter, PharmD, BCPS Clinical Pharmacist [phone] 05/20/2015,10:01 PM

## 2015-05-20 NOTE — Progress Notes (Addendum)
ANTICOAGULATION CONSULT NOTE - Initial Consult  Pharmacy Consult for heparin Indication: embolization of L internal carotid artery aneurysm  Allergies  Allergen Reactions  . Lidocaine-Epinephrine Other (See Comments)    Tachycardia; reports that she can tolerate plain lidocaine  . Lisinopril Other (See Comments)    cough    Patient Measurements: Weight: 159 lb (72.122 kg)  IBW: 50.1 kg Heparin Dosing Weight: 65.4 kg  Vital Signs: Temp: 97.5 F (36.4 C) (12/05 0652) Temp Source: Oral (12/05 0652) BP: 151/51 mmHg (12/05 0652) Pulse Rate: 51 (12/05 0652)  Labs: No results for input(s): HGB, HCT, PLT, APTT, LABPROT, INR, HEPARINUNFRC, CREATININE, CKTOTAL, CKMB, TROPONINI in the last 72 hours.  Estimated Creatinine Clearance: 29 mL/min (by C-G formula based on Cr of 1.27).   Medical History: Past Medical History  Diagnosis Date  . Hypertension   . Depression   . Anemia     iron deficiency anemia and pernicious  . Stroke [facility]) January 25, 2015    No deficits  . HOH (hard of hearing)     wears bilateral hearing aids  . Polyarthritis rheumatica (HCC)     Medications:  Prescriptions prior to admission  Medication Sig Dispense Refill Last Dose  . acetaminophen (TYLENOL) 500 MG tablet Take 500 mg by mouth every 6 (six) hours as needed for mild pain.   Past Week at Unknown time  . amLODipine (NORVASC) 10 MG tablet Take 10 mg by mouth daily.  6 05/20/2015 at Unknown time  . aspirin EC 325 MG EC tablet Take 1 tablet (325 mg total) by mouth daily. 30 tablet 0 05/20/2015 at Unknown time  . atorvastatin (LIPITOR) 40 MG tablet Take 1 tablet (40 mg total) by mouth daily at 6 PM. (Patient taking differently: Take 40 mg by mouth every evening. ) 30 tablet 3 05/19/2015 at Unknown time  . carvedilol (COREG) 25 MG tablet Take 12.5 mg by mouth 2 (two) times daily with a meal.   05/20/2015 at 0500  . citalopram (CELEXA) 20 MG tablet Take 20 mg by mouth every morning.   05/20/2015 at Unknown time   . clopidogrel (PLAVIX) 75 MG tablet Take 75 mg by mouth 2 (two) times daily.    05/20/2015 at Unknown time  . ergocalciferol (VITAMIN D2) 50000 UNITS capsule Take 50,000 Units by mouth once a week. Friday   05/19/2015 at Unknown time  . hydroxypropyl methylcellulose / hypromellose (ISOPTO TEARS / GONIOVISC) 2.5 % ophthalmic solution Place 1 drop into both eyes 3 (three) times daily as needed for dry eyes.   Past Week at Unknown time  . losartan-hydrochlorothiazide (HYZAAR) 100-25 MG per tablet Take 1 tablet by mouth daily.    05/19/2015 at Unknown time  . Multiple Vitamins-Minerals (PRESERVISION AREDS PO) Take 1 capsule by mouth 2 (two) times daily.   05/19/2015 at Unknown time  . vitamin B-12 (CYANOCOBALAMIN) 1000 MCG tablet Take 1,000 mcg by mouth every morning.   05/19/2015 at Unknown time  . predniSONE (DELTASONE) 5 MG tablet Take 2.5 mg by mouth every other day.    Taking    Assessment: 81 yo female with hx CVA in August 2016, now undergoing embolization for L internal carotid artery aneurysm. CBC stable.  Goal of Therapy:  APTT 50-60 seconds,  HL goal 0.1-0.25  Monitor platelets by anticoagulation protocol: Yes   Plan:  -Heparin at 550 units/hr -Check 8 hour level -Monitor s/sx bleeding   Harvel Quale 05/20/2015,12:35 PM

## 2015-05-20 NOTE — Progress Notes (Signed)
Patient ID: Tabitha Sanders, female   DOB: 1926-05-10, 81 y.o.   MRN: [id number]         Subjective:  Pt doing well; no acute c/o  Allergies: Lidocaine-epinephrine and Lisinopril  Medications: Prior to Admission medications   Medication Sig Start Date End Date Taking? Authorizing Provider  acetaminophen (TYLENOL) 500 MG tablet Take 500 mg by mouth every 6 (six) hours as needed for mild pain.   Yes Historical Provider, MD  amLODipine (NORVASC) 10 MG tablet Take 10 mg by mouth daily. 11/21/14  Yes Historical Provider, MD  aspirin EC 325 MG EC tablet Take 1 tablet (325 mg total) by mouth daily. 01/27/15  Yes Alyssa A Lincoln Brigham, MD  atorvastatin (LIPITOR) 40 MG tablet Take 1 tablet (40 mg total) by mouth daily at 6 PM. Patient taking differently: Take 40 mg by mouth every evening.  01/27/15  Yes Alyssa A Lincoln Brigham, MD  carvedilol (COREG) 25 MG tablet Take 12.5 mg by mouth 2 (two) times daily with a meal.   Yes Historical Provider, MD  citalopram (CELEXA) 20 MG tablet Take 20 mg by mouth every morning.   Yes Historical Provider, MD  clopidogrel (PLAVIX) 75 MG tablet Take 75 mg by mouth 2 (two) times daily.    Yes Historical Provider, MD  ergocalciferol (VITAMIN D2) 50000 UNITS capsule Take 50,000 Units by mouth once a week. Friday   Yes Historical Provider, MD  hydroxypropyl methylcellulose / hypromellose (ISOPTO TEARS / GONIOVISC) 2.5 % ophthalmic solution Place 1 drop into both eyes 3 (three) times daily as needed for dry eyes.   Yes Historical Provider, MD  losartan-hydrochlorothiazide (HYZAAR) 100-25 MG per tablet Take 1 tablet by mouth daily.    Yes Historical Provider, MD  Multiple Vitamins-Minerals (PRESERVISION AREDS PO) Take 1 capsule by mouth 2 (two) times daily.   Yes Historical Provider, MD  vitamin B-12 (CYANOCOBALAMIN) 1000 MCG tablet Take 1,000 mcg by mouth every morning.   Yes Historical Provider, MD  predniSONE (DELTASONE) 5 MG tablet Take 2.5 mg by mouth every other day.      Historical Provider, MD     Vital Signs: BP 106/41 mmHg  Pulse 52  Temp(Src) 97.7 F (36.5 C) (Oral)  Resp 10  Ht 5\' 3"  (1.6 m)  Wt 155 lb 6.8 oz (70.5 kg)  BMI 27.54 kg/m2  SpO2 94%  Physical Exam awake/alert; face symm, speech nl, tongue midline, PERRL/EOMI, no drift, strength 5/5 all fours. sens fxn intact, FMM nl, F to N nl, puncture site rt CFA clean, soft, NT, no hematoma; intact distal pulses  Imaging: No results found.  Labs:  CBC:  Recent Labs  01/25/15 1342 01/25/15 1346 03/25/15 1451 05/16/15 1327  WBC 9.7  --  7.1 6.6  HGB 12.5 12.2 11.8* 11.3*  HCT 35.5* 36.0 34.5* 33.4*  PLT 196  --  240 243    COAGS:  Recent Labs  01/25/15 1342 03/25/15 1451 05/16/15 1327  INR 0.99 1.02 1.02  APTT 29 28 30     BMP:  Recent Labs  01/25/15 1342 01/25/15 1346 03/25/15 1451 05/16/15 1327  NA 141 140 139 139  K 4.2 4.0 4.1 4.0  CL 108 105 107 107  CO2 24  --  24 24  GLUCOSE 114* 110* 98 137*  BUN 16 18 18  22*  CALCIUM 10.4*  --  10.1 10.2  CREATININE 1.11* 1.00 1.24* 1.27*  GFRNONAA 43*  --  38* 37*  GFRAA 50*  --  44* 43*  LIVER FUNCTION TESTS:  Recent Labs  01/25/15 1342  BILITOT 0.9  AST 18  ALT 13*  ALKPHOS 68  PROT 6.7  ALBUMIN 3.9    Assessment and Plan: S/p pipeline stenting of cavernous  left ICA aneurysm 12/5; for overnight obs; cont IV heparin; maintain on ASA/plavix; neuro checks; cl liquid diet; cont home BP meds   Signed: D. Rowe Robert 05/20/2015, 3:49 PM   I spent a total of 15 minutes at the the patient's bedside AND on the patient's hospital floor or unit, greater than 50% of which was counseling/coordinating care for cerebral angio with stenting of left ICA aneurysm

## 2015-05-20 NOTE — Sedation Documentation (Signed)
6 Fr. Exoseal to right groin. 

## 2015-05-20 NOTE — Sedation Documentation (Signed)
Diagnostic procedure done. Dr. Estanislado Pandy speaking with pt and daughter regarding intervention. Both in agreement to proceed with intervention.

## 2015-05-21 ENCOUNTER — Other Ambulatory Visit: Payer: Self-pay | Admitting: Radiology

## 2015-05-21 ENCOUNTER — Encounter (HOSPITAL_COMMUNITY): Payer: Self-pay | Admitting: Interventional Radiology

## 2015-05-21 DIAGNOSIS — I671 Cerebral aneurysm, nonruptured: Secondary | ICD-10-CM

## 2015-05-21 LAB — BASIC METABOLIC PANEL
ANION GAP: 7 (ref 5–15)
BUN: 17 mg/dL (ref 6–20)
CALCIUM: 9.3 mg/dL (ref 8.9–10.3)
CO2: 24 mmol/L (ref 22–32)
CREATININE: 1.14 mg/dL — AB (ref 0.44–1.00)
Chloride: 109 mmol/L (ref 101–111)
GFR, EST AFRICAN AMERICAN: 49 mL/min — AB (ref >60)
GFR, EST NON AFRICAN AMERICAN: 42 mL/min — AB (ref >60)
Glucose, Bld: 88 mg/dL (ref 65–99)
Potassium: 3.5 mmol/L (ref 3.5–5.1)
Sodium: 140 mmol/L (ref 135–145)

## 2015-05-21 LAB — CBC WITH DIFFERENTIAL/PLATELET
BASOS ABS: 0 10*3/uL (ref 0.0–0.1)
BASOS PCT: 0 %
EOS ABS: 0 10*3/uL (ref 0.0–0.7)
Eosinophils Relative: 1 %
HEMATOCRIT: 28.6 % — AB (ref 36.0–46.0)
HEMOGLOBIN: 9.5 g/dL — AB (ref 12.0–15.0)
Lymphocytes Relative: 18 %
Lymphs Abs: 1.4 10*3/uL (ref 0.7–4.0)
MCH: 30.4 pg (ref 26.0–34.0)
MCHC: 33.2 g/dL (ref 30.0–36.0)
MCV: 91.7 fL (ref 78.0–100.0)
MONO ABS: 0.5 10*3/uL (ref 0.1–1.0)
MONOS PCT: 7 %
NEUTROS ABS: 5.6 10*3/uL (ref 1.7–7.7)
NEUTROS PCT: 74 %
Platelets: 210 10*3/uL (ref 150–400)
RBC: 3.12 MIL/uL — ABNORMAL LOW (ref 3.87–5.11)
RDW: 13.5 % (ref 11.5–15.5)
WBC: 7.6 10*3/uL (ref 4.0–10.5)

## 2015-05-21 LAB — PLATELET INHIBITION P2Y12: Platelet Function  P2Y12: 252 [PRU] (ref 194–418)

## 2015-05-21 LAB — HEPARIN LEVEL (UNFRACTIONATED): HEPARIN UNFRACTIONATED: 0.12 [IU]/mL — AB (ref 0.30–0.70)

## 2015-05-21 MED ORDER — CLOPIDOGREL BISULFATE 75 MG PO TABS
75.0000 mg | ORAL_TABLET | Freq: Once | ORAL | Status: AC
  Administered 2015-05-21: 75 mg via ORAL
  Filled 2015-05-21: qty 1

## 2015-05-21 MED ORDER — HEPARIN (PORCINE) IN NACL 100-0.45 UNIT/ML-% IJ SOLN
550.0000 [IU]/h | INTRAMUSCULAR | Status: DC
  Filled 2015-05-21: qty 250

## 2015-05-21 NOTE — Progress Notes (Signed)
ANTICOAGULATION CONSULT NOTE - Follow Up Consult  Pharmacy Consult for heparin Indication: post-embolization of L-internal carotid artery aneurysm  Allergies  Allergen Reactions  . Lidocaine-Epinephrine Other (See Comments)    Tachycardia; reports that she can tolerate plain lidocaine  . Lisinopril Other (See Comments)    cough    Patient Measurements: Height: 5\' 3"  (160 cm) Weight: 155 lb 6.8 oz (70.5 kg) IBW/kg (Calculated) : 52.4 Heparin Dosing Weight: ~67 kg  Vital Signs: Temp: 99 F (37.2 C) (12/06 0400) Temp Source: Oral (12/06 0400) BP: 106/34 mmHg (12/06 0500) Pulse Rate: 62 (12/06 0500)  Labs:  Recent Labs  05/20/15 2110 05/21/15 0530  HGB  --  9.5*  HCT  --  28.6*  PLT  --  210  HEPARINUNFRC 0.17* 0.12*    Estimated Creatinine Clearance: 29.4 mL/min (by C-G formula based on Cr of 1.27).  Assessment: 81 year old female s/p embolization of carotid artery aneurysm on IV heparin post-operatively.  Heparin level is 0.12 - therapeutic for low goal.  Heparin ends at 0700 for sheath removal.  Goal of Therapy:  Heparin level 0.1 to 0.25 units/ml aPTT 50-60 seconds Monitor platelets by anticoagulation protocol: Yes   Plan:  Continue heparin at 550 units/hr.  Heparin to d/c at [place], PharmD, BCPS Clinical pharmacist, pager [phone] 05/21/2015,6:12 AM

## 2015-05-21 NOTE — Plan of Care (Signed)
Problem: Phase II Progression Outcomes Goal: Ambulates up to 600 ft. in hall x 1 Outcome: Completed/Met Date Met:  05/21/15 Walked around unit with RN

## 2015-05-21 NOTE — Discharge Summary (Signed)
Patient ID: Tabitha Sanders MRN: [id number] DOB/AGE: 1925-08-22 81 y.o.  Admit date: 05/20/2015 Discharge date: 05/21/2015  Admission Diagnoses: Left ICA intracranial aneurysm   Discharge Diagnoses:  Active Problems:   Brain aneurysm  S/P Left common carotid arteriogram followed by placement of a 5 mm x 20 mm pipeline Flex device across wide neck complex lobulated aneurysmof the Left ICA at prox cavernous carotid level       Discharged Condition: Good, stable  Hospital Course: Tabitha Sanders is a 81 y.o. female suffered CVA 01/2015 and was hospitalized and seen with Dr Leonie Man. Work up included MRI/MRA 01/25/2015 that revealed 9 x 6 x 8 mm wide necked aneurysm arising from the cavernous left ICA. The patient was referred to Dr. Estanislado Pandy and was seen in consult on 02/12/15 and deemed a candidate for intervention.   She presented as an outpatient on 05/20/15 and underwent successful Left common carotid arteriogram followed by placement of a 5 mm x 20 mm pipeline Flex device across wide neck complex lobulated aneurysmof the Left ICA at prox cavernous carotid level with general anesthesia. She was extubated and admitted overnight in Neuro ICU with close observation. Her labs and vitals remained stable, IV heparin was discontinued this morning and she was given Plavix 75 mg (2) tablets and ASA 325 mg today, p2y12 this morning was 252, discussed with Dr. Estanislado Pandy and the patient was instructed to continue Plavix 75 mg twice daily and ASA 325 mg daily- she was given a Rx with 3 refills. She denies any headache today, she denies any vision changes or speech changes. She denies any extremity weakness or new neurological changes.   She has ate breakfast without nausea or vomiting. She has had her foley catheter removed and voided on her own without difficulty. She has ambulated without lightheadedness or dizziness. She denies any abdominal pain, chest pain, shortness of breath or palpitations.  She denies any active signs of bleeding or excessive bruising. She denies any right groin access site pain, swelling or bleeding. She denies any recent fever or chills. She states she is ready for discharge home and was given the below instructions and verbalizes understanding of the plan.   Consults: Anesthesia   Treatments:  S/P Left common carotid arteriogram followed by placement of a 5 mm x 20 mm pipeline Flex device across wide neck complex lobulated aneurysmof the Left ICA at prox cavernous carotid level.       Discharge Exam: Blood pressure 118/46, pulse 62, temperature 98.6 F (37 C), temperature source Oral, resp. rate 15, height 5\' 3"  (1.6 m), weight 155 lb 6.8 oz (70.5 kg), SpO2 94 %.  Physical Exam:  General: A&Ox3, NAD, sitting up in chair eating Heart: RRR Lungs: CTA b/l Abd: Soft, NT, ND, (+) BS Ext: RCFA access site dressing C/D/I- NT, soft, no signs of bleeding/hematoma or erythema, DP intact 1+ b/l, warm b/l Neuro: Grossly intact, speech clear, EOMI, smile symmetrical, tongue midline, shoulder shrug intact, fine motor intact, finger to nose coordination equal and intact, equal strength upper and lower extremities 5/5, dorsiflexion and plantarflexion intact b/l   Disposition: 06-Home-Health Care Svc  Discharge Instructions    Call MD for:  difficulty breathing, headache or visual disturbances    Complete by:  As directed      Call MD for:  extreme fatigue    Complete by:  As directed      Call MD for:  hives    Complete by:  As  directed      Call MD for:  persistant dizziness or light-headedness    Complete by:  As directed      Call MD for:  persistant nausea and vomiting    Complete by:  As directed      Call MD for:  redness, tenderness, or signs of infection (pain, swelling, redness, odor or green/yellow discharge around incision site)    Complete by:  As directed      Call MD for:  severe uncontrolled pain    Complete by:  As directed      Call MD for:   temperature >100.4    Complete by:  As directed      Diet - low sodium heart healthy    Complete by:  As directed      Driving Restrictions    Complete by:  As directed   No driving x 2 weeks     Increase activity slowly    Complete by:  As directed      Lifting restrictions    Complete by:  As directed   No lifting over 15 lbs, no bending or stooping for extended periods     Remove dressing in 24 hours    Complete by:  As directed             Medication List    TAKE these medications        acetaminophen 500 MG tablet  Commonly known as:  TYLENOL  Take 500 mg by mouth every 6 (six) hours as needed for mild pain.     amLODipine 10 MG tablet  Commonly known as:  NORVASC  Take 10 mg by mouth daily.     aspirin 325 MG EC tablet  Take 1 tablet (325 mg total) by mouth daily.     atorvastatin 40 MG tablet  Commonly known as:  LIPITOR  Take 1 tablet (40 mg total) by mouth daily at 6 PM.     carvedilol 25 MG tablet  Commonly known as:  COREG  Take 12.5 mg by mouth 2 (two) times daily with a meal.     citalopram 20 MG tablet  Commonly known as:  CELEXA  Take 20 mg by mouth every morning.     clopidogrel 75 MG tablet  Commonly known as:  PLAVIX  Take 75 mg by mouth 2 (two) times daily.     ergocalciferol 50000 UNITS capsule  Commonly known as:  VITAMIN D2  Take 50,000 Units by mouth once a week. Friday     hydroxypropyl methylcellulose / hypromellose 2.5 % ophthalmic solution  Commonly known as:  ISOPTO TEARS / GONIOVISC  Place 1 drop into both eyes 3 (three) times daily as needed for dry eyes.     losartan-hydrochlorothiazide 100-25 MG tablet  Commonly known as:  HYZAAR  Take 1 tablet by mouth daily.     predniSONE 5 MG tablet  Commonly known as:  DELTASONE  Take 2.5 mg by mouth every other day.     PRESERVISION AREDS PO  Take 1 capsule by mouth 2 (two) times daily.     vitamin B-12 1000 MCG tablet  Commonly known as:  CYANOCOBALAMIN  Take 1,000 mcg by  mouth every morning.           Follow-up Information    Follow up with DEVESHWAR, Fritz Pickerel, MD In 2 weeks.   Specialty:  Interventional Radiology   Why:  Our office will call patient with appointment [phone]  Contact information:   [address] [place] [address] [phone]        Signed: Hedy Jacob 05/21/2015, 12:07 PM   I have spent Greater Than 30 Minutes discharging Tabitha Sanders.

## 2015-05-21 NOTE — Care Management Note (Signed)
Case Management Note  Patient Details  Name: CELINA ATHERLEY MRN: [id number] Date of Birth: 1926-05-05  Subjective/Objective:  Pt admitted on 05/20/15 s/p embolization of Lt ICA aneurysm.  PTA, pt independent, resides at home with spouse.              Action/Plan: Pt for dc home today with spouse.  No dc needs identified.    Expected Discharge Date:                  Expected Discharge Plan:  Home/Self Care  In-House Referral:     Discharge planning Services  CM Consult  Post Acute Care Choice:    Choice offered to:     DME Arranged:    DME Agency:     HH Arranged:    [place] Agency:     Status of Service:  Completed, signed off  Medicare Important Message Given:    Date Medicare IM Given:    Medicare IM give by:    Date Additional Medicare IM Given:    Additional Medicare Important Message give by:     If discussed at [place] of Stay Meetings, dates discussed:    Additional Comments:  Reinaldo Raddle, RN, BSN  Trauma/Neuro ICU Case Manager [phone]

## 2015-05-23 ENCOUNTER — Telehealth (HOSPITAL_COMMUNITY): Payer: Self-pay | Admitting: *Deleted

## 2015-05-23 NOTE — Telephone Encounter (Signed)
Called pharmacy it is to soon for patient to get refill on medication.  Will discuss with Dr Estanislado Pandy in the am when he returns to office.

## 2015-05-24 ENCOUNTER — Telehealth (HOSPITAL_COMMUNITY): Payer: Self-pay | Admitting: *Deleted

## 2015-05-24 NOTE — Telephone Encounter (Signed)
Called and spoke with [facility] and then called patient.  Patient will pay for a 30 day supply and  On 05/29/15 insurance will pay for a 30day supply for patient.  So she can continue 2 plavix daily.  Patient will go pick up presciption today.

## 2015-05-28 ENCOUNTER — Encounter: Payer: Self-pay | Admitting: *Deleted

## 2015-05-28 NOTE — Telephone Encounter (Signed)
Not a patient at [facility]. Velora Heckler, RN

## 2015-06-07 ENCOUNTER — Ambulatory Visit (HOSPITAL_COMMUNITY): Admission: RE | Admit: 2015-06-07 | Payer: Medicare Other | Source: Ambulatory Visit

## 2015-06-20 ENCOUNTER — Encounter: Payer: Self-pay | Admitting: Neurology

## 2015-06-20 ENCOUNTER — Ambulatory Visit (INDEPENDENT_AMBULATORY_CARE_PROVIDER_SITE_OTHER): Payer: Medicare Other | Admitting: Neurology

## 2015-06-20 VITALS — BP 118/51 | HR 47 | Ht 62.0 in | Wt 160.6 lb

## 2015-06-20 DIAGNOSIS — I671 Cerebral aneurysm, nonruptured: Secondary | ICD-10-CM

## 2015-06-20 NOTE — Patient Instructions (Addendum)
I had a long d/w patient about her recent stroke, left cavernous ICA aneurysm, risk for recurrent stroke/TIAs, personally independently reviewed imaging studies and stroke evaluation results and answered questions.Continue aspirin 325 mg daily and clopidogrel 75 mg daily  for secondary stroke prevention given recent stent assisted aneurysm coiling and maintain strict control of hypertension with blood pressure goal below 130/90, diabetes with hemoglobin A1c goal below 6.5% and lipids with LDL cholesterol goal below 70 mg/dL. I also advised the patient to eat a healthy diet with plenty of whole grains, cereals, fruits and vegetables, exercise regularly and maintain ideal body weight. I advised her to see Dr. Estanislado Pandy to discuss discontinuing Plavix in a few months due to her increased bruising. Followup in the future with me in one year or call earlier if necessary

## 2015-06-20 NOTE — Progress Notes (Signed)
[facility] [address] [place]. [place] [address] [phone]       OFFICE FOLLOW UP VISIT NOTE  Tabitha. Tabitha Sanders Date of Birth:  1926-05-25 Medical Record Number:  [id number]   Referring MD:  Dr Estanislado Pandy  Reason for Referral:  Stroke and aneurysm  HPI: Tabitha Sanders is a pleasant 82 year old Caucasian lady who had a transient episode of being off balance and gait ataxia on 01/25/15.  She also had transient nausea and vertigo.She was admitted for evaluation and MRI scan showed a tiny right punctate internal capsule infarct. Also MRA showed  9 x 6 x 8 mm wide neck aneurysm arising from the left cavernous carotid directly inferiorly and slightly medially. There was also a smaller associated outpouching of 3.5 mm in the right posterior communicating artery region which could be aneurysm or infundibulum. There was no other large vessel intracranial stenosis or occlusion. Carotid ultrasound and transthoracic echo were both unremarkable. LDL cholesterol was 86 mg percent with triglycerides 351 and total cholesterol 186. Hemoglobin A1c was 5.4. The patient has 2 sisters who had brain aneurysms requiring surgery and hence she is very anxious about risk of rupture, morbidity and mortality. She has already seen Dr. Estanislado Pandy mesh were on 8/ 30/16 for consultation and is planning to have elective aneurysm coiling and treatment soon. She has no prior history of ruptured brain aneurysm, intracerebral hemorrhage, fibromuscular dysplasia or any other connective tissue disorder. The patient does have hypertension and hyperlipidemia and feels they're under good control. Her blood pressure today is 139/65. She is currently on aspirin 325 mg daily which is tolerating well without bleeding or bruising. She had follow-up lipid profile checked earlier this week but the results of pending. Update 06/20/2015 : Patient returns for follow-up after last visit 3 months ago. She continues to do well  without recurrent stroke or TIA symptoms. She saw Dr. Caro Hight had stent assisted coiling of the left cavernous carotid artery aneurysm on 05/20/15. Procedure went well. She is on aspirin and Plavix and complains of lot of bruising though no bleeding episodes. He states her blood pressure is well controlled and today it is 118/51. She has no new complaints. She plans to see Dr. Estanislado Pandy next month for follow-up. ROS:   14 system review of systems is positive for  no complaints today and all the systems negative  PMH:  Past Medical History  Diagnosis Date  . Hypertension   . Depression   . Anemia     iron deficiency anemia and pernicious  . Stroke [facility]) January 25, 2015    No deficits  . HOH (hard of hearing)     wears bilateral hearing aids  . Polyarthritis rheumatica (HCC)     Social History:  Social History   Social History  . Marital Status: Married    Spouse Name: N/A  . Number of Children: N/A  . Years of Education: N/A   Occupational History  . Not on file.   Social History Main Topics  . Smoking status: Never Smoker   . Smokeless tobacco: Not on file  . Alcohol Use: No  . Drug Use: No  . Sexual Activity: Not on file   Other Topics Concern  . Not on file   Social History Narrative    Medications:   Current Outpatient Prescriptions on File Prior to Visit  Medication Sig Dispense Refill  . acetaminophen (TYLENOL) 500 MG tablet Take 500 mg by mouth every 6 (six) hours as needed  for mild pain.    Marland Kitchen amLODipine (NORVASC) 10 MG tablet Take 10 mg by mouth daily.  6  . aspirin EC 325 MG EC tablet Take 1 tablet (325 mg total) by mouth daily. 30 tablet 0  . atorvastatin (LIPITOR) 40 MG tablet Take 1 tablet (40 mg total) by mouth daily at 6 PM. (Patient taking differently: Take 40 mg by mouth every evening. ) 30 tablet 3  . carvedilol (COREG) 25 MG tablet Take 12.5 mg by mouth 2 (two) times daily with a meal.    . citalopram (CELEXA) 20 MG tablet Take 20 mg by mouth  every morning.    . clopidogrel (PLAVIX) 75 MG tablet Take 75 mg by mouth 2 (two) times daily.     . ergocalciferol (VITAMIN D2) 50000 UNITS capsule Take 50,000 Units by mouth once a week. Friday    . hydroxypropyl methylcellulose / hypromellose (ISOPTO TEARS / GONIOVISC) 2.5 % ophthalmic solution Place 1 drop into both eyes 3 (three) times daily as needed for dry eyes.    Marland Kitchen losartan-hydrochlorothiazide (HYZAAR) 100-25 MG per tablet Take 1 tablet by mouth daily.     . Multiple Vitamins-Minerals (PRESERVISION AREDS PO) Take 1 capsule by mouth 2 (two) times daily.    . predniSONE (DELTASONE) 5 MG tablet Take 2.5 mg by mouth every other day.     . vitamin B-12 (CYANOCOBALAMIN) 1000 MCG tablet Take 1,000 mcg by mouth every morning.     No current facility-administered medications on file prior to visit.    Allergies:   Allergies  Allergen Reactions  . Lidocaine-Epinephrine Other (See Comments)    Tachycardia; reports that she can tolerate plain lidocaine  . Lisinopril Other (See Comments)    cough    Physical Exam General: well developed, well nourished  Elderly Caucasian lady, seated, in no evident distress Head: head normocephalic and atraumatic.   Neck: supple with no carotid or supraclavicular bruits Cardiovascular: regular rate and rhythm, no murmurs Musculoskeletal: no deformity Skin:  no rash/petichiae Vascular:  Normal pulses all extremities  Neurologic Exam Mental Status: Awake and fully alert. Oriented to place and time. Recent and remote memory intact. Attention span, concentration and fund of knowledge appropriate. Mood and affect appropriate.  Cranial Nerves: Fundoscopic exam not done  . Pupils equal, briskly reactive to light. Extraocular movements full without nystagmus. Visual fields full to confrontation. Hearing intact. Facial sensation intact. Face, tongue, palate moves normally and symmetrically.  Motor: Normal bulk and tone. Normal strength in all tested extremity  muscles. Sensory.: intact to touch , pinprick , position and vibratory sensation.  Coordination: Rapid alternating movements normal in all extremities. Finger-to-nose and heel-to-shin performed accurately bilaterally. Gait and Station: Arises from chair without difficulty. Stance is normal. Gait demonstrates normal stride length and balance . Able to heel, toe and tandem walk without difficulty.  Reflexes: 1+ and symmetric. Toes downgoing.       ASSESSMENT: 70 year  Caucasian lady with small right internal capsule infarct in August 20 16 from small vessel disease with incidental finding of left cavernous carotid aneurysm status post stent assisted coiling in December 2016. Vascular risk factors of hypertension and hyperlipidemia.    PLAN: I had a long d/w patient about her recent stroke, left cavernous ICA aneurysm, risk for recurrent stroke/TIAs, personally independently reviewed imaging studies and stroke evaluation results and answered questions.Continue aspirin 325 mg daily and clopidogrel 75 mg daily  for secondary stroke prevention given recent stent assisted aneurysm coiling and maintain  strict control of hypertension with blood pressure goal below 130/90, diabetes with hemoglobin A1c goal below 6.5% and lipids with LDL cholesterol goal below 70 mg/dL. I also advised the patient to eat a healthy diet with plenty of whole grains, cereals, fruits and vegetables, exercise regularly and maintain ideal body weight. I advised her to see Dr. Estanislado Pandy to discuss discontinuing Plavix in a few months due to her increased bruising. Followup in the future with me in one year or call earlier if necessary  Tabitha Contras, MD  Note: This document was prepared with digital dictation and possible smart phrase technology. Any transcriptional errors that result from this process are unintentional.

## 2015-07-02 ENCOUNTER — Ambulatory Visit (HOSPITAL_COMMUNITY)
Admission: RE | Admit: 2015-07-02 | Discharge: 2015-07-02 | Disposition: A | Discharge status: Home / Self Care | Payer: Medicare Other | Source: Ambulatory Visit | Attending: Radiology | Admitting: Radiology

## 2015-07-02 ENCOUNTER — Other Ambulatory Visit: Payer: Self-pay | Admitting: Radiology

## 2015-07-02 DIAGNOSIS — I671 Cerebral aneurysm, nonruptured: Secondary | ICD-10-CM | POA: Diagnosis present

## 2015-07-02 LAB — PLATELET INHIBITION P2Y12: Platelet Function  P2Y12: 17 [PRU] — ABNORMAL LOW (ref 194–418)

## 2015-07-03 ENCOUNTER — Telehealth (HOSPITAL_COMMUNITY): Payer: Self-pay | Admitting: *Deleted

## 2015-07-03 NOTE — Telephone Encounter (Signed)
Tried to call patient and voicemail was full, could not leave message.  Will retry tomorrow.

## 2015-07-09 ENCOUNTER — Telehealth (HOSPITAL_COMMUNITY): Payer: Self-pay | Admitting: *Deleted

## 2015-07-09 NOTE — Telephone Encounter (Signed)
Attempted patient telephone number again.  No answer and not able to leave VM.  Called and left voice mail for Daphene Jaeger with instructions per Dr. Estanislado Pandy that patient is to take Plavix 75mg  1 tablet daily and to continue her ASA 325mg   Dose.  Left call back number for concerns or questions

## 2015-07-11 ENCOUNTER — Telehealth (HOSPITAL_COMMUNITY): Payer: Self-pay | Admitting: *Deleted

## 2015-07-11 NOTE — Telephone Encounter (Signed)
Returned call to patient, vreified that she is take 1 75mg  Plavix daily and ASA 325mg  daily.  Pt repeated instructions.

## 2015-07-23 DIAGNOSIS — F3340 Major depressive disorder, recurrent, in remission, unspecified: Secondary | ICD-10-CM | POA: Diagnosis not present

## 2015-07-23 DIAGNOSIS — D649 Anemia, unspecified: Secondary | ICD-10-CM | POA: Diagnosis not present

## 2015-07-23 DIAGNOSIS — Z79899 Other long term (current) drug therapy: Secondary | ICD-10-CM | POA: Diagnosis not present

## 2015-07-23 DIAGNOSIS — I129 Hypertensive chronic kidney disease with stage 1 through stage 4 chronic kidney disease, or unspecified chronic kidney disease: Secondary | ICD-10-CM | POA: Diagnosis not present

## 2015-07-23 DIAGNOSIS — E7800 Pure hypercholesterolemia, unspecified: Secondary | ICD-10-CM | POA: Diagnosis not present

## 2015-07-23 DIAGNOSIS — N183 Chronic kidney disease, stage 3 (moderate): Secondary | ICD-10-CM | POA: Diagnosis not present

## 2015-07-23 DIAGNOSIS — D509 Iron deficiency anemia, unspecified: Secondary | ICD-10-CM | POA: Diagnosis not present

## 2015-07-23 DIAGNOSIS — Z0000 Encounter for general adult medical examination without abnormal findings: Secondary | ICD-10-CM | POA: Diagnosis not present

## 2015-07-23 DIAGNOSIS — D510 Vitamin B12 deficiency anemia due to intrinsic factor deficiency: Secondary | ICD-10-CM | POA: Diagnosis not present

## 2015-08-29 ENCOUNTER — Other Ambulatory Visit (HOSPITAL_COMMUNITY): Payer: Self-pay | Admitting: Interventional Radiology

## 2015-08-29 DIAGNOSIS — I729 Aneurysm of unspecified site: Secondary | ICD-10-CM

## 2015-09-04 DIAGNOSIS — D509 Iron deficiency anemia, unspecified: Secondary | ICD-10-CM | POA: Diagnosis not present

## 2015-09-23 ENCOUNTER — Ambulatory Visit (HOSPITAL_COMMUNITY): Payer: Medicare Other

## 2015-09-23 ENCOUNTER — Ambulatory Visit (HOSPITAL_COMMUNITY)
Admission: RE | Admit: 2015-09-23 | Discharge: 2015-09-23 | Disposition: A | Discharge status: Home / Self Care | Payer: Medicare Other | Source: Ambulatory Visit | Attending: Interventional Radiology | Admitting: Interventional Radiology

## 2015-09-23 DIAGNOSIS — I729 Aneurysm of unspecified site: Secondary | ICD-10-CM

## 2015-09-23 DIAGNOSIS — I671 Cerebral aneurysm, nonruptured: Secondary | ICD-10-CM | POA: Diagnosis not present

## 2015-09-23 DIAGNOSIS — I720 Aneurysm of carotid artery: Secondary | ICD-10-CM | POA: Insufficient documentation

## 2015-09-23 LAB — CREATININE, SERUM
Creatinine, Ser: 1.31 mg/dL — ABNORMAL HIGH (ref 0.44–1.00)
GFR calc non Af Amer: 35 mL/min — ABNORMAL LOW (ref >60)
GFR, EST AFRICAN AMERICAN: 41 mL/min — AB (ref >60)

## 2015-09-23 MED ORDER — GADOBENATE DIMEGLUMINE 529 MG/ML IV SOLN
10.0000 mL | Freq: Once | INTRAVENOUS | Status: AC
  Administered 2015-09-23: 8 mL via INTRAVENOUS

## 2015-10-17 ENCOUNTER — Telehealth (HOSPITAL_COMMUNITY): Payer: Self-pay

## 2015-10-17 DIAGNOSIS — D509 Iron deficiency anemia, unspecified: Secondary | ICD-10-CM | POA: Diagnosis not present

## 2015-10-17 NOTE — Telephone Encounter (Signed)
Pt agreed to f/u in 6 months with MRI/MRA with Dr. Estanislado Pandy. AW

## 2015-10-21 DIAGNOSIS — I129 Hypertensive chronic kidney disease with stage 1 through stage 4 chronic kidney disease, or unspecified chronic kidney disease: Secondary | ICD-10-CM | POA: Diagnosis not present

## 2015-10-21 DIAGNOSIS — M25552 Pain in left hip: Secondary | ICD-10-CM | POA: Diagnosis not present

## 2015-10-21 DIAGNOSIS — D509 Iron deficiency anemia, unspecified: Secondary | ICD-10-CM | POA: Diagnosis not present

## 2015-10-21 DIAGNOSIS — M25551 Pain in right hip: Secondary | ICD-10-CM | POA: Diagnosis not present

## 2015-10-21 DIAGNOSIS — F419 Anxiety disorder, unspecified: Secondary | ICD-10-CM | POA: Diagnosis not present

## 2015-10-21 DIAGNOSIS — N183 Chronic kidney disease, stage 3 (moderate): Secondary | ICD-10-CM | POA: Diagnosis not present

## 2015-12-05 DIAGNOSIS — H524 Presbyopia: Secondary | ICD-10-CM | POA: Diagnosis not present

## 2016-02-06 ENCOUNTER — Telehealth (HOSPITAL_COMMUNITY): Payer: Self-pay | Admitting: Radiology

## 2016-02-06 NOTE — Telephone Encounter (Signed)
Ok to refill pt's Plavix 75mg  1 daily x90 tablets with 3 refills per Deveshwar JM

## 2016-02-19 DIAGNOSIS — I129 Hypertensive chronic kidney disease with stage 1 through stage 4 chronic kidney disease, or unspecified chronic kidney disease: Secondary | ICD-10-CM | POA: Diagnosis not present

## 2016-02-19 DIAGNOSIS — Z23 Encounter for immunization: Secondary | ICD-10-CM | POA: Diagnosis not present

## 2016-02-19 DIAGNOSIS — F3340 Major depressive disorder, recurrent, in remission, unspecified: Secondary | ICD-10-CM | POA: Diagnosis not present

## 2016-02-19 DIAGNOSIS — N183 Chronic kidney disease, stage 3 (moderate): Secondary | ICD-10-CM | POA: Diagnosis not present

## 2016-04-09 ENCOUNTER — Other Ambulatory Visit (HOSPITAL_COMMUNITY): Payer: Self-pay | Admitting: Interventional Radiology

## 2016-04-09 DIAGNOSIS — I729 Aneurysm of unspecified site: Secondary | ICD-10-CM

## 2016-04-20 ENCOUNTER — Ambulatory Visit (HOSPITAL_COMMUNITY)
Admission: RE | Admit: 2016-04-20 | Discharge: 2016-04-20 | Disposition: A | Discharge status: Home / Self Care | Payer: Medicare Other | Source: Ambulatory Visit | Attending: Interventional Radiology | Admitting: Interventional Radiology

## 2016-04-20 ENCOUNTER — Ambulatory Visit (HOSPITAL_COMMUNITY): Payer: Medicare Other

## 2016-04-20 DIAGNOSIS — I720 Aneurysm of carotid artery: Secondary | ICD-10-CM | POA: Insufficient documentation

## 2016-04-20 DIAGNOSIS — I671 Cerebral aneurysm, nonruptured: Secondary | ICD-10-CM | POA: Diagnosis not present

## 2016-04-20 DIAGNOSIS — I729 Aneurysm of unspecified site: Secondary | ICD-10-CM | POA: Diagnosis present

## 2016-04-20 LAB — CREATININE, SERUM
Creatinine, Ser: 1.22 mg/dL — ABNORMAL HIGH (ref 0.44–1.00)
GFR, EST AFRICAN AMERICAN: 44 mL/min — AB (ref >60)
GFR, EST NON AFRICAN AMERICAN: 38 mL/min — AB (ref >60)

## 2016-04-20 MED ORDER — GADOBENATE DIMEGLUMINE 529 MG/ML IV SOLN
10.0000 mL | Freq: Once | INTRAVENOUS | Status: AC | PRN
  Administered 2016-04-20: 10 mL via INTRAVENOUS

## 2016-04-27 DIAGNOSIS — H608X3 Other otitis externa, bilateral: Secondary | ICD-10-CM | POA: Diagnosis not present

## 2016-04-27 DIAGNOSIS — H9113 Presbycusis, bilateral: Secondary | ICD-10-CM | POA: Diagnosis not present

## 2016-04-27 DIAGNOSIS — H6121 Impacted cerumen, right ear: Secondary | ICD-10-CM | POA: Diagnosis not present

## 2016-05-05 ENCOUNTER — Telehealth (HOSPITAL_COMMUNITY): Payer: Self-pay

## 2016-05-05 NOTE — Telephone Encounter (Signed)
Pt agreed to f/u in 6 months. Per Deveshwar if BUN/creatinine is ok we will do an angiogram. If not then we will schedule a MRI/MRA. Pt is ok with plan. AW

## 2016-06-18 ENCOUNTER — Encounter: Payer: Self-pay | Admitting: Neurology

## 2016-06-18 ENCOUNTER — Ambulatory Visit (INDEPENDENT_AMBULATORY_CARE_PROVIDER_SITE_OTHER): Payer: Medicare Other | Admitting: Neurology

## 2016-06-18 VITALS — BP 143/64 | HR 50 | Ht 62.0 in | Wt 162.6 lb

## 2016-06-18 DIAGNOSIS — I6529 Occlusion and stenosis of unspecified carotid artery: Secondary | ICD-10-CM

## 2016-06-18 NOTE — Progress Notes (Signed)
[facility] [address] [place]. [place] [address] [phone]       OFFICE FOLLOW UP VISIT NOTE  [name] Date of Birth:  03-26-1926 Medical Record Number:  [id number]   Referring MD:  Dr Tabitha Sanders  Reason for Referral:  Stroke and aneurysm  HPI: Ms Tabitha Sanders is a pleasant 83 year old Caucasian lady who had a transient episode of being off balance and gait ataxia on 01/25/15.  She also had transient nausea and vertigo.She was admitted for evaluation and MRI scan showed a tiny right punctate internal capsule infarct. Also MRA showed  9 x 6 x 8 mm wide neck aneurysm arising from the left cavernous carotid directly inferiorly and slightly medially. There was also a smaller associated outpouching of 3.5 mm in the right posterior communicating artery region which could be aneurysm or infundibulum. There was no other large vessel intracranial stenosis or occlusion. Carotid ultrasound and transthoracic echo were both unremarkable. LDL cholesterol was 86 mg percent with triglycerides 351 and total cholesterol 186. Hemoglobin A1c was 5.4. The patient has 2 sisters who had brain aneurysms requiring surgery and hence she is very anxious about risk of rupture, morbidity and mortality. She has already seen Dr. Estanislado Sanders mesh were on 8/ 30/16 for consultation and is planning to have elective aneurysm coiling and treatment soon. She has no prior history of ruptured brain aneurysm, intracerebral hemorrhage, fibromuscular dysplasia or any other connective tissue disorder. The patient does have hypertension and hyperlipidemia and feels they're under good control. Her blood pressure today is 139/65. She is currently on aspirin 325 mg daily which is tolerating well without bleeding or bruising. She had follow-up lipid profile checked earlier this week but the results of pending. Update 06/20/2015 : Patient returns for follow-up after last visit 3 months ago. She continues to do well  without recurrent stroke or TIA symptoms. She saw Dr. Caro Sanders had stent assisted coiling of the left cavernous carotid artery aneurysm on 05/20/15. Procedure went well. She is on aspirin and Plavix and complains of lot of bruising though no bleeding episodes. He states her blood pressure is well controlled and today it is 118/51. She has no new complaints. She plans to see Dr. Estanislado Sanders next month for follow-up. Update 06/18/2016 :She returns for follow-up after last visit a year ago. She presented to do well without recurrent stroke TIA symptoms. She remains on aspirin and Plavix and does complain of easy bruising. It is unclear whether she has discussed discontinuing Plavix with Dr. Estanislado Sanders or not. She did undergo follow-up MRI scan of the brain and MRA of the brain in April as well as in November 2017 which showed no new findings. She states her blood pressure is well controlled and today it is slightly high at 143/69. She remains on Lipitor which is tolerating well without side effects. She had lipid profile checked only once last year and plans to do so at upcoming visit with primary physician next month. She has no other new complaints.   ROS:  14 system review of systems is positive for   hearing loss,  , easy bruising, aching muscles, frequent waking, depression and all the systems negative  PMH:  Past Medical History:  Diagnosis Date  . Anemia    iron deficiency anemia and pernicious  . Depression   . HOH (hard of hearing)    wears bilateral hearing aids  . Hypertension   . Polyarthritis rheumatica ([place])   . Stroke [facility]) January 25, 2015  No deficits    Social History:  Social History   Social History  . Marital status: Married    Spouse name: N/A  . Number of children: N/A  . Years of education: N/A   Occupational History  . Not on file.   Social History Main Topics  . Smoking status: Never Smoker  . Smokeless tobacco: Never Used  . Alcohol use No  . Drug use: No  .  Sexual activity: Not on file   Other Topics Concern  . Not on file   Social History Narrative  . No narrative on file    Medications:   Current Outpatient Prescriptions on File Prior to Visit  Medication Sig Dispense Refill  . acetaminophen (TYLENOL) 500 MG tablet Take 500 mg by mouth every 6 (six) hours as needed for mild pain.    Marland Kitchen amLODipine (NORVASC) 10 MG tablet Take 10 mg by mouth daily.  6  . aspirin EC 325 MG EC tablet Take 1 tablet (325 mg total) by mouth daily. 30 tablet 0  . clopidogrel (PLAVIX) 75 MG tablet Take 75 mg by mouth daily.     . ergocalciferol (VITAMIN D2) 50000 UNITS capsule Take 50,000 Units by mouth once a week. Friday    . hydroxypropyl methylcellulose / hypromellose (ISOPTO TEARS / GONIOVISC) 2.5 % ophthalmic solution Place 1 drop into both eyes 3 (three) times daily as needed for dry eyes.    Marland Kitchen losartan-hydrochlorothiazide (HYZAAR) 100-25 MG per tablet Take 1 tablet by mouth daily.     . Multiple Vitamins-Minerals (PRESERVISION AREDS PO) Take 1 capsule by mouth 2 (two) times daily.    . vitamin B-12 (CYANOCOBALAMIN) 1000 MCG tablet Take 1,000 mcg by mouth every morning.     No current facility-administered medications on file prior to visit.     Allergies:   Allergies  Allergen Reactions  . Lidocaine-Epinephrine Other (See Comments)    Tachycardia; reports that she can tolerate plain lidocaine  . Lisinopril Other (See Comments)    cough    Physical Exam General: well developed, well nourished  elderly Caucasian lady, seated, in no evident distress Head: head normocephalic and atraumatic.   Neck: supple with no carotid or supraclavicular bruits Cardiovascular: regular rate and rhythm, no murmurs Musculoskeletal: no deformity Skin:  no rash/petichiae Vascular:  Normal pulses all extremities  Neurologic Exam Mental Status: Awake and fully alert. Oriented to place and time. Recent and remote memory intact. Attention span, concentration and fund of  knowledge appropriate. Mood and affect appropriate.  Cranial Nerves: Fundoscopic exam not done  . Pupils equal, briskly reactive to light. Extraocular movements full without nystagmus. Visual fields full to confrontation. Hearing intact. Facial sensation intact. Face, tongue, palate moves normally and symmetrically.  Motor: Normal bulk and tone. Normal strength in all tested extremity muscles. Sensory.: intact to touch , pinprick , position and vibratory sensation.  Coordination: Rapid alternating movements normal in all extremities. Finger-to-nose and heel-to-shin performed accurately bilaterally. Gait and Station: Arises from chair without difficulty. Stance is normal. Gait demonstrates normal stride length and balance . Able to heel, toe and tandem walk without difficulty.  Reflexes: 1+ and symmetric. Toes downgoing.       ASSESSMENT: 39 year  Caucasian lady with small right internal capsule infarct in August 20 16 from small vessel disease with incidental finding of left cavernous carotid aneurysm status post stent assisted coiling in December 2016. Vascular risk factors of hypertension and hyperlipidemia.    PLAN: I had a  long d/w patient about her remote stroke and brain aneurysms, risk for recurrent stroke/TIAs, aneurysm rupture, personally independently reviewed imaging studies and stroke evaluation results and answered questions.Continue aspirin 325 mg daily  for secondary stroke prevention  and discontinue Plavix as it has been more than 6 months since her pipeline stent and she is complaining of increasing bruising and maintain strict control of hypertension with blood pressure goal below 130/90, diabetes with hemoglobin A1c goal below 6.5% and lipids with LDL cholesterol goal below 70 mg/dL. I also advised the patient to eat a healthy diet with plenty of whole grains, cereals, fruits and vegetables, exercise regularly and maintain ideal body weight. Check screening follow-up carotid  ultrasound study. Greater than 50% time during this 25 minute visit was spent in counseling and coordination of care about her stroke and brain  aneurysms and answering questions Followup in the future with me only as necessary and no scheduled appointment was made  Antony Contras, MD  Note: This document was prepared with digital dictation and possible smart phrase technology. Any transcriptional errors that result from this process are unintentional.

## 2016-06-18 NOTE — Patient Instructions (Signed)
I had a long d/w patient about her remote stroke and brain aneurysms, risk for recurrent stroke/TIAs, aneurysm rupture, personally independently reviewed imaging studies and stroke evaluation results and answered questions.Continue aspirin 325 mg daily  for secondary stroke prevention  and discontinue Plavix as it has been more than 6 months since her pipeline stent and she is complaining of increasing bruising and maintain strict control of hypertension with blood pressure goal below 130/90, diabetes with hemoglobin A1c goal below 6.5% and lipids with LDL cholesterol goal below 70 mg/dL. I also advised the patient to eat a healthy diet with plenty of whole grains, cereals, fruits and vegetables, exercise regularly and maintain ideal body weight. Check screening follow-up carotid ultrasound study. Followup in the future with me only as necessary and no scheduled appointment was made

## 2016-06-23 DIAGNOSIS — H903 Sensorineural hearing loss, bilateral: Secondary | ICD-10-CM | POA: Diagnosis not present

## 2016-07-01 ENCOUNTER — Other Ambulatory Visit: Payer: Medicare Other

## 2016-07-09 ENCOUNTER — Other Ambulatory Visit: Payer: Medicare Other

## 2016-07-16 ENCOUNTER — Ambulatory Visit (INDEPENDENT_AMBULATORY_CARE_PROVIDER_SITE_OTHER): Payer: Medicare Other

## 2016-07-16 DIAGNOSIS — I6529 Occlusion and stenosis of unspecified carotid artery: Secondary | ICD-10-CM

## 2016-07-28 DIAGNOSIS — F3340 Major depressive disorder, recurrent, in remission, unspecified: Secondary | ICD-10-CM | POA: Diagnosis not present

## 2016-07-28 DIAGNOSIS — N183 Chronic kidney disease, stage 3 (moderate): Secondary | ICD-10-CM | POA: Diagnosis not present

## 2016-07-28 DIAGNOSIS — I129 Hypertensive chronic kidney disease with stage 1 through stage 4 chronic kidney disease, or unspecified chronic kidney disease: Secondary | ICD-10-CM | POA: Diagnosis not present

## 2016-07-28 DIAGNOSIS — Z0000 Encounter for general adult medical examination without abnormal findings: Secondary | ICD-10-CM | POA: Diagnosis not present

## 2016-08-03 ENCOUNTER — Telehealth: Payer: Self-pay

## 2016-08-03 NOTE — Telephone Encounter (Signed)
Rn call patient that her carotid ultrasound showed not significant stenosis.Patient verbalized understanding.

## 2016-10-19 ENCOUNTER — Telehealth (HOSPITAL_COMMUNITY): Payer: Self-pay

## 2016-10-19 ENCOUNTER — Other Ambulatory Visit (HOSPITAL_COMMUNITY): Payer: Self-pay | Admitting: Interventional Radiology

## 2016-10-19 DIAGNOSIS — I729 Aneurysm of unspecified site: Secondary | ICD-10-CM

## 2016-11-11 ENCOUNTER — Ambulatory Visit (HOSPITAL_COMMUNITY): Payer: Medicare Other

## 2016-11-11 ENCOUNTER — Encounter (HOSPITAL_COMMUNITY): Payer: Self-pay

## 2016-11-11 ENCOUNTER — Ambulatory Visit (HOSPITAL_COMMUNITY)
Admission: RE | Admit: 2016-11-11 | Discharge: 2016-11-11 | Disposition: A | Discharge status: Home / Self Care | Payer: Medicare Other | Source: Ambulatory Visit | Attending: Interventional Radiology | Admitting: Interventional Radiology

## 2016-11-11 DIAGNOSIS — I6782 Cerebral ischemia: Secondary | ICD-10-CM | POA: Diagnosis not present

## 2016-11-11 DIAGNOSIS — I729 Aneurysm of unspecified site: Secondary | ICD-10-CM | POA: Insufficient documentation

## 2016-11-11 DIAGNOSIS — I671 Cerebral aneurysm, nonruptured: Secondary | ICD-10-CM | POA: Diagnosis not present

## 2016-11-11 DIAGNOSIS — I720 Aneurysm of carotid artery: Secondary | ICD-10-CM | POA: Diagnosis not present

## 2016-11-11 DIAGNOSIS — Z9889 Other specified postprocedural states: Secondary | ICD-10-CM | POA: Insufficient documentation

## 2016-11-11 LAB — CREATININE, SERUM
CREATININE: 1.26 mg/dL — AB (ref 0.44–1.00)
GFR calc non Af Amer: 37 mL/min — ABNORMAL LOW (ref >60)
GFR, EST AFRICAN AMERICAN: 42 mL/min — AB (ref >60)

## 2016-11-11 MED ORDER — GADOBENATE DIMEGLUMINE 529 MG/ML IV SOLN
15.0000 mL | Freq: Once | INTRAVENOUS | Status: AC | PRN
  Administered 2016-11-11: 15 mL via INTRAVENOUS

## 2016-11-18 ENCOUNTER — Telehealth (HOSPITAL_COMMUNITY): Payer: Self-pay

## 2016-11-18 NOTE — Telephone Encounter (Signed)
Pt agreed to f/u in 1 yr with mri/mra. AW 

## 2017-01-04 DIAGNOSIS — N183 Chronic kidney disease, stage 3 (moderate): Secondary | ICD-10-CM | POA: Diagnosis not present

## 2017-01-04 DIAGNOSIS — F3340 Major depressive disorder, recurrent, in remission, unspecified: Secondary | ICD-10-CM | POA: Diagnosis not present

## 2017-01-04 DIAGNOSIS — I129 Hypertensive chronic kidney disease with stage 1 through stage 4 chronic kidney disease, or unspecified chronic kidney disease: Secondary | ICD-10-CM | POA: Diagnosis not present

## 2017-01-04 DIAGNOSIS — D510 Vitamin B12 deficiency anemia due to intrinsic factor deficiency: Secondary | ICD-10-CM | POA: Diagnosis not present

## 2017-01-14 DIAGNOSIS — H524 Presbyopia: Secondary | ICD-10-CM | POA: Diagnosis not present

## 2017-01-25 DIAGNOSIS — I129 Hypertensive chronic kidney disease with stage 1 through stage 4 chronic kidney disease, or unspecified chronic kidney disease: Secondary | ICD-10-CM | POA: Diagnosis not present

## 2017-01-25 DIAGNOSIS — Z79899 Other long term (current) drug therapy: Secondary | ICD-10-CM | POA: Diagnosis not present

## 2017-01-25 DIAGNOSIS — R5383 Other fatigue: Secondary | ICD-10-CM | POA: Diagnosis not present

## 2017-01-25 DIAGNOSIS — N183 Chronic kidney disease, stage 3 (moderate): Secondary | ICD-10-CM | POA: Diagnosis not present

## 2017-03-18 DIAGNOSIS — Z23 Encounter for immunization: Secondary | ICD-10-CM | POA: Diagnosis not present

## 2017-08-02 DIAGNOSIS — F3340 Major depressive disorder, recurrent, in remission, unspecified: Secondary | ICD-10-CM | POA: Diagnosis not present

## 2017-08-02 DIAGNOSIS — I129 Hypertensive chronic kidney disease with stage 1 through stage 4 chronic kidney disease, or unspecified chronic kidney disease: Secondary | ICD-10-CM | POA: Diagnosis not present

## 2017-08-02 DIAGNOSIS — N183 Chronic kidney disease, stage 3 (moderate): Secondary | ICD-10-CM | POA: Diagnosis not present

## 2017-08-02 DIAGNOSIS — Z0000 Encounter for general adult medical examination without abnormal findings: Secondary | ICD-10-CM | POA: Diagnosis not present

## 2017-09-28 DIAGNOSIS — N183 Chronic kidney disease, stage 3 (moderate): Secondary | ICD-10-CM | POA: Diagnosis not present

## 2017-09-28 DIAGNOSIS — I129 Hypertensive chronic kidney disease with stage 1 through stage 4 chronic kidney disease, or unspecified chronic kidney disease: Secondary | ICD-10-CM | POA: Diagnosis not present

## 2017-09-28 DIAGNOSIS — M545 Low back pain: Secondary | ICD-10-CM | POA: Diagnosis not present

## 2017-10-06 DIAGNOSIS — D510 Vitamin B12 deficiency anemia due to intrinsic factor deficiency: Secondary | ICD-10-CM | POA: Diagnosis not present

## 2017-10-06 DIAGNOSIS — F3340 Major depressive disorder, recurrent, in remission, unspecified: Secondary | ICD-10-CM | POA: Diagnosis not present

## 2017-10-06 DIAGNOSIS — E782 Mixed hyperlipidemia: Secondary | ICD-10-CM | POA: Diagnosis not present

## 2017-10-06 DIAGNOSIS — N183 Chronic kidney disease, stage 3 (moderate): Secondary | ICD-10-CM | POA: Diagnosis not present

## 2017-10-18 ENCOUNTER — Other Ambulatory Visit (HOSPITAL_COMMUNITY): Payer: Self-pay | Admitting: Interventional Radiology

## 2017-10-18 DIAGNOSIS — I729 Aneurysm of unspecified site: Secondary | ICD-10-CM

## 2017-11-01 DIAGNOSIS — I129 Hypertensive chronic kidney disease with stage 1 through stage 4 chronic kidney disease, or unspecified chronic kidney disease: Secondary | ICD-10-CM | POA: Diagnosis not present

## 2017-11-01 DIAGNOSIS — N183 Chronic kidney disease, stage 3 (moderate): Secondary | ICD-10-CM | POA: Diagnosis not present

## 2017-11-01 DIAGNOSIS — R002 Palpitations: Secondary | ICD-10-CM | POA: Diagnosis not present

## 2017-11-02 ENCOUNTER — Ambulatory Visit (HOSPITAL_COMMUNITY)
Admission: RE | Admit: 2017-11-02 | Discharge: 2017-11-02 | Disposition: A | Discharge status: Home / Self Care | Payer: Medicare Other | Source: Ambulatory Visit | Attending: Interventional Radiology | Admitting: Interventional Radiology

## 2017-11-02 DIAGNOSIS — I671 Cerebral aneurysm, nonruptured: Secondary | ICD-10-CM | POA: Diagnosis not present

## 2017-11-02 DIAGNOSIS — G319 Degenerative disease of nervous system, unspecified: Secondary | ICD-10-CM | POA: Diagnosis not present

## 2017-11-02 DIAGNOSIS — I739 Peripheral vascular disease, unspecified: Secondary | ICD-10-CM | POA: Insufficient documentation

## 2017-11-02 DIAGNOSIS — I729 Aneurysm of unspecified site: Secondary | ICD-10-CM | POA: Diagnosis present

## 2017-11-02 LAB — POCT I-STAT CREATININE: Creatinine, Ser: 1.3 mg/dL — ABNORMAL HIGH (ref 0.44–1.00)

## 2017-11-02 MED ORDER — GADOBENATE DIMEGLUMINE 529 MG/ML IV SOLN: 15.0000 mL | Freq: Once | INTRAVENOUS | Status: DC | PRN

## 2017-11-02 MED ORDER — GADOBENATE DIMEGLUMINE 529 MG/ML IV SOLN
7.0000 mL | Freq: Once | INTRAVENOUS | Status: AC | PRN
  Administered 2017-11-02: 7 mL via INTRAVENOUS

## 2017-11-11 DIAGNOSIS — F3340 Major depressive disorder, recurrent, in remission, unspecified: Secondary | ICD-10-CM | POA: Diagnosis not present

## 2017-11-11 DIAGNOSIS — D510 Vitamin B12 deficiency anemia due to intrinsic factor deficiency: Secondary | ICD-10-CM | POA: Diagnosis not present

## 2017-11-11 DIAGNOSIS — N183 Chronic kidney disease, stage 3 (moderate): Secondary | ICD-10-CM | POA: Diagnosis not present

## 2017-11-11 DIAGNOSIS — E782 Mixed hyperlipidemia: Secondary | ICD-10-CM | POA: Diagnosis not present

## 2017-11-15 DIAGNOSIS — I10 Essential (primary) hypertension: Secondary | ICD-10-CM | POA: Diagnosis not present

## 2017-11-15 DIAGNOSIS — N183 Chronic kidney disease, stage 3 (moderate): Secondary | ICD-10-CM | POA: Diagnosis not present

## 2017-11-15 DIAGNOSIS — E7800 Pure hypercholesterolemia, unspecified: Secondary | ICD-10-CM | POA: Diagnosis not present

## 2017-11-17 ENCOUNTER — Emergency Department (HOSPITAL_COMMUNITY): Payer: Medicare Other

## 2017-11-17 ENCOUNTER — Encounter (HOSPITAL_COMMUNITY): Payer: Self-pay | Admitting: Internal Medicine

## 2017-11-17 ENCOUNTER — Encounter: Payer: Self-pay | Admitting: Internal Medicine

## 2017-11-17 ENCOUNTER — Observation Stay (HOSPITAL_COMMUNITY)
Admission: EM | Admit: 2017-11-17 | Discharge: 2017-11-19 | Disposition: A | Discharge status: Home / Self Care | Payer: Medicare Other | Attending: Family Medicine | Admitting: Family Medicine

## 2017-11-17 ENCOUNTER — Other Ambulatory Visit: Payer: Self-pay

## 2017-11-17 DIAGNOSIS — S0990XA Unspecified injury of head, initial encounter: Secondary | ICD-10-CM | POA: Diagnosis not present

## 2017-11-17 DIAGNOSIS — E785 Hyperlipidemia, unspecified: Secondary | ICD-10-CM | POA: Diagnosis present

## 2017-11-17 DIAGNOSIS — R0902 Hypoxemia: Secondary | ICD-10-CM | POA: Diagnosis not present

## 2017-11-17 DIAGNOSIS — I517 Cardiomegaly: Secondary | ICD-10-CM | POA: Diagnosis not present

## 2017-11-17 DIAGNOSIS — I615 Nontraumatic intracerebral hemorrhage, intraventricular: Secondary | ICD-10-CM

## 2017-11-17 DIAGNOSIS — S066X9A Traumatic subarachnoid hemorrhage with loss of consciousness of unspecified duration, initial encounter: Principal | ICD-10-CM | POA: Insufficient documentation

## 2017-11-17 DIAGNOSIS — Z7902 Long term (current) use of antithrombotics/antiplatelets: Secondary | ICD-10-CM | POA: Insufficient documentation

## 2017-11-17 DIAGNOSIS — R2681 Unsteadiness on feet: Secondary | ICD-10-CM | POA: Diagnosis not present

## 2017-11-17 DIAGNOSIS — S066X1A Traumatic subarachnoid hemorrhage with loss of consciousness of 30 minutes or less, initial encounter: Secondary | ICD-10-CM | POA: Diagnosis not present

## 2017-11-17 DIAGNOSIS — R58 Hemorrhage, not elsewhere classified: Secondary | ICD-10-CM | POA: Diagnosis not present

## 2017-11-17 DIAGNOSIS — W19XXXA Unspecified fall, initial encounter: Secondary | ICD-10-CM | POA: Insufficient documentation

## 2017-11-17 DIAGNOSIS — S299XXA Unspecified injury of thorax, initial encounter: Secondary | ICD-10-CM | POA: Diagnosis not present

## 2017-11-17 DIAGNOSIS — I609 Nontraumatic subarachnoid hemorrhage, unspecified: Secondary | ICD-10-CM | POA: Diagnosis not present

## 2017-11-17 DIAGNOSIS — Z8673 Personal history of transient ischemic attack (TIA), and cerebral infarction without residual deficits: Secondary | ICD-10-CM | POA: Diagnosis not present

## 2017-11-17 DIAGNOSIS — R55 Syncope and collapse: Secondary | ICD-10-CM | POA: Diagnosis present

## 2017-11-17 DIAGNOSIS — S066X0A Traumatic subarachnoid hemorrhage without loss of consciousness, initial encounter: Secondary | ICD-10-CM | POA: Diagnosis not present

## 2017-11-17 DIAGNOSIS — Z888 Allergy status to other drugs, medicaments and biological substances status: Secondary | ICD-10-CM | POA: Diagnosis not present

## 2017-11-17 DIAGNOSIS — I10 Essential (primary) hypertension: Secondary | ICD-10-CM

## 2017-11-17 DIAGNOSIS — S199XXA Unspecified injury of neck, initial encounter: Secondary | ICD-10-CM | POA: Diagnosis not present

## 2017-11-17 DIAGNOSIS — I700 Atherosclerosis of aorta: Secondary | ICD-10-CM | POA: Insufficient documentation

## 2017-11-17 DIAGNOSIS — Y9289 Other specified places as the place of occurrence of the external cause: Secondary | ICD-10-CM | POA: Insufficient documentation

## 2017-11-17 DIAGNOSIS — S06360A Traumatic hemorrhage of cerebrum, unspecified, without loss of consciousness, initial encounter: Secondary | ICD-10-CM | POA: Diagnosis not present

## 2017-11-17 DIAGNOSIS — Z79899 Other long term (current) drug therapy: Secondary | ICD-10-CM | POA: Insufficient documentation

## 2017-11-17 DIAGNOSIS — R402413 Glasgow coma scale score 13-15, at hospital admission: Secondary | ICD-10-CM | POA: Diagnosis not present

## 2017-11-17 DIAGNOSIS — R402441 Other coma, without documented Glasgow coma scale score, or with partial score reported, in the field [EMT or ambulance]: Secondary | ICD-10-CM | POA: Diagnosis not present

## 2017-11-17 DIAGNOSIS — R410 Disorientation, unspecified: Secondary | ICD-10-CM | POA: Diagnosis not present

## 2017-11-17 HISTORY — DX: Essential (primary) hypertension: I10

## 2017-11-17 HISTORY — DX: Syncope and collapse: R55

## 2017-11-17 HISTORY — DX: Unspecified injury of head, initial encounter: S09.90XA

## 2017-11-17 HISTORY — DX: Hyperlipidemia, unspecified: E78.5

## 2017-11-17 HISTORY — DX: Personal history of other diseases of the circulatory system: Z86.79

## 2017-11-17 HISTORY — DX: Nontraumatic subarachnoid hemorrhage, unspecified: I60.9

## 2017-11-17 HISTORY — DX: Cardiomegaly: I51.7

## 2017-11-17 HISTORY — DX: Other specified postprocedural states: Z98.890

## 2017-11-17 LAB — I-STAT CHEM 8, ED
BUN: 19 mg/dL (ref 6–20)
CALCIUM ION: 1.33 mmol/L (ref 1.15–1.40)
CHLORIDE: 106 mmol/L (ref 101–111)
Creatinine, Ser: 1.1 mg/dL — ABNORMAL HIGH (ref 0.44–1.00)
GLUCOSE: 130 mg/dL — AB (ref 65–99)
HCT: 34 % — ABNORMAL LOW (ref 36.0–46.0)
Hemoglobin: 11.6 g/dL — ABNORMAL LOW (ref 12.0–15.0)
Potassium: 3.8 mmol/L (ref 3.5–5.1)
Sodium: 140 mmol/L (ref 135–145)
TCO2: 21 mmol/L — ABNORMAL LOW (ref 22–32)

## 2017-11-17 LAB — CBC WITH DIFFERENTIAL/PLATELET
ABS IMMATURE GRANULOCYTES: 0 10*3/uL (ref 0.0–0.1)
Basophils Absolute: 0 10*3/uL (ref 0.0–0.1)
Basophils Relative: 0 %
Eosinophils Absolute: 0.3 10*3/uL (ref 0.0–0.7)
Eosinophils Relative: 4 %
HCT: 35.5 % — ABNORMAL LOW (ref 36.0–46.0)
HEMOGLOBIN: 12.3 g/dL (ref 12.0–15.0)
Immature Granulocytes: 0 %
LYMPHS PCT: 14 %
Lymphs Abs: 1.2 10*3/uL (ref 0.7–4.0)
MCH: 32.1 pg (ref 26.0–34.0)
MCHC: 34.6 g/dL (ref 30.0–36.0)
MCV: 92.7 fL (ref 78.0–100.0)
MONO ABS: 0.5 10*3/uL (ref 0.1–1.0)
MONOS PCT: 6 %
NEUTROS ABS: 6.4 10*3/uL (ref 1.7–7.7)
Neutrophils Relative %: 76 %
Platelets: 197 10*3/uL (ref 150–400)
RBC: 3.83 MIL/uL — ABNORMAL LOW (ref 3.87–5.11)
RDW: 12.6 % (ref 11.5–15.5)
WBC: 8.5 10*3/uL (ref 4.0–10.5)

## 2017-11-17 LAB — I-STAT TROPONIN, ED: Troponin i, poc: 0.03 ng/mL (ref 0.00–0.08)

## 2017-11-17 LAB — ETHANOL: Alcohol, Ethyl (B): <10 mg/dL (ref <10)

## 2017-11-17 LAB — PROTIME-INR
INR: 0.97
PROTHROMBIN TIME: 12.8 s (ref 11.4–15.2)

## 2017-11-17 LAB — TROPONIN I: Troponin I: <0.03 ng/mL (ref <0.03)

## 2017-11-17 MED ORDER — SODIUM CHLORIDE 0.9 % IV SOLN
INTRAVENOUS | Status: AC
  Administered 2017-11-18: via INTRAVENOUS

## 2017-11-17 MED ORDER — CARVEDILOL 12.5 MG PO TABS: 12.5000 mg | ORAL_TABLET | Freq: Two times a day (BID) | ORAL | Status: DC

## 2017-11-17 MED ORDER — ACETAMINOPHEN 325 MG PO TABS
650.0000 mg | ORAL_TABLET | Freq: Four times a day (QID) | ORAL | Status: DC | PRN
  Administered 2017-11-18 (×2): 650 mg via ORAL
  Filled 2017-11-17 (×2): qty 2

## 2017-11-17 MED ORDER — ONDANSETRON HCL 4 MG PO TABS: 4.0000 mg | ORAL_TABLET | Freq: Four times a day (QID) | ORAL | Status: DC | PRN

## 2017-11-17 MED ORDER — ACETAMINOPHEN 650 MG RE SUPP: 650.0000 mg | Freq: Four times a day (QID) | RECTAL | Status: DC | PRN

## 2017-11-17 MED ORDER — ACETAMINOPHEN 325 MG PO TABS
650.0000 mg | ORAL_TABLET | Freq: Once | ORAL | Status: AC
  Administered 2017-11-17: 650 mg via ORAL
  Filled 2017-11-17: qty 2

## 2017-11-17 MED ORDER — ATORVASTATIN CALCIUM 40 MG PO TABS
40.0000 mg | ORAL_TABLET | Freq: Every evening | ORAL | Status: DC
  Administered 2017-11-18: 40 mg via ORAL
  Filled 2017-11-17: qty 1

## 2017-11-17 MED ORDER — ONDANSETRON HCL 4 MG/2ML IJ SOLN: 4.0000 mg | Freq: Four times a day (QID) | INTRAMUSCULAR | Status: DC | PRN

## 2017-11-17 NOTE — ED Notes (Signed)
Pt began bleeding from the laceration to head while resting in the bed. EDP notified. Bleeding controlled with pressure and redressed with head wrap.
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
## 2017-11-17 NOTE — ED Notes (Signed)
Patient transported to CT 

## 2017-11-17 NOTE — Consult Note (Signed)
Reason for Consult:traumatic sah, ivh Referring Physician: pickering, n  Tabitha Sanders is an 84 y.o. female.  HPI: whom while visiting her late husband's gravesite was found down at the [employer]. She was found at 1400, slightly confused, but appropriate upon arrival to the [facility] ED. Head CT revealed subarachnoid blood, intraventricular blood, scalp hematoma on the left. Exam showed a periorbital hematoma. GCS 15. Has been bradycardic.  No past medical history on file.   No family history on file.  Social History:  has no tobacco, alcohol, and drug history on file.  Allergies: Allergies not on file  Medications: I have reviewed the patient's current medications.  Results for orders placed or performed during the hospital encounter of 11/17/17 (from the past 48 hour(s))  Protime-INR     Status: None   Collection Time: 11/17/17  2:18 PM  Result Value Ref Range   Prothrombin Time 12.8 11.4 - 15.2 seconds   INR 0.97     Comment: Performed at [facility], [place] [address], [place], [place] [address]  CBC with Differential     Status: Abnormal   Collection Time: 11/17/17  2:18 PM  Result Value Ref Range   WBC 8.5 4.0 - 10.5 K/uL   RBC 3.83 (L) 3.87 - 5.11 MIL/uL   Hemoglobin 12.3 12.0 - 15.0 g/dL   HCT 35.5 (L) 36.0 - 46.0 %   MCV 92.7 78.0 - 100.0 fL   MCH 32.1 26.0 - 34.0 pg   MCHC 34.6 30.0 - 36.0 g/dL   RDW 12.6 11.5 - 15.5 %   Platelets 197 150 - 400 K/uL   Neutrophils Relative % 76 %   Neutro Abs 6.4 1.7 - 7.7 K/uL   Lymphocytes Relative 14 %   Lymphs Abs 1.2 0.7 - 4.0 K/uL   Monocytes Relative 6 %   Monocytes Absolute 0.5 0.1 - 1.0 K/uL   Eosinophils Relative 4 %   Eosinophils Absolute 0.3 0.0 - 0.7 K/uL   Basophils Relative 0 %   Basophils Absolute 0.0 0.0 - 0.1 K/uL   Immature Granulocytes 0 %   Abs Immature Granulocytes 0.0 0.0 - 0.1 K/uL    Comment: Performed at [facility] [address] [place], [place] [address]  Ethanol     Status: None    Collection Time: 11/17/17  2:18 PM  Result Value Ref Range   Alcohol, Ethyl (B) <10 <10 mg/dL    Comment: (NOTE) Lowest detectable limit for serum alcohol is 10 mg/dL. For medical purposes only. Performed at [facility], [place] [address] [place], [place] [address]   I-stat troponin, ED     Status: None   Collection Time: 11/17/17  2:37 PM  Result Value Ref Range   Troponin i, poc 0.03 0.00 - 0.08 ng/mL   Comment 3            Comment: Due to the release kinetics of cTnI, a negative result within the first hours of the onset of symptoms does not rule out myocardial infarction with certainty. If myocardial infarction is still suspected, repeat the test at appropriate intervals.   I-stat Chem 8, ED     Status: Abnormal   Collection Time: 11/17/17  2:39 PM  Result Value Ref Range   Sodium 140 135 - 145 mmol/L   Potassium 3.8 3.5 - 5.1 mmol/L   Chloride 106 101 - 111 mmol/L   BUN 19 6 - 20 mg/dL   Creatinine, Ser 1.10 (  H) 0.44 - 1.00 mg/dL   Glucose, Bld 130 (H) 65 - 99 mg/dL   Calcium, Ion 1.33 1.15 - 1.40 mmol/L   TCO2 21 (L) 22 - 32 mmol/L   Hemoglobin 11.6 (L) 12.0 - 15.0 g/dL   HCT 34.0 (L) 36.0 - 46.0 %    Ct Head Wo Contrast  Addendum Date: 11/17/2017   ADDENDUM REPORT: 11/17/2017 17:57 ADDENDUM: Critical Value/emergent results were called by telephone at the time of interpretation on 11/17/2017 at 5:57 pm to Dr. Davonna Belling , who verbally acknowledged these results. Electronically Signed   By: Rolm Baptise M.D.   On: 11/17/2017 17:57   Result Date: 11/17/2017 CLINICAL DATA:  Found on ground. Laceration and swelling left forehead and face. EXAM: CT HEAD WITHOUT CONTRAST CT MAXILLOFACIAL WITHOUT CONTRAST CT CERVICAL SPINE WITHOUT CONTRAST TECHNIQUE: Multidetector CT imaging of the head, cervical spine, and maxillofacial structures were performed using the standard protocol without intravenous contrast. Multiplanar CT image reconstructions of the cervical spine and  maxillofacial structures were also generated. COMPARISON:  None. FINDINGS: CT HEAD FINDINGS Brain: Subarachnoid blood noted overlying the frontal lobes bilaterally. Intraventricular blood noted in the posterior horns of the lateral ventricles bilaterally, right greater than left. No hydrocephalus or midline shift. Vascular: No hyperdense vessel or unexpected calcification. Skull: No acute calvarial abnormality. Other: Large hematoma noted in the left lateral forehead soft tissues extending over the left orbit. Soft tissue gas also present. CT MAXILLOFACIAL FINDINGS Osseous: No fracture or mandibular dislocation. No destructive process. Orbits: Pronounced soft tissue swelling/hematoma overlies the left orbit and left forehead. Globes are intact. No orbital fracture. Sinuses: Clear Soft tissues: Pronounced soft tissue swelling over the left face and orbit. Soft tissue gas in the forehead and lateral pre orbital soft tissues. CT CERVICAL SPINE FINDINGS Alignment: No subluxation. Skull base and vertebrae: No fracture Soft tissues and spinal canal: Prevertebral soft tissues are normal. No epidural or paraspinal hematoma. Disc levels: Diffuse degenerative disc and facet disease throughout the cervical spine. Upper chest: Negative Other: Carotid artery calcifications.  No acute findings. IMPRESSION: Subarachnoid hemorrhage overlies the frontal lobes bilaterally. Intraventricular blood noted in the lateral ventricles bilaterally, right greater than left. Large hematoma in the left forehead soft tissues, overlying the left orbit, and left base. No facial or orbital fracture. Degenerative changes in the cervical spine. No acute bony abnormality. Electronically Signed: By: Rolm Baptise M.D. On: 11/17/2017 17:51   Ct Cervical Spine Wo Contrast  Addendum Date: 11/17/2017   ADDENDUM REPORT: 11/17/2017 17:57 ADDENDUM: Critical Value/emergent results were called by telephone at the time of interpretation on 11/17/2017 at 5:57 pm  to Dr. Davonna Belling , who verbally acknowledged these results. Electronically Signed   By: Rolm Baptise M.D.   On: 11/17/2017 17:57   Result Date: 11/17/2017 CLINICAL DATA:  Found on ground. Laceration and swelling left forehead and face. EXAM: CT HEAD WITHOUT CONTRAST CT MAXILLOFACIAL WITHOUT CONTRAST CT CERVICAL SPINE WITHOUT CONTRAST TECHNIQUE: Multidetector CT imaging of the head, cervical spine, and maxillofacial structures were performed using the standard protocol without intravenous contrast. Multiplanar CT image reconstructions of the cervical spine and maxillofacial structures were also generated. COMPARISON:  None. FINDINGS: CT HEAD FINDINGS Brain: Subarachnoid blood noted overlying the frontal lobes bilaterally. Intraventricular blood noted in the posterior horns of the lateral ventricles bilaterally, right greater than left. No hydrocephalus or midline shift. Vascular: No hyperdense vessel or unexpected calcification. Skull: No acute calvarial abnormality. Other: Large hematoma noted in the left lateral forehead  soft tissues extending over the left orbit. Soft tissue gas also present. CT MAXILLOFACIAL FINDINGS Osseous: No fracture or mandibular dislocation. No destructive process. Orbits: Pronounced soft tissue swelling/hematoma overlies the left orbit and left forehead. Globes are intact. No orbital fracture. Sinuses: Clear Soft tissues: Pronounced soft tissue swelling over the left face and orbit. Soft tissue gas in the forehead and lateral pre orbital soft tissues. CT CERVICAL SPINE FINDINGS Alignment: No subluxation. Skull base and vertebrae: No fracture Soft tissues and spinal canal: Prevertebral soft tissues are normal. No epidural or paraspinal hematoma. Disc levels: Diffuse degenerative disc and facet disease throughout the cervical spine. Upper chest: Negative Other: Carotid artery calcifications.  No acute findings. IMPRESSION: Subarachnoid hemorrhage overlies the frontal lobes  bilaterally. Intraventricular blood noted in the lateral ventricles bilaterally, right greater than left. Large hematoma in the left forehead soft tissues, overlying the left orbit, and left base. No facial or orbital fracture. Degenerative changes in the cervical spine. No acute bony abnormality. Electronically Signed: By: Rolm Baptise M.D. On: 11/17/2017 17:51   Dg Chest Portable 1 View  Result Date: 11/17/2017 CLINICAL DATA:  The patient was found down today after an apparent fall. The patient had head injury and eye swelling. EXAM: PORTABLE CHEST 1 VIEW COMPARISON:  None in PACs FINDINGS: The lungs are well-expanded and clear. The heart is mildly enlarged. The pulmonary vascularity is normal. There is calcification in the wall of the aortic arch. There is no pleural effusion or pneumothorax. The observed bony thorax is unremarkable. IMPRESSION: Probable chronic bronchitic changes. Mild cardiomegaly. No pneumonia nor pulmonary edema. Thoracic aortic atherosclerosis. The observed bony thorax is unremarkable. Electronically Signed   By: David  Martinique M.D.   On: 11/17/2017 14:49   Ct Maxillofacial Wo Contrast  Addendum Date: 11/17/2017   ADDENDUM REPORT: 11/17/2017 17:57 ADDENDUM: Critical Value/emergent results were called by telephone at the time of interpretation on 11/17/2017 at 5:57 pm to Dr. Davonna Belling , who verbally acknowledged these results. Electronically Signed   By: Rolm Baptise M.D.   On: 11/17/2017 17:57   Result Date: 11/17/2017 CLINICAL DATA:  Found on ground. Laceration and swelling left forehead and face. EXAM: CT HEAD WITHOUT CONTRAST CT MAXILLOFACIAL WITHOUT CONTRAST CT CERVICAL SPINE WITHOUT CONTRAST TECHNIQUE: Multidetector CT imaging of the head, cervical spine, and maxillofacial structures were performed using the standard protocol without intravenous contrast. Multiplanar CT image reconstructions of the cervical spine and maxillofacial structures were also generated. COMPARISON:   None. FINDINGS: CT HEAD FINDINGS Brain: Subarachnoid blood noted overlying the frontal lobes bilaterally. Intraventricular blood noted in the posterior horns of the lateral ventricles bilaterally, right greater than left. No hydrocephalus or midline shift. Vascular: No hyperdense vessel or unexpected calcification. Skull: No acute calvarial abnormality. Other: Large hematoma noted in the left lateral forehead soft tissues extending over the left orbit. Soft tissue gas also present. CT MAXILLOFACIAL FINDINGS Osseous: No fracture or mandibular dislocation. No destructive process. Orbits: Pronounced soft tissue swelling/hematoma overlies the left orbit and left forehead. Globes are intact. No orbital fracture. Sinuses: Clear Soft tissues: Pronounced soft tissue swelling over the left face and orbit. Soft tissue gas in the forehead and lateral pre orbital soft tissues. CT CERVICAL SPINE FINDINGS Alignment: No subluxation. Skull base and vertebrae: No fracture Soft tissues and spinal canal: Prevertebral soft tissues are normal. No epidural or paraspinal hematoma. Disc levels: Diffuse degenerative disc and facet disease throughout the cervical spine. Upper chest: Negative Other: Carotid artery calcifications.  No acute findings.  IMPRESSION: Subarachnoid hemorrhage overlies the frontal lobes bilaterally. Intraventricular blood noted in the lateral ventricles bilaterally, right greater than left. Large hematoma in the left forehead soft tissues, overlying the left orbit, and left base. No facial or orbital fracture. Degenerative changes in the cervical spine. No acute bony abnormality. Electronically Signed: By: Rolm Baptise M.D. On: 11/17/2017 17:51    Review of Systems  Gastrointestinal: Negative.   Genitourinary: Negative.   Musculoskeletal: Positive for falls.  Neurological: Positive for loss of consciousness.  Endo/Heme/Allergies: Negative.   Psychiatric/Behavioral: Negative.    Blood pressure (!) 134/58,  pulse 60, temperature 97.9 F (36.6 C), temperature source Oral, resp. rate 13, SpO2 94 %. Physical Exam  Constitutional: She is oriented to person, place, and time. She appears well-developed and well-nourished. No distress.  HENT:  Left periorbital hematoma, abrasion to left forehead, left scalp edema, small laceration left face  Eyes: Pupils are equal, round, and reactive to light. EOM are normal.  Neck: Normal range of motion. Neck supple.  Respiratory: Effort normal and breath sounds normal.  Musculoskeletal: Normal range of motion.  Neurological: She is alert and oriented to person, place, and time. She has normal strength and normal reflexes. She displays normal reflexes. No cranial nerve deficit or sensory deficit. She exhibits normal muscle tone. Coordination normal.  Proprioception normal upper extremities, lower extremities Gait not assessed.  Skin: Skin is warm and dry.  Psychiatric: She has a normal mood and affect. Her behavior is normal. Judgment and thought content normal.    Assessment/Plan: Mrs. Hendriks is a 33 with a closed head injury and traumatic sah, ivh. She has a normal exam at this time. Should receive a repeat ct in the morning, I have put the order in.  No other recommendations for now. Will follow.  Terelle Dobler L 11/17/2017, 6:18 PM
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
## 2017-11-18 ENCOUNTER — Observation Stay (HOSPITAL_COMMUNITY): Payer: Medicare Other

## 2017-11-18 ENCOUNTER — Telehealth (HOSPITAL_COMMUNITY): Payer: Self-pay

## 2017-11-18 ENCOUNTER — Observation Stay (HOSPITAL_BASED_OUTPATIENT_CLINIC_OR_DEPARTMENT_OTHER): Payer: Medicare Other

## 2017-11-18 DIAGNOSIS — S0990XA Unspecified injury of head, initial encounter: Secondary | ICD-10-CM | POA: Diagnosis not present

## 2017-11-18 DIAGNOSIS — S066X9A Traumatic subarachnoid hemorrhage with loss of consciousness of unspecified duration, initial encounter: Secondary | ICD-10-CM | POA: Diagnosis not present

## 2017-11-18 DIAGNOSIS — I351 Nonrheumatic aortic (valve) insufficiency: Secondary | ICD-10-CM | POA: Diagnosis not present

## 2017-11-18 DIAGNOSIS — S066X0A Traumatic subarachnoid hemorrhage without loss of consciousness, initial encounter: Secondary | ICD-10-CM | POA: Diagnosis not present

## 2017-11-18 DIAGNOSIS — I10 Essential (primary) hypertension: Secondary | ICD-10-CM | POA: Diagnosis not present

## 2017-11-18 DIAGNOSIS — R2681 Unsteadiness on feet: Secondary | ICD-10-CM | POA: Diagnosis not present

## 2017-11-18 DIAGNOSIS — E785 Hyperlipidemia, unspecified: Secondary | ICD-10-CM | POA: Diagnosis not present

## 2017-11-18 LAB — COMPREHENSIVE METABOLIC PANEL
ALT: 14 U/L (ref 14–54)
AST: 19 U/L (ref 15–41)
Albumin: 3.4 g/dL — ABNORMAL LOW (ref 3.5–5.0)
Alkaline Phosphatase: 69 U/L (ref 38–126)
Anion gap: 7 (ref 5–15)
BUN: 16 mg/dL (ref 6–20)
CHLORIDE: 109 mmol/L (ref 101–111)
CO2: 25 mmol/L (ref 22–32)
CREATININE: 1.22 mg/dL — AB (ref 0.44–1.00)
Calcium: 9.8 mg/dL (ref 8.9–10.3)
GFR, EST AFRICAN AMERICAN: 44 mL/min — AB (ref >60)
GFR, EST NON AFRICAN AMERICAN: 38 mL/min — AB (ref >60)
Glucose, Bld: 96 mg/dL (ref 65–99)
Potassium: 3.7 mmol/L (ref 3.5–5.1)
SODIUM: 141 mmol/L (ref 135–145)
Total Bilirubin: 1.3 mg/dL — ABNORMAL HIGH (ref 0.3–1.2)
Total Protein: 5.8 g/dL — ABNORMAL LOW (ref 6.5–8.1)

## 2017-11-18 LAB — PHOSPHORUS: PHOSPHORUS: 3 mg/dL (ref 2.5–4.6)

## 2017-11-18 LAB — CBC
HCT: 31.3 % — ABNORMAL LOW (ref 36.0–46.0)
HEMOGLOBIN: 10.8 g/dL — AB (ref 12.0–15.0)
MCH: 32 pg (ref 26.0–34.0)
MCHC: 34.5 g/dL (ref 30.0–36.0)
MCV: 92.6 fL (ref 78.0–100.0)
PLATELETS: 188 10*3/uL (ref 150–400)
RBC: 3.38 MIL/uL — ABNORMAL LOW (ref 3.87–5.11)
RDW: 12.5 % (ref 11.5–15.5)
WBC: 7.7 10*3/uL (ref 4.0–10.5)

## 2017-11-18 LAB — TROPONIN I
TROPONIN I: 0.03 ng/mL — AB (ref <0.03)
Troponin I: 0.03 ng/mL (ref <0.03)

## 2017-11-18 LAB — ECHOCARDIOGRAM COMPLETE
Height: 62.5 in
Weight: 2599.66 oz

## 2017-11-18 LAB — MAGNESIUM: Magnesium: 1.5 mg/dL — ABNORMAL LOW (ref 1.7–2.4)

## 2017-11-18 LAB — TSH: TSH: 0.622 u[IU]/mL (ref 0.350–4.500)

## 2017-11-18 LAB — MRSA PCR SCREENING: MRSA BY PCR: NEGATIVE

## 2017-11-18 MED ORDER — AMLODIPINE BESYLATE 5 MG PO TABS
5.0000 mg | ORAL_TABLET | Freq: Every day | ORAL | Status: DC
  Administered 2017-11-18 – 2017-11-19 (×2): 5 mg via ORAL
  Filled 2017-11-18 (×2): qty 1

## 2017-11-18 MED ORDER — LOSARTAN POTASSIUM 50 MG PO TABS
100.0000 mg | ORAL_TABLET | Freq: Every day | ORAL | Status: DC
  Administered 2017-11-18 – 2017-11-19 (×2): 100 mg via ORAL
  Filled 2017-11-18 (×3): qty 2

## 2017-11-18 MED ORDER — MAGNESIUM SULFATE 2 GM/50ML IV SOLN
2.0000 g | Freq: Once | INTRAVENOUS | Status: AC
  Administered 2017-11-18: 2 g via INTRAVENOUS
  Filled 2017-11-18: qty 50

## 2017-11-18 NOTE — Progress Notes (Signed)
CRITICAL VALUE ALERT  Critical Value:  Troponin 0.03  Date & Time Notied:  11/18/17 02:49  Provider Notified: Dr. Hilbert Bible  Orders Received/Actions taken: Dr Hilbert Bible paged

## 2017-11-18 NOTE — Evaluation (Signed)
Physical Therapy Evaluation Patient Details Name: Tabitha Sanders MRN: [id number] DOB: [birth year] Today's Date: 11/18/2017   History of Present Illness  Tabitha Sanders is a 84 y.o. female Patient was found today at the [facility] while visiting her late husband.  CT showed SAH.  Clinical Impression  Pt admitted with/for SAH.  Pt is close to her baseline, but is a little unsteady on evaluation.  Pt currently limited functionally due to the problems listed below.  (see problems list.)  Pt will benefit from PT to maximize function and safety to be able to get home safely with available assist.     Follow Up Recommendations No PT follow up;Supervision - Intermittent    Equipment Recommendations  None recommended by PT    Recommendations for Other Services       Precautions / Restrictions Precautions Precautions: Fall      Mobility  Bed Mobility Overal bed mobility: Modified Independent                Transfers Overall transfer level: Needs assistance   Transfers: Sit to/from Stand Sit to Stand: Modified independent (Device/Increase time)            Ambulation/Gait Ambulation/Gait assistance: Supervision Ambulation Distance (Feet): 150 Feet Assistive device: Rolling walker (2 wheeled) Gait Pattern/deviations: Step-through pattern Gait velocity: slower Gait velocity interpretation: <1.8 ft/sec, indicate of risk for recurrent falls General Gait Details: steady with RW, no cane available at time of the eval.  Stairs            Wheelchair Mobility    Modified Rankin (Stroke Patients Only)       Balance Overall balance assessment: Mild deficits observed, not formally tested                                           Pertinent Vitals/Pain Pain Assessment: Faces Faces Pain Scale: Hurts a little bit Pain Location: L eye Pain Descriptors / Indicators: Sore Pain Intervention(s): Monitored during session    Home Living  Family/patient expects to be discharged to:: Private residence Living Arrangements: Alone;Other (Comment)(children close by) Available Help at Discharge: Family;Available PRN/intermittently Type of Home: House Home Access: Stairs to enter Entrance Stairs-Rails: None Entrance Stairs-Number of Steps: 1 Home Layout: One level Home Equipment: Cane - single point      Prior Function Level of Independence: Independent with assistive device(s)               Hand Dominance        Extremity/Trunk Assessment        Lower Extremity Assessment Lower Extremity Assessment: Overall WFL for tasks assessed       Communication   Communication: No difficulties  Cognition Arousal/Alertness: Awake/alert Behavior During Therapy: WFL for tasks assessed/performed Overall Cognitive Status: Within Functional Limits for tasks assessed                                        General Comments General comments (skin integrity, edema, etc.): SEE orthostatics in vitals section. VSS    Exercises     Assessment/Plan    PT Assessment Patient needs continued PT services  PT Problem List Decreased balance;Pain       PT Treatment Interventions Gait training;DME instruction;Stair training;Functional mobility training;Therapeutic activities;Patient/family education  PT Goals (Current goals can be found in the Care Plan section)  Acute Rehab PT Goals Patient Stated Goal: independent PT Goal Formulation: With patient Time For Goal Achievement: 11/20/17 Potential to Achieve Goals: Good    Frequency Min 3X/week   Barriers to discharge        Co-evaluation               AM-PAC PT "6 Clicks" Daily Activity  Outcome Measure Difficulty turning over in bed (including adjusting bedclothes, sheets and blankets)?: None Difficulty moving from lying on back to sitting on the side of the bed? : None Difficulty sitting down on and standing up from a chair with arms (e.g.,  wheelchair, bedside commode, etc,.)?: None Help needed moving to and from a bed to chair (including a wheelchair)?: None Help needed walking in hospital room?: A Little Help needed climbing 3-5 steps with a railing? : A Little 6 Click Score: 22    End of Session   Activity Tolerance: Patient tolerated treatment well Patient left: in chair;with call bell/phone within reach;with family/visitor present Nurse Communication: Mobility status PT Visit Diagnosis: Unsteadiness on feet (R26.81)    Time: 1030-1045 PT Time Calculation (min) (ACUTE ONLY): 15 min   Charges:   PT Evaluation $PT Eval Moderate Complexity: 1 Mod     PT G Codes:        Nov 19, 2017  Tabitha Sanders, PT [phone]  (pager)  Tabitha Sanders Tabitha Sanders November 19, 2017, 11:25 AM

## 2017-11-18 NOTE — Progress Notes (Signed)
  Echocardiogram 2D Echocardiogram has been performed.  Pharrell Ledford G Krystalyn Kubota 11/18/2017, 1:50 PM

## 2017-11-18 NOTE — Progress Notes (Signed)
PROGRESS NOTE    Tabitha Sanders  WCB:[id number] DOB: 06-04-1926 DOA: 11/17/2017 PCP: Lajean Manes, MD   Brief Narrative:  Tabitha Sanders is Tabitha Sanders 84 y.o. female with medical history significant of stent assisted coiling of the left cavernous carotid artery aneurysm on 05/20/15.HTN, HLD, Polyarthralgia rheumatica who presented after being found down with facial hematoma and imaging findings with SAH.  Assessment & Plan:   Active Problems:   Subarachnoid bleed (HCC)   Head trauma   Essential hypertension   Syncope and collapse   Hyperlipidemia   Cardiomegaly   Head Trauma  Subarachnoid Hemorrhage:  2/2 fall.  Neurosurgery following.  Repeat CT pending at this time.  Appreciate further recs.  Syncope:  Pt with episode of syncope leading to fall.  Can't tell much about preceding event, just that she got to [place], then doesn't remember much after this.  EKG with inverted T waves in V2-V6 as well as I, aVL that appear more prominent than yesterday's EKG (not sure of significance of this with lack of CP, flat troponins, no wall motion abnormality on echo).  Will repeat EKG tomorrow AM.  Pt denies ever having CP.  Concerning for cardiac etiology with lack of prodrome.  Follow echo, orthostatics, carotid US.  Continue tele.  Hold carvedilol with bradycardia.  Will plan for outpatient cardiac monitoring.      HTN: holding coreg, goal BP <140.  Continue losartan and amlodipine. HLD: lipitor Hypomag: replete and follow  DVT prophylaxis: SCD Code Status: full code Family Communication: called daughter Disposition Plan: pending   Consultants:   neurosurgery  Procedures:  Echo  Study Conclusions  - Left ventricle: The cavity size was normal. Wall thickness was   increased in Raevin Wierenga pattern of mild LVH. Systolic function was normal.   The estimated ejection fraction was in the range of 55% to 60%.   Wall motion was normal; there were no regional wall motion   abnormalities. Left  ventricular diastolic function parameters   were normal. - Aortic valve: There was very mild stenosis. - Left atrium: The atrium was moderately dilated. - Atrial septum: No defect or patent foramen ovale was identified. - Pulmonary arteries: PA peak pressure: 31 mm Hg (S).   Antimicrobials:  Anti-infectives (From admission, onward)   None     Subjective: Pain from head/facial trauma. Denies ever having CP.  Doesn't remember what happened preceding syncope.  Objective: Vitals:   11/18/17 0100 11/18/17 0245 11/18/17 0300 11/18/17 0749  BP:  (!) 116/45 (!) 117/56 (!) 134/49  Pulse: (!) 53 (!) 55 (!) 57 (!) 58  Resp:      Temp:   98.6 F (37 C) 98.8 F (37.1 C)  TempSrc:   Oral Oral  SpO2: 94% 92% 97% 94%  Weight:      Height:        Intake/Output Summary (Last 24 hours) at 11/18/2017 0959 Last data filed at 11/18/2017 0300 Gross per 24 hour  Intake 462.5 ml  Output -  Net 462.5 ml   Filed Weights   11/17/17 2319  Weight: 73.7 kg (162 lb 7.7 oz)    Examination:  General exam: Appears calm and comfortable  HEENT: periorbital swelling and ecchymosis on L eye Respiratory system: Clear to auscultation. Respiratory effort normal. Cardiovascular system: S1 & S2 heard, RRR. No JVD, murmurs, rubs, gallops or clicks. No pedal edema. Gastrointestinal system: Abdomen is nondistended, soft and nontender. No organomegaly or masses felt. Normal bowel sounds heard. Central nervous system:  Alert and oriented. No focal neurological deficits. Extremities: Symmetric 5 x 5 power. Skin: No rashes, lesions or ulcers Psychiatry: Judgement and insight appear normal. Mood & affect appropriate.     Data Reviewed: I have personally reviewed following labs and imaging studies  CBC: Recent Labs  Lab 11/17/17 1418 11/17/17 1439 11/18/17 0559  WBC 8.5  --  7.7  NEUTROABS 6.4  --   --   HGB 12.3 11.6* 10.8*  HCT 35.5* 34.0* 31.3*  MCV 92.7  --  92.6  PLT 197  --  361   Basic  Metabolic Panel: Recent Labs  Lab 11/17/17 1439 11/18/17 0559  NA 140 141  K 3.8 3.7  CL 106 109  CO2  --  25  GLUCOSE 130* 96  BUN 19 16  CREATININE 1.10* 1.22*  CALCIUM  --  9.8  MG  --  1.5*  PHOS  --  3.0   GFR: Estimated Creatinine Clearance: 29.2 mL/min (Charon Smedberg) (by C-G formula based on SCr of 1.22 mg/dL (H)). Liver Function Tests: Recent Labs  Lab 11/18/17 0559  AST 19  ALT 14  ALKPHOS 69  BILITOT 1.3*  PROT 5.8*  ALBUMIN 3.4*   No results for input(s): LIPASE, AMYLASE in the last 168 hours. No results for input(s): AMMONIA in the last 168 hours. Coagulation Profile: Recent Labs  Lab 11/17/17 1418  INR 0.97   Cardiac Enzymes: Recent Labs  Lab 11/17/17 2020 11/18/17 0119 11/18/17 0559  TROPONINI <0.03 0.03* 0.03*   BNP (last 3 results) No results for input(s): PROBNP in the last 8760 hours. HbA1C: No results for input(s): HGBA1C in the last 72 hours. CBG: No results for input(s): GLUCAP in the last 168 hours. Lipid Profile: No results for input(s): CHOL, HDL, LDLCALC, TRIG, CHOLHDL, LDLDIRECT in the last 72 hours. Thyroid Function Tests: Recent Labs    11/18/17 0119  TSH 0.622   Anemia Panel: No results for input(s): VITAMINB12, FOLATE, FERRITIN, TIBC, IRON, RETICCTPCT in the last 72 hours. Sepsis Labs: No results for input(s): PROCALCITON, LATICACIDVEN in the last 168 hours.  Recent Results (from the past 240 hour(s))  MRSA PCR Screening     Status: None   Collection Time: 11/17/17 11:19 PM  Result Value Ref Range Status   MRSA by PCR NEGATIVE NEGATIVE Final    Comment:        The GeneXpert MRSA Assay (FDA approved for NASAL specimens only), is one component of [facility] comprehensive MRSA colonization surveillance program. It is not intended to diagnose MRSA infection nor to guide or monitor treatment for MRSA infections. Performed at [facility], [place] [address], [place], [place] [address]          Radiology Studies: Ct Head Wo  Contrast  Addendum Date: 11/17/2017   ADDENDUM REPORT: 11/17/2017 17:57 ADDENDUM: Critical Value/emergent results were called by telephone at the time of interpretation on 11/17/2017 at 5:57 pm to Dr. Davonna Belling , who verbally acknowledged these results. Electronically Signed   By: Rolm Baptise M.D.   On: 11/17/2017 17:57   Result Date: 11/17/2017 CLINICAL DATA:  Found on ground. Laceration and swelling left forehead and face. EXAM: CT HEAD WITHOUT CONTRAST CT MAXILLOFACIAL WITHOUT CONTRAST CT CERVICAL SPINE WITHOUT CONTRAST TECHNIQUE: Multidetector CT imaging of the head, cervical spine, and maxillofacial structures were performed using the standard protocol without intravenous contrast. Multiplanar CT image reconstructions of the cervical spine and maxillofacial structures were also generated. COMPARISON:  None. FINDINGS: CT HEAD FINDINGS Brain:  Subarachnoid blood noted overlying the frontal lobes bilaterally. Intraventricular blood noted in the posterior horns of the lateral ventricles bilaterally, right greater than left. No hydrocephalus or midline shift. Vascular: No hyperdense vessel or unexpected calcification. Skull: No acute calvarial abnormality. Other: Large hematoma noted in the left lateral forehead soft tissues extending over the left orbit. Soft tissue gas also present. CT MAXILLOFACIAL FINDINGS Osseous: No fracture or mandibular dislocation. No destructive process. Orbits: Pronounced soft tissue swelling/hematoma overlies the left orbit and left forehead. Globes are intact. No orbital fracture. Sinuses: Clear Soft tissues: Pronounced soft tissue swelling over the left face and orbit. Soft tissue gas in the forehead and lateral pre orbital soft tissues. CT CERVICAL SPINE FINDINGS Alignment: No subluxation. Skull base and vertebrae: No fracture Soft tissues and spinal canal: Prevertebral soft tissues are normal. No epidural or paraspinal hematoma. Disc levels: Diffuse degenerative disc and facet  disease throughout the cervical spine. Upper chest: Negative Other: Carotid artery calcifications.  No acute findings. IMPRESSION: Subarachnoid hemorrhage overlies the frontal lobes bilaterally. Intraventricular blood noted in the lateral ventricles bilaterally, right greater than left. Large hematoma in the left forehead soft tissues, overlying the left orbit, and left base. No facial or orbital fracture. Degenerative changes in the cervical spine. No acute bony abnormality. Electronically Signed: By: Rolm Baptise M.D. On: 11/17/2017 17:51   Ct Cervical Spine Wo Contrast  Addendum Date: 11/17/2017   ADDENDUM REPORT: 11/17/2017 17:57 ADDENDUM: Critical Value/emergent results were called by telephone at the time of interpretation on 11/17/2017 at 5:57 pm to Dr. Davonna Belling , who verbally acknowledged these results. Electronically Signed   By: Rolm Baptise M.D.   On: 11/17/2017 17:57   Result Date: 11/17/2017 CLINICAL DATA:  Found on ground. Laceration and swelling left forehead and face. EXAM: CT HEAD WITHOUT CONTRAST CT MAXILLOFACIAL WITHOUT CONTRAST CT CERVICAL SPINE WITHOUT CONTRAST TECHNIQUE: Multidetector CT imaging of the head, cervical spine, and maxillofacial structures were performed using the standard protocol without intravenous contrast. Multiplanar CT image reconstructions of the cervical spine and maxillofacial structures were also generated. COMPARISON:  None. FINDINGS: CT HEAD FINDINGS Brain: Subarachnoid blood noted overlying the frontal lobes bilaterally. Intraventricular blood noted in the posterior horns of the lateral ventricles bilaterally, right greater than left. No hydrocephalus or midline shift. Vascular: No hyperdense vessel or unexpected calcification. Skull: No acute calvarial abnormality. Other: Large hematoma noted in the left lateral forehead soft tissues extending over the left orbit. Soft tissue gas also present. CT MAXILLOFACIAL FINDINGS Osseous: No fracture or mandibular  dislocation. No destructive process. Orbits: Pronounced soft tissue swelling/hematoma overlies the left orbit and left forehead. Globes are intact. No orbital fracture. Sinuses: Clear Soft tissues: Pronounced soft tissue swelling over the left face and orbit. Soft tissue gas in the forehead and lateral pre orbital soft tissues. CT CERVICAL SPINE FINDINGS Alignment: No subluxation. Skull base and vertebrae: No fracture Soft tissues and spinal canal: Prevertebral soft tissues are normal. No epidural or paraspinal hematoma. Disc levels: Diffuse degenerative disc and facet disease throughout the cervical spine. Upper chest: Negative Other: Carotid artery calcifications.  No acute findings. IMPRESSION: Subarachnoid hemorrhage overlies the frontal lobes bilaterally. Intraventricular blood noted in the lateral ventricles bilaterally, right greater than left. Large hematoma in the left forehead soft tissues, overlying the left orbit, and left base. No facial or orbital fracture. Degenerative changes in the cervical spine. No acute bony abnormality. Electronically Signed: By: Rolm Baptise M.D. On: 11/17/2017 17:51   Mr Brain Wo Contrast  Result Date: 11/17/2017 CLINICAL DATA:  Initial evaluation for acute altered mental status. EXAM: MRI HEAD WITHOUT CONTRAST TECHNIQUE: Multiplanar, multiecho pulse sequences of the brain and surrounding structures were obtained without intravenous contrast. COMPARISON:  Prior CT from earlier same day. FINDINGS: Brain: Age-appropriate cerebral atrophy with mild chronic small vessel ischemic disease. Scattered susceptibility artifact overlying the anterior frontal convexities consistent with acute subarachnoid hemorrhage, similar as compared to previous CT. Associated small volume intraventricular hemorrhage with blood seen layering within the occipital horns of both lateral ventricles, right greater than left, likely related to redistribution. No hydrocephalus. No frank cortical contusion.  No extra-axial collection. No evidence for acute infarct. Gray-white matter differentiation maintained. No mass lesion, mass effect, or midline shift. Pituitary gland within normal limits. Vascular: Major intracranial vascular flow voids maintained Skull and upper cervical spine: Craniocervical junction normal. Upper cervical spine within normal limits. Bone marrow signal intensity within normal limits. No appreciable calvarial fracture. Prominent soft tissue contusion at the left forehead and periorbital region extending into the left face. Superimposed soft tissue hematoma measures 9 x 16 mm (series 10, image 14). Sinuses/Orbits: Globes intact and within normal limits. Patient status post lens extraction bilaterally. Intraorbital soft tissues within normal limits. Scattered mucosal thickening throughout the paranasal sinuses. Small left mastoid effusion noted. Inner ear structures normal. Other: Few small T2 hyperintense lesions noted within the left parotid gland, doubtful significance in [place] [facility] patient of this age. IMPRESSION: 1. Traumatic brain injury with bifrontal subarachnoid hemorrhage, stable from prior CT. Small volume intraventricular hemorrhage, likely related to redistribution. No hydrocephalus. No frank cortical contusion or extra-axial collection. 2. No other acute intracranial abnormality. 3. Large soft tissue contusion at the left forehead/periorbital region extending into the left face. Electronically Signed   By: Jeannine Boga M.D.   On: 11/17/2017 21:48   Dg Chest Portable 1 View  Result Date: 11/17/2017 CLINICAL DATA:  The patient was found down today after an apparent fall. The patient had head injury and eye swelling. EXAM: PORTABLE CHEST 1 VIEW COMPARISON:  None in PACs FINDINGS: The lungs are well-expanded and clear. The heart is mildly enlarged. The pulmonary vascularity is normal. There is calcification in the wall of the aortic arch. There is no pleural effusion or pneumothorax. The  observed bony thorax is unremarkable. IMPRESSION: Probable chronic bronchitic changes. Mild cardiomegaly. No pneumonia nor pulmonary edema. Thoracic aortic atherosclerosis. The observed bony thorax is unremarkable. Electronically Signed   By: David  Martinique M.D.   On: 11/17/2017 14:49   Ct Maxillofacial Wo Contrast  Addendum Date: 11/17/2017   ADDENDUM REPORT: 11/17/2017 17:57 ADDENDUM: Critical Value/emergent results were called by telephone at the time of interpretation on 11/17/2017 at 5:57 pm to Dr. Davonna Belling , who verbally acknowledged these results. Electronically Signed   By: Rolm Baptise M.D.   On: 11/17/2017 17:57   Result Date: 11/17/2017 CLINICAL DATA:  Found on ground. Laceration and swelling left forehead and face. EXAM: CT HEAD WITHOUT CONTRAST CT MAXILLOFACIAL WITHOUT CONTRAST CT CERVICAL SPINE WITHOUT CONTRAST TECHNIQUE: Multidetector CT imaging of the head, cervical spine, and maxillofacial structures were performed using the standard protocol without intravenous contrast. Multiplanar CT image reconstructions of the cervical spine and maxillofacial structures were also generated. COMPARISON:  None. FINDINGS: CT HEAD FINDINGS Brain: Subarachnoid blood noted overlying the frontal lobes bilaterally. Intraventricular blood noted in the posterior horns of the lateral ventricles bilaterally, right greater than left. No hydrocephalus or midline shift. Vascular: No hyperdense vessel or unexpected calcification. Skull: No  acute calvarial abnormality. Other: Large hematoma noted in the left lateral forehead soft tissues extending over the left orbit. Soft tissue gas also present. CT MAXILLOFACIAL FINDINGS Osseous: No fracture or mandibular dislocation. No destructive process. Orbits: Pronounced soft tissue swelling/hematoma overlies the left orbit and left forehead. Globes are intact. No orbital fracture. Sinuses: Clear Soft tissues: Pronounced soft tissue swelling over the left face and orbit. Soft  tissue gas in the forehead and lateral pre orbital soft tissues. CT CERVICAL SPINE FINDINGS Alignment: No subluxation. Skull base and vertebrae: No fracture Soft tissues and spinal canal: Prevertebral soft tissues are normal. No epidural or paraspinal hematoma. Disc levels: Diffuse degenerative disc and facet disease throughout the cervical spine. Upper chest: Negative Other: Carotid artery calcifications.  No acute findings. IMPRESSION: Subarachnoid hemorrhage overlies the frontal lobes bilaterally. Intraventricular blood noted in the lateral ventricles bilaterally, right greater than left. Large hematoma in the left forehead soft tissues, overlying the left orbit, and left base. No facial or orbital fracture. Degenerative changes in the cervical spine. No acute bony abnormality. Electronically Signed: By: Rolm Baptise M.D. On: 11/17/2017 17:51        Scheduled Meds: . amLODipine  5 mg Oral Daily  . atorvastatin  40 mg Oral QPM  . losartan  100 mg Oral Daily   Continuous Infusions:   LOS: 0 days    Time spent: over [place], MD [facility] Pager [phone]  If 7PM-7AM, please contact night-coverage [email or web address] Password [email or web address] 11/18/2017, 9:59 AM

## 2017-11-18 NOTE — Evaluation (Signed)
Occupational Therapy Evaluation Patient Details Name: Tabitha Sanders MRN: [id number] DOB: May 13, 1926 Today's Date: 11/18/2017    History of Present Illness Tabitha Sanders is a 84 y.o. female Patient was found today at the [facility] while visiting her late husband   Clinical Impression   PTA pt independent in ADL, IADL and mobility with SPC. Pt is currently supervision for safety - but did not need assist for any aspects of ADL or mobility with RW. No visual deficits at this time (educated Pt and family to monitor closely) Pt and family with no questions or concerns at this time. Education for safety complete. OT to sign off. Thank you for the opportunity to serve this patient.     Follow Up Recommendations  No OT follow up;Supervision - Intermittent    Equipment Recommendations  None recommended by OT    Recommendations for Other Services       Precautions / Restrictions Precautions Precautions: Fall Restrictions Weight Bearing Restrictions: No      Mobility Bed Mobility Overal bed mobility: Modified Independent                Transfers Overall transfer level: Needs assistance   Transfers: Sit to/from Stand Sit to Stand: Modified independent (Device/Increase time)              Balance Overall balance assessment: Mild deficits observed, not formally tested                                         ADL either performed or assessed with clinical judgement   ADL Overall ADL's : Modified independent                                       General ADL Comments: supervision for safety in hospital, but no assist needed. Pt able to don socks, perform toilet transfer and sink level grooming     Vision Baseline Vision/History: Wears glasses Wears Glasses: At all times Patient Visual Report: Other (comment)(no deficits noted at this time, eye swollen shut from fall) Vision Assessment?: No apparent visual deficits(told her to  monitor closely) Additional Comments: Able to navigate environment, no blurriness, no problems - educated Pt to monitor      Perception     Praxis      Pertinent Vitals/Pain Pain Assessment: Faces Faces Pain Scale: Hurts a little bit Pain Location: L eye Pain Descriptors / Indicators: Sore Pain Intervention(s): Monitored during session;Repositioned     Hand Dominance Right   Extremity/Trunk Assessment Upper Extremity Assessment Upper Extremity Assessment: Overall WFL for tasks assessed(Pt complaining of some left wrist pain)   Lower Extremity Assessment Lower Extremity Assessment: Overall WFL for tasks assessed   Cervical / Trunk Assessment Cervical / Trunk Assessment: Other exceptions Cervical / Trunk Exceptions: forward head, rounded shoulders   Communication Communication Communication: No difficulties   Cognition Arousal/Alertness: Awake/alert Behavior During Therapy: WFL for tasks assessed/performed Overall Cognitive Status: Within Functional Limits for tasks assessed                                     General Comments  orthostatics saved in vital flowsheets    Exercises     Shoulder Instructions  Home Living Family/patient expects to be discharged to:: Private residence Living Arrangements: Alone;Other (Comment)(children close by) Available Help at Discharge: Family;Available PRN/intermittently Type of Home: House Home Access: Stairs to enter [employer] of Steps: 1 Entrance Stairs-Rails: None Home Layout: One level     Bathroom Shower/Tub: Occupational psychologist: Handicapped height     Home Equipment: Cane - single point          Prior Functioning/Environment Level of Independence: Independent with assistive device(s)        Comments: uses SPC, drives, grocery shops etc. very vibrant!        OT Problem List:        OT Treatment/Interventions:      OT Goals(Current goals can be found in the  care plan section) Acute Rehab OT Goals Patient Stated Goal: independent OT Goal Formulation: With patient Time For Goal Achievement: 12/02/17 Potential to Achieve Goals: Good  OT Frequency:     Barriers to D/C:            Co-evaluation              AM-PAC PT "6 Clicks" Daily Activity     Outcome Measure Help from another person eating meals?: None Help from another person taking care of personal grooming?: None Help from another person toileting, which includes using toliet, bedpan, or urinal?: None Help from another person bathing (including washing, rinsing, drying)?: A Little Help from another person to put on and taking off regular upper body clothing?: None Help from another person to put on and taking off regular lower body clothing?: None 6 Click Score: 23   End of Session Equipment Utilized During Treatment: Gait belt;Rolling walker Nurse Communication: Mobility status  Activity Tolerance: Patient tolerated treatment well Patient left: in chair;with call bell/phone within reach;with family/visitor present                   Time: 1045-1103 OT Time Calculation (min): 18 min Charges:  OT General Charges $OT Visit: 1 Visit OT Evaluation $OT Eval Moderate Complexity: 1 Mod G-Codes:     Hulda Humphrey OTR/L [place] 11/18/2017, 2:08 PM

## 2017-11-18 NOTE — Progress Notes (Signed)
Responded to [facility] to provide support to patient. Patient resting.  Prayed over patient.  Chaplain will follow as needed.  Jaclynn Major, [place], [facility], Pager [phone]

## 2017-11-18 NOTE — Telephone Encounter (Signed)
Left message for pt to return call regarding recent mri. AW

## 2017-11-19 ENCOUNTER — Observation Stay (HOSPITAL_BASED_OUTPATIENT_CLINIC_OR_DEPARTMENT_OTHER): Payer: Medicare Other

## 2017-11-19 ENCOUNTER — Other Ambulatory Visit: Payer: Self-pay | Admitting: Physician Assistant

## 2017-11-19 DIAGNOSIS — R55 Syncope and collapse: Secondary | ICD-10-CM | POA: Diagnosis not present

## 2017-11-19 DIAGNOSIS — S0990XA Unspecified injury of head, initial encounter: Secondary | ICD-10-CM | POA: Diagnosis not present

## 2017-11-19 LAB — CBC
HEMATOCRIT: 32.1 % — AB (ref 36.0–46.0)
HEMOGLOBIN: 11 g/dL — AB (ref 12.0–15.0)
MCH: 32.1 pg (ref 26.0–34.0)
MCHC: 34.3 g/dL (ref 30.0–36.0)
MCV: 93.6 fL (ref 78.0–100.0)
Platelets: 198 10*3/uL (ref 150–400)
RBC: 3.43 MIL/uL — ABNORMAL LOW (ref 3.87–5.11)
RDW: 12.6 % (ref 11.5–15.5)
WBC: 7.3 10*3/uL (ref 4.0–10.5)

## 2017-11-19 LAB — URINALYSIS, ROUTINE W REFLEX MICROSCOPIC
BILIRUBIN URINE: NEGATIVE
GLUCOSE, UA: NEGATIVE mg/dL
HGB URINE DIPSTICK: NEGATIVE
KETONES UR: NEGATIVE mg/dL
NITRITE: NEGATIVE
PROTEIN: NEGATIVE mg/dL
Specific Gravity, Urine: 1.016 (ref 1.005–1.030)
pH: 5 (ref 5.0–8.0)

## 2017-11-19 LAB — BASIC METABOLIC PANEL
ANION GAP: 6 (ref 5–15)
BUN: 15 mg/dL (ref 6–20)
CALCIUM: 10.1 mg/dL (ref 8.9–10.3)
CHLORIDE: 106 mmol/L (ref 101–111)
CO2: 27 mmol/L (ref 22–32)
Creatinine, Ser: 1.33 mg/dL — ABNORMAL HIGH (ref 0.44–1.00)
GFR calc Af Amer: 39 mL/min — ABNORMAL LOW (ref >60)
GFR calc non Af Amer: 34 mL/min — ABNORMAL LOW (ref >60)
GLUCOSE: 103 mg/dL — AB (ref 65–99)
Potassium: 3.5 mmol/L (ref 3.5–5.1)
Sodium: 139 mmol/L (ref 135–145)

## 2017-11-19 LAB — MAGNESIUM: Magnesium: 1.8 mg/dL (ref 1.7–2.4)

## 2017-11-19 MED ORDER — LACTATED RINGERS IV SOLN
INTRAVENOUS | Status: DC
  Administered 2017-11-19: 12:00:00 via INTRAVENOUS

## 2017-11-19 MED ORDER — AMLODIPINE BESYLATE 5 MG PO TABS: 10.0000 mg | ORAL_TABLET | Freq: Every day | ORAL | 0 refills | Status: DC

## 2017-11-19 MED ORDER — AMLODIPINE BESYLATE 5 MG PO TABS
5.0000 mg | ORAL_TABLET | Freq: Once | ORAL | Status: AC
  Administered 2017-11-19: 5 mg via ORAL
  Filled 2017-11-19: qty 1

## 2017-11-19 NOTE — Progress Notes (Signed)
Physical Therapy Treatment Patient Details Name: Tabitha Sanders MRN: [id number] DOB: 1926-02-06 Today's Date: 11/19/2017    History of Present Illness Tabitha Sanders is a 84 y.o. female Patient was found today at the [facility] while visiting her late husband    PT Comments    Goals met safe to be independent with family checking in.   Follow Up Recommendations  No PT follow up;Supervision - Intermittent     Equipment Recommendations  None recommended by PT    Recommendations for Other Services       Precautions / Restrictions Precautions Precautions: Fall    Mobility  Bed Mobility Overal bed mobility: Modified Independent                Transfers Overall transfer level: Modified independent                  Ambulation/Gait Ambulation/Gait assistance: Supervision Ambulation Distance (Feet): 240 Feet Assistive device: Straight cane Gait Pattern/deviations: Step-through pattern Gait velocity: moderate Gait velocity interpretation: 1.31 - 2.62 ft/sec, indicative of limited community ambulator General Gait Details: steady gait with fair use of cane.  pt tending to carry the cane too many steps for optimal use.   Stairs Stairs: Yes Stairs assistance: Min guard Stair Management: One rail Left;Step to pattern;Forwards Number of Stairs: 5 General stair comments: safe with rail and cane   Wheelchair Mobility    Modified Rankin (Stroke Patients Only)       Balance Overall balance assessment: No apparent balance deficits (not formally assessed)                                          Cognition Arousal/Alertness: Awake/alert Behavior During Therapy: WFL for tasks assessed/performed Overall Cognitive Status: Within Functional Limits for tasks assessed                                        Exercises      General Comments        Pertinent Vitals/Pain Pain Assessment: No/denies pain    Home Living                       Prior Function            PT Goals (current goals can now be found in the care plan section) Acute Rehab PT Goals Patient Stated Goal: independent PT Goal Formulation: With patient Time For Goal Achievement: 11/20/17 Potential to Achieve Goals: Good Progress towards PT goals: Progressing toward goals    Frequency    Min 3X/week      PT Plan Current plan remains appropriate    Co-evaluation              AM-PAC PT "6 Clicks" Daily Activity  Outcome Measure  Difficulty turning over in bed (including adjusting bedclothes, sheets and blankets)?: None Difficulty moving from lying on back to sitting on the side of the bed? : None Difficulty sitting down on and standing up from a chair with arms (e.g., wheelchair, bedside commode, etc,.)?: None Help needed moving to and from a bed to chair (including a wheelchair)?: None Help needed walking in hospital room?: A Little Help needed climbing 3-5 steps with a railing? : A Little 6 Click Score: 22  End of Session   Activity Tolerance: Patient tolerated treatment well Patient left: in chair;with call bell/phone within reach;with family/visitor present Nurse Communication: Mobility status PT Visit Diagnosis: Unsteadiness on feet (R26.81)     Time: 3500-9381 PT Time Calculation (min) (ACUTE ONLY): 15 min  Charges:  $Gait Training: 8-22 mins                    G Codes:       2017/11/23  Donnella Sham, PT [phone]  (pager)   [name] 23-Nov-2017, 8:02 PM

## 2017-11-19 NOTE — Progress Notes (Signed)
Carotid artery duplex has been completed. 1-39% ICA stenosis bilaterally.  11/19/17 11:12 AM Tabitha Sanders RVT

## 2017-11-19 NOTE — Care Management Note (Signed)
Case Management Note  Patient Details  Name: Tabitha Sanders MRN: [id number] Date of Birth: 1926-06-08  Subjective/Objective:   Tabitha Sanders is a 84 y.o. female Patient was found today at the [facility] while visiting her late husband.  CT showed SAH.  PTA, pt independent; lives alone.                   Action/Plan: PT/OT recommending no OP follow up.  Family able to provide intermittent assistance at dc.  No dc needs identified.   Expected Discharge Date:  11/19/17               Expected Discharge Plan:  Home/Self Care  In-House Referral:     Discharge planning Services  CM Consult  Post Acute Care Choice:    Choice offered to:     DME Arranged:    DME Agency:     HH Arranged:    HH Agency:     Status of Service:  Completed, signed off  If discussed at [employer] of Stay Meetings, dates discussed:    Additional Comments:  Ella Bodo, RN 11/19/2017, 3:24 PM

## 2017-11-19 NOTE — Discharge Summary (Signed)
Physician Discharge Summary  SHALAWN WYNDER NUU:[id number] DOB: 1925-07-19 DOA: 11/17/2017  PCP: Lajean Manes, MD  Admit date: 11/17/2017 Discharge date: 11/19/2017  Time spent: 40 minutes  Recommendations for Outpatient Follow-up:  1. Follow up with neurosurgery as outpatient 2. Follow up outpatient CBC/CMP 3. Ensure outpatient follow up for cardiac event monitor with cardiology.   4. Attention to creatinine in follow up, had uptrended some here.  May need to adjust or change BP meds if continuing to rise.   Discharge Diagnoses:  Active Problems:   Subarachnoid bleed (HCC)   Head trauma   Essential hypertension   Syncope and collapse   Hyperlipidemia   Cardiomegaly   Discharge Condition: stable  Diet recommendation: heart healthy  Filed Weights   11/17/17 2319  Weight: 73.7 kg (162 lb 7.7 oz)    History of present illness:  Tabitha Sanders 84 y.o.femalewith medical history significant of stent assisted coiling of the left cavernous carotid artery aneurysm on 05/20/15.HTN, HLD, Polyarthralgia rheumatica who presented after being found down with facial hematoma and imaging findings with SAH.  She had imaging notable for subarachnoid and intraventricular hemorrhage.  This was stable on repeat exam on 6/6 AM.  Syncope workup unrevealing inpatient.  She'll be discharged with plans to obtain cardiac monitor as outpatient.  She was stable at the time of discharge and discharged on 6/7.   See below for additional details.   Hospital Course:  Head Trauma  Subarachnoid Hemorrhage  Intraventricular Hemorrhage:  2/2 fall.  Neurosurgery following.  Repeat CT stable.  Discussed with Dr. Christella Noa prior to discharge.  Will follow up with neurosurgery as outpatient.  Discussed avoiding NSAIDs and aspirin.  Only tylenol for pain.     Syncope:  Pt with episode of syncope leading to fall.  Can't tell much about preceding event, just that she got to [place], then doesn't remember  much after this.  EKG with inverted T waves in V2-V6 as well as I, aVL that appear more prominent than on initial EKG (not sure of significance of this with lack of CP, flat troponins, no wall motion abnormality on echo - I discussed findings with cardiology who doubted relationship with syncope, possibly related to subarachnoid hemorrhage and intraventricular hemorrhage?).  Pt denies ever having CP or headache prior to fall.  Concerning for cardiac etiology with lack of prodrome.  Follow echo (EF 55-60%, no RWMA, mild AS), orthostatics (negative on day of discharge), carotid US (with 1-39% stenosis bilaterally and antegrade flow to bilateral vertebral arteries).  Continue tele (notable for bradycardia).  Hold carvedilol with bradycardia.  Will plan for outpatient cardiac monitoring.  Discussed whether SAH could have caused syncope with neurosurgery, but neurosurgery doubts this is case based on imaging (more likely traumatic SAH).     Elevated Creatinine: follow up as outpatient.  UA without protein or blood.  Continue HCTZ/losartan for now as would like to keep BP under good control.  Will need outpatient follow up.   HTN: holding coreg, goal BP <140.  Continue HCTZ/losartan and amlodipine (increased to 10 mg as we stopped carvedilol). HLD: lipitor Hypomag: replete and follow  Procedures: Echo Study Conclusions  - Left ventricle: The cavity size was normal. Wall thickness was   increased in Anddy Wingert pattern of mild LVH. Systolic function was normal.   The estimated ejection fraction was in the range of 55% to 60%.   Wall motion was normal; there were no regional wall motion   abnormalities. Left ventricular diastolic  function parameters   were normal. - Aortic valve: There was very mild stenosis. - Left atrium: The atrium was moderately dilated. - Atrial septum: No defect or patent foramen ovale was identified. - Pulmonary arteries: PA peak pressure: 31 mm Hg  (S).  Consultations:  neurosurgery  Discharge Exam: Vitals:   11/19/17 1119 11/19/17 1611  BP: (!) 145/54 (!) 146/63  Pulse: (!) 58 63  Resp:    Temp: (!) 97.3 F (36.3 C) 97.9 F (36.6 C)  SpO2: 97% 100%   Feels better.    General: No acute distress. HEENT: periorbital edema and echymoses improving Cardiovascular: Heart sounds show Shabria Egley regular rate, and rhythm. No gallops or rubs. No murmurs. No JVD. Lungs: Clear to auscultation bilaterally with good air movement. No rales, rhonchi or wheezes. Abdomen: Soft, nontender, nondistended with normal active bowel sounds. No masses. No hepatosplenomegaly. Neurological: Alert and oriented 3. Moves all extremities 4 with equal strength. Cranial nerves II through XII grossly intact. Skin: Warm and dry. No rashes or lesions. Extremities: No clubbing or cyanosis. No edema. Pedal pulses 2+. Psychiatric: Mood and affect are normal. Insight and judgment are appro.  Discharge Instructions   Discharge Instructions    Call MD for:  difficulty breathing, headache or visual disturbances   Complete by:  As directed    Call MD for:  extreme fatigue   Complete by:  As directed    Call MD for:  persistant dizziness or light-headedness   Complete by:  As directed    Call MD for:  persistant nausea and vomiting   Complete by:  As directed    Call MD for:  redness, tenderness, or signs of infection (pain, swelling, redness, odor or green/yellow discharge around incision site)   Complete by:  As directed    Call MD for:  severe uncontrolled pain   Complete by:  As directed    Call MD for:  temperature >100.4   Complete by:  As directed    Diet - low sodium heart healthy   Complete by:  As directed    Discharge instructions   Complete by:  As directed    You were seen for Tabitha Sanders fall with traumatic brain injury with Dillyn Joaquin subarachnoid hemorrhage.  You were stable after observation and your repeat imaging was stable.  It's not clear what caused your  fainting spell, but the lack of any symptoms before the event makes me concerned for Saara Kijowski cardiac cause.  I will have you follow up with cardiology for Shandelle Borrelli cardiac event monitor to watch for any abnormal rhythms (you should get Michail Boyte call about this within the next few days, if you don't hear from them, please call).  We stopped your carvedilol because your heart rate was Lillian Tigges little bit slow.  Your creatinine (kidney function) had increased Nakiyah Beverley little before we discharged you.  I'm going to continue your losartan and hydrochlorothiazide for now (because I'd like to continue to keep your blood pressure under good control), but you should have Datron Brakebill follow up appointment early next week to recheck your kidney function (please call for an appointment with your PCP after you're discharged).  Since we stopped your carvedilol, I increased the dose of your amlodipine to 10 mg.  Follow up with neurosurgery as scheduled.  Return for new, recurrent, or worsening symptoms.  Please ask your PCP to request records from this hospitalization so they know what was done and what the next steps will be.   Increase activity slowly  Complete by:  As directed      Allergies as of 11/19/2017      Reactions   Lisinopril Cough      Medication List    STOP taking these medications   carvedilol 12.5 MG tablet Commonly known as:  COREG     TAKE these medications   amLODipine 5 MG tablet Commonly known as:  NORVASC Take 2 tablets (10 mg total) by mouth daily. What changed:  how much to take   atorvastatin 40 MG tablet Commonly known as:  LIPITOR Take 40 mg by mouth every evening.   citalopram 20 MG tablet Commonly known as:  CELEXA Take 20 mg by mouth daily.   ferrous sulfate 325 (65 FE) MG tablet Take 325 mg by mouth 3 (three) times Jacyln Carmer week. Take on Monday Wednesday Friday   losartan-hydrochlorothiazide 100-25 MG tablet Commonly known as:  HYZAAR Take 1 tablet by mouth daily.   Melatonin 5 MG Tabs Take 5 mg by  mouth at bedtime as needed (for sleep).   multivitamin-lutein Caps capsule Take 1 capsule by mouth 2 (two) times daily.   VITAMIN B-12 PO Take 1 tablet by mouth 2 (two) times Kamden Reber week. Take on Mondays and Wednesdays   Vitamin D (Ergocalciferol) 50000 units Caps capsule Commonly known as:  DRISDOL Take 50,000 Units by mouth once Adison Jerger week. Take on Fridays      Allergies  Allergen Reactions  . Lisinopril Cough   Follow-up Information    Ashok Pall, MD Follow up in 3 week(s).   Specialty:  Neurosurgery Why:  please call the office to make an appointment Contact information: [address] [place] [address] [phone]        Lajean Manes, MD Follow up.   Specialty:  Internal Medicine Why:  Call for an appointment early next week Contact information: [address] [address] [phone]        [facility] Follow up.   Why:  If you don't receive Merick Kelleher call regarding the cardiac monitor within 1 week, please call.  Contact information: [address] [place] [phone]           The results of significant diagnostics from this hospitalization (including imaging, microbiology, ancillary and laboratory) are listed below for reference.    Significant Diagnostic Studies: Ct Head Wo Contrast  Result Date: 11/18/2017 CLINICAL DATA:  Follow-up subarachnoid hemorrhage following recent fall EXAM: CT HEAD WITHOUT CONTRAST TECHNIQUE: Contiguous axial images were obtained from the base of the skull through the vertex without intravenous contrast. COMPARISON:  11/17/2017 FINDINGS: Brain: Previously seen bifrontal subarachnoid hemorrhage is again identified and stable. Hemorrhage is again seen in the occipital horn of the right lateral ventricle. The tiny amount seen in the left occipital horn is stable as well. No new focal area of hemorrhage is seen. No findings to suggest acute  infarct are noted. No mass lesion is seen. Vascular: No hyperdense vessel or unexpected calcification. Skull: Normal. Negative for fracture or focal lesion. Sinuses/Orbits: No acute finding. Other: Considerable soft tissue swelling is noted over the left eye. Some subcutaneous air is again identified and stable. The previously seen focal hematoma has resolved somewhat in the interval from the prior exam. IMPRESSION: Stable subarachnoid and intraventricular hemorrhage when compared with the prior exam. Soft tissue swelling is again noted in the left periorbital region with slight decrease in the size of the focal hematoma seen previously.  Electronically Signed   By: Inez Catalina M.D.   On: 11/18/2017 10:21   Ct Head Wo Contrast  Addendum Date: 11/17/2017   ADDENDUM REPORT: 11/17/2017 17:57 ADDENDUM: Critical Value/emergent results were called by telephone at the time of interpretation on 11/17/2017 at 5:57 pm to Dr. Davonna Belling , who verbally acknowledged these results. Electronically Signed   By: Rolm Baptise M.D.   On: 11/17/2017 17:57   Result Date: 11/17/2017 CLINICAL DATA:  Found on ground. Laceration and swelling left forehead and face. EXAM: CT HEAD WITHOUT CONTRAST CT MAXILLOFACIAL WITHOUT CONTRAST CT CERVICAL SPINE WITHOUT CONTRAST TECHNIQUE: Multidetector CT imaging of the head, cervical spine, and maxillofacial structures were performed using the standard protocol without intravenous contrast. Multiplanar CT image reconstructions of the cervical spine and maxillofacial structures were also generated. COMPARISON:  None. FINDINGS: CT HEAD FINDINGS Brain: Subarachnoid blood noted overlying the frontal lobes bilaterally. Intraventricular blood noted in the posterior horns of the lateral ventricles bilaterally, right greater than left. No hydrocephalus or midline shift. Vascular: No hyperdense vessel or unexpected calcification. Skull: No acute calvarial abnormality. Other: Large hematoma noted in the  left lateral forehead soft tissues extending over the left orbit. Soft tissue gas also present. CT MAXILLOFACIAL FINDINGS Osseous: No fracture or mandibular dislocation. No destructive process. Orbits: Pronounced soft tissue swelling/hematoma overlies the left orbit and left forehead. Globes are intact. No orbital fracture. Sinuses: Clear Soft tissues: Pronounced soft tissue swelling over the left face and orbit. Soft tissue gas in the forehead and lateral pre orbital soft tissues. CT CERVICAL SPINE FINDINGS Alignment: No subluxation. Skull base and vertebrae: No fracture Soft tissues and spinal canal: Prevertebral soft tissues are normal. No epidural or paraspinal hematoma. Disc levels: Diffuse degenerative disc and facet disease throughout the cervical spine. Upper chest: Negative Other: Carotid artery calcifications.  No acute findings. IMPRESSION: Subarachnoid hemorrhage overlies the frontal lobes bilaterally. Intraventricular blood noted in the lateral ventricles bilaterally, right greater than left. Large hematoma in the left forehead soft tissues, overlying the left orbit, and left base. No facial or orbital fracture. Degenerative changes in the cervical spine. No acute bony abnormality. Electronically Signed: By: Rolm Baptise M.D. On: 11/17/2017 17:51   Ct Cervical Spine Wo Contrast  Addendum Date: 11/17/2017   ADDENDUM REPORT: 11/17/2017 17:57 ADDENDUM: Critical Value/emergent results were called by telephone at the time of interpretation on 11/17/2017 at 5:57 pm to Dr. Davonna Belling , who verbally acknowledged these results. Electronically Signed   By: Rolm Baptise M.D.   On: 11/17/2017 17:57   Result Date: 11/17/2017 CLINICAL DATA:  Found on ground. Laceration and swelling left forehead and face. EXAM: CT HEAD WITHOUT CONTRAST CT MAXILLOFACIAL WITHOUT CONTRAST CT CERVICAL SPINE WITHOUT CONTRAST TECHNIQUE: Multidetector CT imaging of the head, cervical spine, and maxillofacial structures were  performed using the standard protocol without intravenous contrast. Multiplanar CT image reconstructions of the cervical spine and maxillofacial structures were also generated. COMPARISON:  None. FINDINGS: CT HEAD FINDINGS Brain: Subarachnoid blood noted overlying the frontal lobes bilaterally. Intraventricular blood noted in the posterior horns of the lateral ventricles bilaterally, right greater than left. No hydrocephalus or midline shift. Vascular: No hyperdense vessel or unexpected calcification. Skull: No acute calvarial abnormality. Other: Large hematoma noted in the left lateral forehead soft tissues extending over the left orbit. Soft tissue gas also present. CT MAXILLOFACIAL FINDINGS Osseous: No fracture or mandibular dislocation. No destructive process. Orbits: Pronounced soft tissue swelling/hematoma overlies the left orbit and left forehead. Globes are  intact. No orbital fracture. Sinuses: Clear Soft tissues: Pronounced soft tissue swelling over the left face and orbit. Soft tissue gas in the forehead and lateral pre orbital soft tissues. CT CERVICAL SPINE FINDINGS Alignment: No subluxation. Skull base and vertebrae: No fracture Soft tissues and spinal canal: Prevertebral soft tissues are normal. No epidural or paraspinal hematoma. Disc levels: Diffuse degenerative disc and facet disease throughout the cervical spine. Upper chest: Negative Other: Carotid artery calcifications.  No acute findings. IMPRESSION: Subarachnoid hemorrhage overlies the frontal lobes bilaterally. Intraventricular blood noted in the lateral ventricles bilaterally, right greater than left. Large hematoma in the left forehead soft tissues, overlying the left orbit, and left base. No facial or orbital fracture. Degenerative changes in the cervical spine. No acute bony abnormality. Electronically Signed: By: Rolm Baptise M.D. On: 11/17/2017 17:51   Mr Brain Wo Contrast  Result Date: 11/17/2017 CLINICAL DATA:  Initial evaluation  for acute altered mental status. EXAM: MRI HEAD WITHOUT CONTRAST TECHNIQUE: Multiplanar, multiecho pulse sequences of the brain and surrounding structures were obtained without intravenous contrast. COMPARISON:  Prior CT from earlier same day. FINDINGS: Brain: Age-appropriate cerebral atrophy with mild chronic small vessel ischemic disease. Scattered susceptibility artifact overlying the anterior frontal convexities consistent with acute subarachnoid hemorrhage, similar as compared to previous CT. Associated small volume intraventricular hemorrhage with blood seen layering within the occipital horns of both lateral ventricles, right greater than left, likely related to redistribution. No hydrocephalus. No frank cortical contusion. No extra-axial collection. No evidence for acute infarct. Gray-white matter differentiation maintained. No mass lesion, mass effect, or midline shift. Pituitary gland within normal limits. Vascular: Major intracranial vascular flow voids maintained Skull and upper cervical spine: Craniocervical junction normal. Upper cervical spine within normal limits. Bone marrow signal intensity within normal limits. No appreciable calvarial fracture. Prominent soft tissue contusion at the left forehead and periorbital region extending into the left face. Superimposed soft tissue hematoma measures 9 x 16 mm (series 10, image 14). Sinuses/Orbits: Globes intact and within normal limits. Patient status post lens extraction bilaterally. Intraorbital soft tissues within normal limits. Scattered mucosal thickening throughout the paranasal sinuses. Small left mastoid effusion noted. Inner ear structures normal. Other: Few small T2 hyperintense lesions noted within the left parotid gland, doubtful significance in Jettson Crable patient of this age. IMPRESSION: 1. Traumatic brain injury with bifrontal subarachnoid hemorrhage, stable from prior CT. Small volume intraventricular hemorrhage, likely related to redistribution. No  hydrocephalus. No frank cortical contusion or extra-axial collection. 2. No other acute intracranial abnormality. 3. Large soft tissue contusion at the left forehead/periorbital region extending into the left face. Electronically Signed   By: Jeannine Boga M.D.   On: 11/17/2017 21:48   Dg Chest Portable 1 View  Result Date: 11/17/2017 CLINICAL DATA:  The patient was found down today after an apparent fall. The patient had head injury and eye swelling. EXAM: PORTABLE CHEST 1 VIEW COMPARISON:  None in PACs FINDINGS: The lungs are well-expanded and clear. The heart is mildly enlarged. The pulmonary vascularity is normal. There is calcification in the wall of the aortic arch. There is no pleural effusion or pneumothorax. The observed bony thorax is unremarkable. IMPRESSION: Probable chronic bronchitic changes. Mild cardiomegaly. No pneumonia nor pulmonary edema. Thoracic aortic atherosclerosis. The observed bony thorax is unremarkable. Electronically Signed   By: David  Martinique M.D.   On: 11/17/2017 14:49   Ct Maxillofacial Wo Contrast  Addendum Date: 11/17/2017   ADDENDUM REPORT: 11/17/2017 17:57 ADDENDUM: Critical Value/emergent results were called by  telephone at the time of interpretation on 11/17/2017 at 5:57 pm to Dr. Davonna Belling , who verbally acknowledged these results. Electronically Signed   By: Rolm Baptise M.D.   On: 11/17/2017 17:57   Result Date: 11/17/2017 CLINICAL DATA:  Found on ground. Laceration and swelling left forehead and face. EXAM: CT HEAD WITHOUT CONTRAST CT MAXILLOFACIAL WITHOUT CONTRAST CT CERVICAL SPINE WITHOUT CONTRAST TECHNIQUE: Multidetector CT imaging of the head, cervical spine, and maxillofacial structures were performed using the standard protocol without intravenous contrast. Multiplanar CT image reconstructions of the cervical spine and maxillofacial structures were also generated. COMPARISON:  None. FINDINGS: CT HEAD FINDINGS Brain: Subarachnoid blood noted  overlying the frontal lobes bilaterally. Intraventricular blood noted in the posterior horns of the lateral ventricles bilaterally, right greater than left. No hydrocephalus or midline shift. Vascular: No hyperdense vessel or unexpected calcification. Skull: No acute calvarial abnormality. Other: Large hematoma noted in the left lateral forehead soft tissues extending over the left orbit. Soft tissue gas also present. CT MAXILLOFACIAL FINDINGS Osseous: No fracture or mandibular dislocation. No destructive process. Orbits: Pronounced soft tissue swelling/hematoma overlies the left orbit and left forehead. Globes are intact. No orbital fracture. Sinuses: Clear Soft tissues: Pronounced soft tissue swelling over the left face and orbit. Soft tissue gas in the forehead and lateral pre orbital soft tissues. CT CERVICAL SPINE FINDINGS Alignment: No subluxation. Skull base and vertebrae: No fracture Soft tissues and spinal canal: Prevertebral soft tissues are normal. No epidural or paraspinal hematoma. Disc levels: Diffuse degenerative disc and facet disease throughout the cervical spine. Upper chest: Negative Other: Carotid artery calcifications.  No acute findings. IMPRESSION: Subarachnoid hemorrhage overlies the frontal lobes bilaterally. Intraventricular blood noted in the lateral ventricles bilaterally, right greater than left. Large hematoma in the left forehead soft tissues, overlying the left orbit, and left base. No facial or orbital fracture. Degenerative changes in the cervical spine. No acute bony abnormality. Electronically Signed: By: Rolm Baptise M.D. On: 11/17/2017 17:51    Microbiology: Recent Results (from the past 240 hour(s))  MRSA PCR Screening     Status: None   Collection Time: 11/17/17 11:19 PM  Result Value Ref Range Status   MRSA by PCR NEGATIVE NEGATIVE Final    Comment:        The GeneXpert MRSA Assay (FDA approved for NASAL specimens only), is one component of Nazier Neyhart comprehensive MRSA  colonization surveillance program. It is not intended to diagnose MRSA infection nor to guide or monitor treatment for MRSA infections. Performed at [facility], [place] [address], [place], [place] [address]      Labs: Basic Metabolic Panel: Recent Labs  Lab 11/17/17 1439 11/18/17 0559 11/19/17 0338  NA 140 141 139  K 3.8 3.7 3.5  CL 106 109 106  CO2  --  25 27  GLUCOSE 130* 96 103*  BUN 19 16 15   CREATININE 1.10* 1.22* 1.33*  CALCIUM  --  9.8 10.1  MG  --  1.5* 1.8  PHOS  --  3.0  --    Liver Function Tests: Recent Labs  Lab 11/18/17 0559  AST 19  ALT 14  ALKPHOS 69  BILITOT 1.3*  PROT 5.8*  ALBUMIN 3.4*   No results for input(s): LIPASE, AMYLASE in the last 168 hours. No results for input(s): AMMONIA in the last 168 hours. CBC: Recent Labs  Lab 11/17/17 1418 11/17/17 1439 11/18/17 0559 11/19/17 0338  WBC 8.5  --  7.7 7.3  NEUTROABS 6.4  --   --   --  HGB 12.3 11.6* 10.8* 11.0*  HCT 35.5* 34.0* 31.3* 32.1*  MCV 92.7  --  92.6 93.6  PLT 197  --  188 198   Cardiac Enzymes: Recent Labs  Lab 11/17/17 2020 11/18/17 0119 11/18/17 0559  TROPONINI <0.03 0.03* 0.03*   BNP: BNP (last 3 results) No results for input(s): BNP in the last 8760 hours.  ProBNP (last 3 results) No results for input(s): PROBNP in the last 8760 hours.  CBG: No results for input(s): GLUCAP in the last 168 hours.     Signed:  Fayrene Helper MD.  [facility] 11/19/2017, 6:42 PM

## 2017-11-22 ENCOUNTER — Other Ambulatory Visit (HOSPITAL_COMMUNITY): Payer: Self-pay | Admitting: Interventional Radiology

## 2017-11-22 ENCOUNTER — Encounter: Payer: Self-pay | Admitting: Internal Medicine

## 2017-11-22 DIAGNOSIS — I729 Aneurysm of unspecified site: Secondary | ICD-10-CM

## 2017-11-23 DIAGNOSIS — I129 Hypertensive chronic kidney disease with stage 1 through stage 4 chronic kidney disease, or unspecified chronic kidney disease: Secondary | ICD-10-CM | POA: Diagnosis not present

## 2017-11-23 DIAGNOSIS — I609 Nontraumatic subarachnoid hemorrhage, unspecified: Secondary | ICD-10-CM | POA: Diagnosis not present

## 2017-11-23 DIAGNOSIS — Z79899 Other long term (current) drug therapy: Secondary | ICD-10-CM | POA: Diagnosis not present

## 2017-11-23 DIAGNOSIS — N183 Chronic kidney disease, stage 3 (moderate): Secondary | ICD-10-CM | POA: Diagnosis not present

## 2017-11-26 ENCOUNTER — Ambulatory Visit (INDEPENDENT_AMBULATORY_CARE_PROVIDER_SITE_OTHER): Payer: Medicare Other

## 2017-11-26 DIAGNOSIS — R55 Syncope and collapse: Secondary | ICD-10-CM | POA: Diagnosis not present

## 2017-11-27 DIAGNOSIS — R55 Syncope and collapse: Secondary | ICD-10-CM | POA: Diagnosis not present

## 2017-12-01 ENCOUNTER — Ambulatory Visit (HOSPITAL_COMMUNITY): Admission: RE | Admit: 2017-12-01 | Payer: Medicare Other | Source: Ambulatory Visit

## 2017-12-08 ENCOUNTER — Telehealth: Payer: Self-pay | Admitting: Internal Medicine

## 2017-12-08 NOTE — Telephone Encounter (Signed)
Recently placed heart monitor reports aifb; rate 130s-- patient called but no answer for symptom check will forward to ordering team

## 2017-12-09 ENCOUNTER — Telehealth: Payer: Self-pay

## 2017-12-09 NOTE — Telephone Encounter (Signed)
I received a monitor report on 6/26 pertaining to this patient indicating atrial fibrillation (HR 140) episode at 12:39 AM EST on 6/26.  The patient's symptoms are syncope and collapse.  I spoke to her on 6/26 and she said that she was awakened by the episode and phone call.  She was not able to answer the phone call.  She also mentioned being a little SOB.   I showed the monitor printout to DOD, Dr Rayann Heman, and his comment was "continue to monitor."  He also said that an appointment could be made at [facility] since Rosaria Ferries had ordered the monitor and the patient has no established cardiologist.  Jari Sportsman is trying to find an available appointment.

## 2017-12-09 NOTE — Telephone Encounter (Signed)
She needs to be seen by any provider to discuss the risks and benefits of anticoagulation.  I have never seen her before. Will forward a note to Dr. Felipa Eth.

## 2017-12-09 NOTE — Telephone Encounter (Signed)
Legrand Como, please follow-up on this patient as you reviewed monitor report with the DOD the other day. Thanks

## 2017-12-13 ENCOUNTER — Ambulatory Visit: Payer: Medicare Other | Admitting: Physician Assistant

## 2017-12-13 ENCOUNTER — Encounter: Payer: Self-pay | Admitting: Physician Assistant

## 2017-12-13 VITALS — BP 124/46 | HR 46 | Ht 63.0 in | Wt 159.0 lb

## 2017-12-13 DIAGNOSIS — I6359 Cerebral infarction due to unspecified occlusion or stenosis of other cerebral artery: Secondary | ICD-10-CM

## 2017-12-13 DIAGNOSIS — I495 Sick sinus syndrome: Secondary | ICD-10-CM

## 2017-12-13 DIAGNOSIS — I671 Cerebral aneurysm, nonruptured: Secondary | ICD-10-CM

## 2017-12-13 DIAGNOSIS — I480 Paroxysmal atrial fibrillation: Secondary | ICD-10-CM

## 2017-12-13 DIAGNOSIS — R55 Syncope and collapse: Secondary | ICD-10-CM | POA: Diagnosis not present

## 2017-12-13 DIAGNOSIS — I10 Essential (primary) hypertension: Secondary | ICD-10-CM

## 2017-12-13 HISTORY — DX: Paroxysmal atrial fibrillation: I48.0

## 2017-12-13 MED ORDER — METOPROLOL TARTRATE 25 MG PO TABS: 12.5000 mg | ORAL_TABLET | Freq: Two times a day (BID) | ORAL | 6 refills | Status: DC

## 2017-12-13 NOTE — Progress Notes (Signed)
Cardiology Office Note:    Date:  12/13/2017   ID:  Fae Pippin, DOB 12/06/1925, MRN [id number]  PCP:  Lajean Manes, MD  Cardiologist:  Shelva Majestic, MD   Referring MD: Lajean Manes, MD   Chief Complaint  Patient presents with  . Follow-up  . Shortness of Breath  . Atrial Fibrillation    History of Present Illness:    KATIEJO GILROY is a 84 y.o. female with a hx of stroke (01/2015), polyarthritis rheumatica, HTN, hx of cerebral aneurysm repair, and depression. She was recently hospitalized on 11/17/17 after a syncopal episode in which she fell and hit her head. In the ED, she was found to have a SAH. As part of her syncope workup, echocardiogram was done and revealed normal LVEF and no PFO. She was discharged with event monitor. On 12/07/17, event monitor detected atrial fibrillation. Anticoagulation was not started over the phone because she has not been evaluated by one of our providers.   She presents today for hospital follow up as a new patient to [facility]. She is here alone, but her daughter drove her.  She reports 2 episodes of palpitations and a rapid heart rate since her discharge.  One episode was on 12/07/2017.  This episode was marked by [employer] with heart rates in the 140s.  She describes an episode last evening.  The rapid heart rate and palpitations lasts approximately 30 minutes.  Examination of her heart monitor revealed A. fib RVR in the 150s last evening.  When she converted to sinus rhythm she had heart rates as low as 39.  On discharge her Coreg dose was reduced to 12 and half twice daily.  EKG today with sinus rhythm with heart rate 46.  She has not had a repeat episode of syncope.  She is largely asymptomatic with her episodes of A. fib RVR -she denies lightheadedness, dizziness, and syncope.  However she does feel the palpitations and tries to rest when they occur. No other changes in her medical record.   She is a widow, husband died last 05-11-23 after  79 years of marriage. She is high-functioning, but does not drive. She is a retired Statistician.   Past Medical History:  Diagnosis Date  . Anemia   . Anemia    iron deficiency anemia and pernicious  . Depression   . H/O cerebral aneurysm repair 2016  . HOH (hard of hearing)    wears bilateral hearing aids  . Hypertension   . Polyarthritis rheumatica ([place])   . Stroke ([place])   . Stroke [facility]) January 25, 2015   No deficits    Past Surgical History:  Procedure Laterality Date  . ABDOMINAL HYSTERECTOMY     complete  . BREAST SURGERY     biopsy- bilateral- benign  . COLONOSCOPY WITH PROPOFOL N/A 08/20/2014   Procedure: COLONOSCOPY WITH PROPOFOL;  Surgeon: Howell Rucks, MD;  Location: [facility];  Service: Endoscopy;  Laterality: N/A;  . EYE SURGERY     bilateral cataract with lens implant  . RADIOLOGY WITH ANESTHESIA N/A 05/20/2015   Procedure: RADIOLOGY WITH ANESTHESIA;  Surgeon: Luanne Bras, MD;  Location: [place];  Service: Radiology;  Laterality: N/A;    Current Medications: Current Meds  Medication Sig  . acetaminophen (TYLENOL) 500 MG tablet Take 500 mg by mouth every 6 (six) hours as needed for mild pain.  Marland Kitchen amLODipine (NORVASC) 10 MG tablet Take 10 mg by mouth daily.  Marland Kitchen atorvastatin (LIPITOR) 40 MG  tablet Take 40 mg by mouth every evening.  . citalopram (CELEXA) 20 MG tablet Take 20 mg by mouth daily.  . Cyanocobalamin (VITAMIN B-12 PO) Take 1 tablet by mouth 2 (two) times a week. Take on Mondays and Wednesdays  . ergocalciferol (VITAMIN D2) 50000 UNITS capsule Take 50,000 Units by mouth once a week. Friday  . ferrous sulfate 325 (65 FE) MG tablet Take 325 mg by mouth 3 (three) times a week. Take on Monday Wednesday Friday  . losartan-hydrochlorothiazide (HYZAAR) 100-25 MG per tablet Take 1 tablet by mouth daily.   . Melatonin 5 MG TABS Take 5 mg by mouth at bedtime as needed (for sleep).  . Multiple Vitamins-Minerals (PRESERVISION AREDS PO) Take 1 capsule by  mouth 2 (two) times daily.  . vitamin B-12 (CYANOCOBALAMIN) 1000 MCG tablet Take 1,000 mcg by mouth every morning.  . [DISCONTINUED] carvedilol (COREG) 25 MG tablet TAKE 1/2 (12.5 MG) TABLET TWICE DAILY.     Allergies:   Lidocaine-epinephrine; Lisinopril; and Lisinopril   Social History   Socioeconomic History  . Marital status: Widowed    Spouse name: Not on file  . Number of children: Not on file  . Years of education: Not on file  . Highest education level: Not on file  Occupational History  . Not on file  Social Needs  . Financial resource strain: Not on file  . Food insecurity:    Worry: Not on file    Inability: Not on file  . Transportation needs:    Medical: Not on file    Non-medical: Not on file  Tobacco Use  . Smoking status: Never Smoker  . Smokeless tobacco: Never Used  Substance and Sexual Activity  . Alcohol use: No  . Drug use: No  . Sexual activity: Not on file  Lifestyle  . Physical activity:    Days per week: Not on file    Minutes per session: Not on file  . Stress: Not on file  Relationships  . Social connections:    Talks on phone: Not on file    Gets together: Not on file    Attends religious service: Not on file    Active member of club or organization: Not on file    Attends meetings of clubs or organizations: Not on file    Relationship status: Not on file  Other Topics Concern  . Not on file  Social History Narrative   ** Merged History Encounter **         Family History: The patient's family history includes CVA in her mother and sister; Stroke in her mother and sister.  ROS:   Please see the history of present illness.     All other systems reviewed and are negative.  EKGs/Labs/Other Studies Reviewed:    The following studies were reviewed today:  Echo 11/18/17: Study Conclusions - Left ventricle: The cavity size was normal. Wall thickness was   increased in a pattern of mild LVH. Systolic function was normal.   The  estimated ejection fraction was in the range of 55% to 60%.   Wall motion was normal; there were no regional wall motion   abnormalities. Left ventricular diastolic function parameters   were normal. - Aortic valve: There was very mild stenosis. - Left atrium: The atrium was moderately dilated. - Atrial septum: No defect or patent foramen ovale was identified. - Pulmonary arteries: PA peak pressure: 31 mm Hg (S).  EKG:  EKG is ordered today.  The  ekg ordered today demonstrates sinus bradycardia with HR 46  Recent Labs: 11/18/2017: ALT 14; TSH 0.622 11/19/2017: BUN 15; Creatinine, Ser 1.33; Hemoglobin 11.0; Magnesium 1.8; Platelets 198; Potassium 3.5; Sodium 139  Recent Lipid Panel    Component Value Date/Time   CHOL 186 01/26/2015 0557   TRIG 351 (H) 01/26/2015 0557   HDL 30 (L) 01/26/2015 0557   CHOLHDL 6.2 01/26/2015 0557   VLDL 70 (H) 01/26/2015 0557   LDLCALC 86 01/26/2015 0557    Physical Exam:    VS:  BP (!) 124/46 (BP Location: Left Arm, Patient Position: Sitting, Cuff Size: Normal)   Pulse (!) 46   Ht 5\' 3"  (1.6 m)   Wt 159 lb (72.1 kg)   BMI 28.17 kg/m     Wt Readings from Last 3 Encounters:  12/13/17 159 lb (72.1 kg)  11/17/17 162 lb 7.7 oz (73.7 kg)  06/18/16 162 lb 9.6 oz (73.8 kg)     GEN: Well nourished, well developed in no acute distress HEENT: Normal NECK: No JVD; No carotid bruits CARDIAC: RRR, no murmurs, rubs, gallops RESPIRATORY:  Clear to auscultation without rales, wheezing or rhonchi  ABDOMEN: Soft, non-tender, non-distended MUSCULOSKELETAL:  No edema; No deformity  SKIN: Warm and dry NEUROLOGIC:  Alert and oriented x 3 PSYCHIATRIC:  Normal affect   ASSESSMENT:    1. Paroxysmal atrial fibrillation (HCC)   2. Sick sinus syndrome ([place])   3. Syncope and collapse   4. Cerebral infarction due to occlusion of other cerebral artery ([place])   5. Aneurysm, carotid artery, internal   6. Essential hypertension    PLAN:    In order of problems  listed above:  Paroxysmal atrial fibrillation (HCC) Possible tachy-brady syndrome Event monitor with evidence of atrial fibrillation with RVR.  Heart rates as high as 140s to 150s.  She spontaneously converts back to sinus rhythm; however, she tends to be bradycardic after conversion.  Case discussed with Dr. Claiborne Billings, DOD. I am concerned about possible tachybradycardia syndrome.  Her Coreg was reduced to 12.5 twice daily on her discharge, which still may be too high of a dose for her.  I will switch this to Lopressor 12.5 mg BID. I will refer her to EP.  This patients CHA2DS2-VASc Score and unadjusted Ischemic Stroke Rate (% per year) is equal to 9.7 % stroke rate/year from a score of 53 (age, female, stroke, HTN). I am hesitant to start an anticoagulation regimen given her recent subarachnoid hemorrhage and remaining cerebral aneurysm.  I will send this note to neurology.  I will let them clear her for anticoagulation if they deem safe and appropriate.   Syncope and collapse This was her first syncopal episode.  She denies dizziness, lightheadedness presyncope, and syncopal episodes.  Cerebral infarction due to occlusion of other cerebral artery (HCC) Aneurysm, carotid artery, internal Stable  Essential hypertension Pressures well controlled.  No medication changes except as above.  Refer to EP. Follow up with [place] in 1-2 months.   Medication Adjustments/Labs and Tests Ordered: Current medicines are reviewed at length with the patient today.  Concerns regarding medicines are outlined above.  Orders Placed This Encounter  Procedures  . Ambulatory referral to Cardiac Electrophysiology  . EKG 12-Lead   Meds ordered this encounter  Medications  . metoprolol tartrate (LOPRESSOR) 25 MG tablet    Sig: Take 0.5 tablets (12.5 mg total) by mouth 2 (two) times daily.    Dispense:  30 tablet    Refill:  6  Signed, Tami Lin Eyob Godlewski, PA  12/13/2017 2:42 PM    [facility]

## 2017-12-13 NOTE — Patient Instructions (Signed)
Medication Instructions:  DISCONTINUE Coreg START Lopressor 12.5mg  Take 1 tablet twice a day   Labwork: None   Testing/Procedures: None  Follow-Up: Your provider recommends that you schedule a follow-up appointment in: [place] EP DR AT [place] physician recommends that you schedule a follow-up appointment in: 1 MONTH AFTER EP VISIT Any Other Special Instructions Will Be Listed Below (If Applicable).  If you need a refill on your cardiac medications before your next appointment, please call your pharmacy.

## 2017-12-14 ENCOUNTER — Ambulatory Visit (HOSPITAL_COMMUNITY)
Admission: RE | Admit: 2017-12-14 | Discharge: 2017-12-14 | Disposition: A | Discharge status: Home / Self Care | Payer: Medicare Other | Source: Ambulatory Visit | Attending: Interventional Radiology | Admitting: Interventional Radiology

## 2017-12-14 DIAGNOSIS — I729 Aneurysm of unspecified site: Secondary | ICD-10-CM

## 2017-12-14 HISTORY — PX: IR RADIOLOGIST EVAL & MGMT: IMG5224

## 2017-12-14 NOTE — Progress Notes (Signed)
Electrophysiology Office Note   Date:  12/15/2017   ID:  Tabitha Sanders, DOB [birth year], MRN [id number]  PCP:  Lajean Manes, MD  Cardiologist:  Claiborne Billings Primary Electrophysiologist:  Julio Storr Meredith Leeds, MD    Chief Complaint  Patient presents with  . Advice Only    PAF/Tachycardia-Bradycardia syndrome     History of Present Illness: Tabitha Sanders is a 84 y.o. female who is being seen today for the evaluation of atrial fibrillation at the request of Angie Duke. Presenting today for electrophysiology evaluation.  She has a history of hypertension, stroke, and atrial fibrillation.  She was hospitalized 10/21/2017 after syncopal episode where she hit her ahead.  She was found to have a subarachnoid hemorrhage.  An echo was done that showed a normal EF and no PFO at the time.  She was discharged with an event monitor that showed atrial fibrillation.  She has had 2 episodes of palpitations with rapid heart rates since her discharge.  1 of the episodes was marked by preventives with heart rates of 140 bpm.  Palpitations generally last approximately 30 minutes.  When she converted to sinus rhythm her heart rates were in the high 30s.  At discharge, her carvedilol dose was reduced to 12.5 mg twice a day.  She has had no further episodes of syncope.  She can tell when she is in atrial fibrillation.  She says that she feels significant palpitations.  She has some weakness and fatigue as well.   Today, she denies symptoms of chest pain, shortness of breath, orthopnea, PND, lower extremity edema, claudication, dizziness, presyncope, syncope, bleeding, or neurologic sequela. The patient is tolerating medications without difficulties.    Past Medical History:  Diagnosis Date  . Anemia   . Anemia    iron deficiency anemia and pernicious  . Depression   . H/O cerebral aneurysm repair 2016  . HOH (hard of hearing)    wears bilateral hearing aids  . Hypertension   . Polyarthritis rheumatica  ([place])   . Stroke ([place])   . Stroke [facility]) January 25, 2015   No deficits   Past Surgical History:  Procedure Laterality Date  . ABDOMINAL HYSTERECTOMY     complete  . BREAST SURGERY     biopsy- bilateral- benign  . COLONOSCOPY WITH PROPOFOL N/A 08/20/2014   Procedure: COLONOSCOPY WITH PROPOFOL;  Surgeon: Howell Rucks, MD;  [facility];  Service: Endoscopy;  Laterality: N/A;  . EYE SURGERY     bilateral cataract with lens implant  . RADIOLOGY WITH ANESTHESIA N/A 05/20/2015   Procedure: RADIOLOGY WITH ANESTHESIA;  Surgeon: Luanne Bras, MD;  Location: [place];  Service: Radiology;  Laterality: N/A;     Current Outpatient Medications  Medication Sig Dispense Refill  . acetaminophen (TYLENOL) 500 MG tablet Take 500 mg by mouth every 6 (six) hours as needed for mild pain.    Marland Kitchen amLODipine (NORVASC) 10 MG tablet Take 10 mg by mouth daily.  6  . atorvastatin (LIPITOR) 40 MG tablet Take 40 mg by mouth every evening.  3  . citalopram (CELEXA) 20 MG tablet Take 20 mg by mouth daily.  3  . Cyanocobalamin (VITAMIN B-12 PO) Take 1 tablet by mouth 2 (two) times a week. Take on Mondays and Wednesdays    . ergocalciferol (VITAMIN D2) 50000 UNITS capsule Take 50,000 Units by mouth once a week. Friday    . ferrous sulfate 325 (65 FE) MG tablet Take 325 mg by mouth 3 (three)  times a week. Take on Monday Wednesday Friday    . losartan-hydrochlorothiazide (HYZAAR) 100-25 MG per tablet Take 1 tablet by mouth daily.     . Melatonin 5 MG TABS Take 5 mg by mouth at bedtime as needed (for sleep).    . Multiple Vitamins-Minerals (PRESERVISION AREDS PO) Take 1 capsule by mouth 2 (two) times daily.    . metoprolol tartrate (LOPRESSOR) 25 MG tablet Take 0.5 tablets (12.5 mg total) by mouth 2 (two) times daily. 45 tablet 3   No current facility-administered medications for this visit.     Allergies:   Lidocaine-epinephrine; Lisinopril; and Lisinopril   Social History:  The patient  reports that she  has never smoked. She has never used smokeless tobacco. She reports that she does not drink alcohol or use drugs.   Family History:  The patient's family history includes CVA in her mother and sister; Stroke in her mother and sister.    ROS:  Please see the history of present illness.   Otherwise, review of systems is positive for none.   All other systems are reviewed and negative.    PHYSICAL EXAM: VS:  BP 130/60   Pulse (!) 46   Ht 5\' 3"  (1.6 m)   Wt 158 lb 6.4 oz (71.8 kg)   BMI 28.06 kg/m  , BMI Body mass index is 28.06 kg/m. GEN: Well nourished, well developed, in no acute distress  HEENT: normal  Neck: no JVD, carotid bruits, or masses Cardiac: RRR; no murmurs, rubs, or gallops,no edema  Respiratory:  clear to auscultation bilaterally, normal work of breathing GI: soft, nontender, nondistended, + BS MS: no deformity or atrophy  Skin: warm and dry Neuro:  Strength and sensation are intact Psych: euthymic mood, full affect  EKG:  EKG is not ordered today. Personal review of the ekg ordered 12/13/17 shows SR, rate 46, LVH  Recent Labs: 11/18/2017: ALT 14; TSH 0.622 11/19/2017: BUN 15; Creatinine, Ser 1.33; Hemoglobin 11.0; Magnesium 1.8; Platelets 198; Potassium 3.5; Sodium 139    Lipid Panel     Component Value Date/Time   CHOL 186 01/26/2015 0557   TRIG 351 (H) 01/26/2015 0557   HDL 30 (L) 01/26/2015 0557   CHOLHDL 6.2 01/26/2015 0557   VLDL 70 (H) 01/26/2015 0557   LDLCALC 86 01/26/2015 0557     Wt Readings from Last 3 Encounters:  12/15/17 158 lb 6.4 oz (71.8 kg)  12/13/17 159 lb (72.1 kg)  06/18/16 162 lb 9.6 oz (73.8 kg)      Other studies Reviewed: Additional studies/ records that were reviewed today include: TTE 11/18/17  Review of the above records today demonstrates:  - Left ventricle: The cavity size was normal. Wall thickness was   increased in a pattern of mild LVH. Systolic function was normal.   The estimated ejection fraction was in the range of  55% to 60%.   Wall motion was normal; there were no regional wall motion   abnormalities. Left ventricular diastolic function parameters   were normal. - Aortic valve: There was very mild stenosis. - Left atrium: The atrium was moderately dilated. - Atrial septum: No defect or patent foramen ovale was identified. - Pulmonary arteries: PA peak pressure: 31 mm Hg (S).   ASSESSMENT AND PLAN:  1.  Paroxysmal atrial fibrillation: Diagnosed on her 30-day monitor after an episode of syncope.  She has rapid rates into the 140s with symptoms of palpitations and then after she converts she is in the 30s.  I discussed with her the options of pacemaker versus medical management.  At this point, she would prefer medical management, we Travelle Mcclimans thus start her on Multitak today.  She is continuing to wear her monitor, and if she has more bradycardia or atrial fibrillation, pacemaker implant may be warranted.  She does have bradycardia at baseline and thus we Shakyla Nolley stop her metoprolol.  Notes have been sent to neurology to discuss options of anticoagulation, as she has had a subarachnoid hemorrhage after her fall.  He is on Celexa and I Kalese Ensz have her discuss this with her primary physician as to whether or not there is another antidepressant that she could start.  This patients CHA2DS2-VASc Score and unadjusted Ischemic Stroke Rate (% per year) is equal to 4.8 % stroke rate/year from a score of 4  Above score calculated as 1 point each if present [CHF, HTN, DM, Vascular=MI/PAD/Aortic Plaque, Age if 65-74, or Female] Above score calculated as 2 points each if present [Age > 75, or Stroke/TIA/TE]  2.  Hypertension: Well-controlled today.  No changes.  3.  Sick sinus syndrome: Has had bradycardia and potentially postconversion bradycardia and pauses.  This could be the cause of her episode of syncope.  In choosing a rhythm control strategy to see if we can prevent postconversion pauses, but if she continues to have  symptoms, she may warrant a pacemaker.  Current medicines are reviewed at length with the patient today.   The patient does not have concerns regarding her medicines.  The following changes were made today: Start Multitak, stop metoprolol  Labs/ tests ordered today include:  No orders of the defined types were placed in this encounter.  Discussed with primary physician  Disposition:   FU with Neila Teem 6 weeks  Signed, Kambrey Hagger Meredith Leeds, MD  12/15/2017 9:10 AM     [facility] [address] [place] [address] [phone] (office) [phone] (fax)

## 2017-12-15 ENCOUNTER — Ambulatory Visit: Payer: Medicare Other | Admitting: Cardiology

## 2017-12-15 ENCOUNTER — Encounter: Payer: Self-pay | Admitting: Cardiology

## 2017-12-15 ENCOUNTER — Encounter (INDEPENDENT_AMBULATORY_CARE_PROVIDER_SITE_OTHER): Payer: Self-pay

## 2017-12-15 VITALS — BP 130/60 | HR 46 | Ht 63.0 in | Wt 158.4 lb

## 2017-12-15 DIAGNOSIS — I495 Sick sinus syndrome: Secondary | ICD-10-CM | POA: Diagnosis not present

## 2017-12-15 DIAGNOSIS — I480 Paroxysmal atrial fibrillation: Secondary | ICD-10-CM | POA: Diagnosis not present

## 2017-12-15 DIAGNOSIS — I10 Essential (primary) hypertension: Secondary | ICD-10-CM

## 2017-12-15 MED ORDER — METOPROLOL TARTRATE 25 MG PO TABS: 25.0000 mg | ORAL_TABLET | Freq: Two times a day (BID) | ORAL | 3 refills | Status: DC

## 2017-12-15 MED ORDER — METOPROLOL TARTRATE 25 MG PO TABS: 12.5000 mg | ORAL_TABLET | Freq: Two times a day (BID) | ORAL | 3 refills | Status: DC

## 2017-12-15 NOTE — Patient Instructions (Addendum)
Medication Instructions: 1) Start Multaq 400 mg twice daily -- DO NOT START THIS MEDICATION UNTIL THE NURSE CALLS YOU AND INFORMS YOU THIS IS OK TO START  If you need a refill on your cardiac medications before your next appointment, please call your pharmacy.   Labwork: None ordered  Procedures/Testing: None ordered  Follow-Up: Your physician recommends that you schedule a follow-up appointment in: 6 weeks with Dr. Curt Bears - we will schedule this once you have started the Multaq   Any Additional Special Instructions Will Be Listed Below (If Applicable).  Please talk to your primary doctor about switching you to a different depression medication.  You canNOT take Multaq and Celexa together. Discuss switching to Duloxetine or an SNRI>

## 2017-12-17 ENCOUNTER — Telehealth: Payer: Self-pay | Admitting: *Deleted

## 2017-12-17 NOTE — Telephone Encounter (Signed)
Pt reports that her PCP is weaning her off her Celexa.  She is going to take 1/2 tablet for a week, then take 1/2 tablet QOD for one wee, then stop. Pt aware I will follow up in the next week/two and inform her when to start Multaq. She is agreeable to plan.

## 2017-12-17 NOTE — Telephone Encounter (Signed)
Please talk to your primary doctor about switching you to a different depression medication.  You canNOT take Multaq and Celexa together. Discuss switching to Duloxetine or an SNRI>

## 2017-12-21 ENCOUNTER — Telehealth: Payer: Self-pay | Admitting: Physician Assistant

## 2017-12-21 DIAGNOSIS — I10 Essential (primary) hypertension: Secondary | ICD-10-CM | POA: Diagnosis not present

## 2017-12-21 DIAGNOSIS — S066X9D Traumatic subarachnoid hemorrhage with loss of consciousness of unspecified duration, subsequent encounter: Secondary | ICD-10-CM | POA: Diagnosis not present

## 2017-12-21 DIAGNOSIS — Z6828 Body mass index (BMI) 28.0-28.9, adult: Secondary | ICD-10-CM | POA: Diagnosis not present

## 2017-12-21 NOTE — Telephone Encounter (Signed)
error 

## 2017-12-22 DIAGNOSIS — I609 Nontraumatic subarachnoid hemorrhage, unspecified: Secondary | ICD-10-CM | POA: Diagnosis not present

## 2017-12-22 DIAGNOSIS — N183 Chronic kidney disease, stage 3 (moderate): Secondary | ICD-10-CM | POA: Diagnosis not present

## 2017-12-22 DIAGNOSIS — I129 Hypertensive chronic kidney disease with stage 1 through stage 4 chronic kidney disease, or unspecified chronic kidney disease: Secondary | ICD-10-CM | POA: Diagnosis not present

## 2017-12-22 DIAGNOSIS — I480 Paroxysmal atrial fibrillation: Secondary | ICD-10-CM | POA: Diagnosis not present

## 2017-12-23 NOTE — Telephone Encounter (Signed)
Signing past encounter of attempted phone call 

## 2018-01-06 ENCOUNTER — Telehealth: Payer: Self-pay | Admitting: *Deleted

## 2018-01-06 MED ORDER — APIXABAN 5 MG PO TABS: 5.0000 mg | ORAL_TABLET | Freq: Two times a day (BID) | ORAL | 0 refills | Status: DC

## 2018-01-06 MED ORDER — DRONEDARONE HCL 400 MG PO TABS: 400.0000 mg | ORAL_TABLET | Freq: Two times a day (BID) | ORAL | 0 refills | Status: DC

## 2018-01-06 NOTE — Telephone Encounter (Signed)
-----   Message from Will Meredith Leeds, MD sent at 01/04/2018 10:13 AM EDT ----- Can we start eliquis. Thanks. ----- Message ----- From: Ledora Bottcher, PA Sent: 01/04/2018   9:16 AM To: Constance Haw, MD  This came back to me for some reason. I believe you saw her in clinic for Afib. Neuro has signed off on anticoagulation (below). I'll leave [facility] decision to you to start.  Thanks Angie    ----- Message ----- From: Garvin Fila, MD Sent: 12/20/2017   3:18 PM To: Tami Lin Duke, PA  Patient had a traumatic subarachnoid hemorrhage and scalp hematoma which was a one time thing hopefully. Risk of stroke from A. Fib far outweighs the risk of recurrent traumatic subarachnoid hemorrhage hence I believe anticoagulation with eliquis will be beneficial. Her large print aneurysm has been coiled successfully and she only has a tiny 2 mm cavernous carotid aneurysm which has a very low risk for bleeding ----- Message ----- From: Ledora Bottcher, PA Sent: 12/13/2017   2:42 PM To: Garvin Fila, MD

## 2018-01-06 NOTE — Telephone Encounter (Signed)
Advised pt of Dr. Macky Lower recommendation. She would like to check on cost to see if she can even afford it. Pt aware I will send Rx to pharmacy and check with her next week, when I return to the office, to see if affordable. Pt is agreeable to plan.

## 2018-01-06 NOTE — Telephone Encounter (Signed)
Multaq Rx sent to local pharmacy for Tabitha Sanders quote. Pt and I agreed to speak next week to see if she can afford this medication.

## 2018-01-07 DIAGNOSIS — H353111 Nonexudative age-related macular degeneration, right eye, early dry stage: Secondary | ICD-10-CM | POA: Diagnosis not present

## 2018-01-07 DIAGNOSIS — H04202 Unspecified epiphora, left lacrimal gland: Secondary | ICD-10-CM | POA: Diagnosis not present

## 2018-01-07 DIAGNOSIS — H05222 Edema of left orbit: Secondary | ICD-10-CM | POA: Diagnosis not present

## 2018-01-07 DIAGNOSIS — H353124 Nonexudative age-related macular degeneration, left eye, advanced atrophic with subfoveal involvement: Secondary | ICD-10-CM | POA: Diagnosis not present

## 2018-01-14 MED ORDER — DRONEDARONE HCL 400 MG PO TABS: 400.0000 mg | ORAL_TABLET | Freq: Two times a day (BID) | ORAL | 6 refills | Status: DC

## 2018-01-14 NOTE — Telephone Encounter (Signed)
Multaq will cost $37.00 for 30 day supply (60 tabs).  Pt states she can afford that. Aware I will leave 2 weeks of samples at front desk.  Her dtr will pick them up.  Pt understands to start medication on 8/9 (she stopped Celexa 2 weeks ago) Advised to call the office if she begins to develop SE after starting. 90mo f/u appt w/ Camnitz scheduled. Pt is agreeable to plan and appreciates all of our help.

## 2018-01-14 NOTE — Telephone Encounter (Signed)
Follow up ° °Pt returning call for nurse °

## 2018-02-04 DIAGNOSIS — F3340 Major depressive disorder, recurrent, in remission, unspecified: Secondary | ICD-10-CM | POA: Diagnosis not present

## 2018-02-04 DIAGNOSIS — I480 Paroxysmal atrial fibrillation: Secondary | ICD-10-CM | POA: Diagnosis not present

## 2018-02-04 DIAGNOSIS — D510 Vitamin B12 deficiency anemia due to intrinsic factor deficiency: Secondary | ICD-10-CM | POA: Diagnosis not present

## 2018-02-04 DIAGNOSIS — N183 Chronic kidney disease, stage 3 (moderate): Secondary | ICD-10-CM | POA: Diagnosis not present

## 2018-02-08 DIAGNOSIS — H35423 Microcystoid degeneration of retina, bilateral: Secondary | ICD-10-CM | POA: Diagnosis not present

## 2018-02-08 DIAGNOSIS — H353122 Nonexudative age-related macular degeneration, left eye, intermediate dry stage: Secondary | ICD-10-CM | POA: Diagnosis not present

## 2018-02-08 DIAGNOSIS — H43813 Vitreous degeneration, bilateral: Secondary | ICD-10-CM | POA: Diagnosis not present

## 2018-02-08 DIAGNOSIS — H353111 Nonexudative age-related macular degeneration, right eye, early dry stage: Secondary | ICD-10-CM | POA: Diagnosis not present

## 2018-02-22 ENCOUNTER — Encounter: Payer: Self-pay | Admitting: Cardiology

## 2018-02-22 ENCOUNTER — Ambulatory Visit: Payer: Medicare Other | Admitting: Cardiology

## 2018-02-22 VITALS — BP 134/62 | HR 58 | Ht 63.0 in | Wt 160.0 lb

## 2018-02-22 DIAGNOSIS — I10 Essential (primary) hypertension: Secondary | ICD-10-CM

## 2018-02-22 DIAGNOSIS — I480 Paroxysmal atrial fibrillation: Secondary | ICD-10-CM | POA: Diagnosis not present

## 2018-02-22 DIAGNOSIS — I495 Sick sinus syndrome: Secondary | ICD-10-CM

## 2018-02-22 MED ORDER — APIXABAN 5 MG PO TABS: 5.0000 mg | ORAL_TABLET | Freq: Two times a day (BID) | ORAL | 6 refills | Status: DC

## 2018-02-22 MED ORDER — APIXABAN 5 MG PO TABS: 5.0000 mg | ORAL_TABLET | Freq: Two times a day (BID) | ORAL | 0 refills | Status: DC

## 2018-02-22 NOTE — Patient Instructions (Addendum)
Medication Instructions:  Your physician has recommended you make the following change in your medication:  1. STOP Metoprolol 2. START Eliquis 5 mg twice daily  * If you need a refill on your cardiac medications before your next appointment, please call your pharmacy.   Labwork: None ordered  Testing/Procedures: None ordered  Follow-Up: Your physician wants you to follow-up in: 6 months with Dr. Curt Bears.  You will receive a reminder letter in the mail two months in advance. If you don't receive a letter, please call our office to schedule the follow-up appointment.  *Please note that any paperwork needing to be filled out by the provider will need to be addressed at the front desk prior to seeing the provider. Please note that any FMLA, disability or other documents regarding health condition is subject to a $25.00 charge that must be received prior to completion of paperwork in the form of a money order or check.  Thank you for choosing [facility]!!   Trinidad Curet, RN [phone]  Any Other Special Instructions Will Be Listed Below (If Applicable).

## 2018-02-22 NOTE — Addendum Note (Signed)
Addended by: [name] on: 02/22/2018 05:34 PM   Modules accepted: Orders

## 2018-02-22 NOTE — Progress Notes (Signed)
Electrophysiology Office Note   Date:  02/22/2018   ID:  Tabitha Sanders, DOB 13-Feb-1926, MRN [id number]  PCP:  Lajean Manes, MD  Cardiologist:  Claiborne Billings Primary Electrophysiologist:  Idabell Picking Meredith Leeds, MD    No chief complaint on file.    History of Present Illness: Tabitha Sanders is a 84 y.o. female who is being seen today for the evaluation of atrial fibrillation at the request of Angie Duke. Presenting today for electrophysiology evaluation.  She has a history of hypertension, stroke, and atrial fibrillation.  She was hospitalized 10/21/2017 after syncopal episode where she hit her ahead.  She was found to have a subarachnoid hemorrhage.  An echo was done that showed a normal EF and no PFO at the time.  She was discharged with an event monitor that showed atrial fibrillation.  She has had 2 episodes of palpitations with rapid heart rates since her discharge.  1 of the episodes was marked by preventives with heart rates of 140 bpm.  Palpitations generally last approximately 30 minutes.  When she converted to sinus rhythm her heart rates were in the high 30s.  At discharge, her carvedilol dose was reduced to 12.5 mg twice a day.  She has had no further episodes of syncope.  She can tell when she is in atrial fibrillation.  She says that she feels significant palpitations.  She has some weakness and fatigue as well.   Today, denies symptoms of palpitations, chest pain, shortness of breath, orthopnea, PND, lower extremity edema, claudication, dizziness, presyncope, syncope, bleeding, or neurologic sequela. The patient is tolerating medications without difficulties.  Overall she is felt well.  She has noted no further episodes of atrial fibrillation.  She has not had any further syncopal episodes.  She continues to do all of her daily activities.  Her only issue is mild fatigue.   Past Medical History:  Diagnosis Date  . Anemia   . Anemia    iron deficiency anemia and pernicious  .  Aneurysm, carotid artery, internal   . Benign paroxysmal positional vertigo   . Brain aneurysm 05/20/2015  . Cardiomegaly 11/17/2017  . Cerebral infarction due to occlusion of other cerebral artery ([place])   . Depression   . Essential hypertension 11/17/2017  . H/O cerebral aneurysm repair 2016  . Head trauma 11/17/2017  . HOH (hard of hearing)    wears bilateral hearing aids  . Hyperlipidemia 11/17/2017  . Hypertension   . Intracranial aneurysm   . Paroxysmal atrial fibrillation ([place]) 12/13/2017  . Polyarthritis rheumatica ([place])   . Stroke ([place])   . Stroke [facility]) January 25, 2015   No deficits  . Subarachnoid bleed ([place]) 11/17/2017  . Syncope and collapse 11/17/2017   Past Surgical History:  Procedure Laterality Date  . ABDOMINAL HYSTERECTOMY     complete  . BREAST SURGERY     biopsy- bilateral- benign  . COLONOSCOPY WITH PROPOFOL N/A 08/20/2014   Procedure: COLONOSCOPY WITH PROPOFOL;  Surgeon: Howell Rucks, MD;  Location: [facility];  Service: Endoscopy;  Laterality: N/A;  . EYE SURGERY     bilateral cataract with lens implant  . IR RADIOLOGIST EVAL & MGMT  12/14/2017  . RADIOLOGY WITH ANESTHESIA N/A 05/20/2015   Procedure: RADIOLOGY WITH ANESTHESIA;  Surgeon: Luanne Bras, MD;  Location: [facility];  Service: Radiology;  Laterality: N/A;     Current Outpatient Medications  Medication Sig Dispense Refill  . acetaminophen (TYLENOL) 500 MG tablet Take 500 mg by mouth every 6 (  six) hours as needed for mild pain.    Marland Kitchen amLODipine (NORVASC) 10 MG tablet Take 10 mg by mouth daily.  6  . atorvastatin (LIPITOR) 40 MG tablet Take 40 mg by mouth every evening.  3  . Cyanocobalamin (VITAMIN B-12 PO) Take 1 tablet by mouth 2 (two) times a week. Take on Mondays and Wednesdays    . dronedarone (MULTAQ) 400 MG tablet Take 1 tablet (400 mg total) by mouth 2 (two) times daily with a meal. 60 tablet 6  . DULoxetine (CYMBALTA) 30 MG capsule Take 30 mg by mouth daily.  11  . ergocalciferol (VITAMIN D2) 50000  UNITS capsule Take 50,000 Units by mouth once a week. Friday    . ferrous sulfate 325 (65 FE) MG tablet Take 325 mg by mouth 3 (three) times a week. Take on Monday Wednesday Friday    . losartan-hydrochlorothiazide (HYZAAR) 100-25 MG per tablet Take 1 tablet by mouth daily.     . Melatonin 5 MG TABS Take 5 mg by mouth at bedtime as needed (for sleep).    . metoprolol tartrate (LOPRESSOR) 25 MG tablet Take 0.5 tablets (12.5 mg total) by mouth 2 (two) times daily. 45 tablet 3  . Multiple Vitamins-Minerals (PRESERVISION AREDS PO) Take 1 capsule by mouth 2 (two) times daily.     No current facility-administered medications for this visit.     Allergies:   Lidocaine-epinephrine; Lisinopril; and Lisinopril   Social History:  The patient  reports that she has never smoked. She has never used smokeless tobacco. She reports that she does not drink alcohol or use drugs.   Family History:  The patient's family history includes CVA in her mother and sister; Stroke in her mother and sister.    ROS:  Please see the history of present illness.   Otherwise, review of systems is positive for fatigue, leg swelling, palpitations, hearing loss, shortness of breath, depression.   All other systems are reviewed and negative.   PHYSICAL EXAM: VS:  BP 134/62   Pulse (!) 58   Ht 5\' 3"  (1.6 m)   Wt 160 lb (72.6 kg)   BMI 28.34 kg/m  , BMI Body mass index is 28.34 kg/m. GEN: Well nourished, well developed, in no acute distress  HEENT: normal  Neck: no JVD, carotid bruits, or masses Cardiac: RRR; no murmurs, rubs, or gallops,no edema  Respiratory:  clear to auscultation bilaterally, normal work of breathing GI: soft, nontender, nondistended, + BS MS: no deformity or atrophy  Skin: warm and dry Neuro:  Strength and sensation are intact Psych: euthymic mood, full affect  EKG:  EKG is ordered today. Personal review of the ekg ordered shows a right sinus rhythm, rate 58, diffuse T wave inversions  Recent  Labs: 11/18/2017: ALT 14; TSH 0.622 11/19/2017: BUN 15; Creatinine, Ser 1.33; Hemoglobin 11.0; Magnesium 1.8; Platelets 198; Potassium 3.5; Sodium 139    Lipid Panel     Component Value Date/Time   CHOL 186 01/26/2015 0557   TRIG 351 (H) 01/26/2015 0557   HDL 30 (L) 01/26/2015 0557   CHOLHDL 6.2 01/26/2015 0557   VLDL 70 (H) 01/26/2015 0557   LDLCALC 86 01/26/2015 0557     Wt Readings from Last 3 Encounters:  02/22/18 160 lb (72.6 kg)  12/15/17 158 lb 6.4 oz (71.8 kg)  12/13/17 159 lb (72.1 kg)      Other studies Reviewed: Additional studies/ records that were reviewed today include: TTE 11/18/17  Review of the above  records today demonstrates:  - Left ventricle: The cavity size was normal. Wall thickness was   increased in a pattern of mild LVH. Systolic function was normal.   The estimated ejection fraction was in the range of 55% to 60%.   Wall motion was normal; there were no regional wall motion   abnormalities. Left ventricular diastolic function parameters   were normal. - Aortic valve: There was very mild stenosis. - Left atrium: The atrium was moderately dilated. - Atrial septum: No defect or patent foramen ovale was identified. - Pulmonary arteries: PA peak pressure: 31 mm Hg (S).   ASSESSMENT AND PLAN:  1.  Paroxysmal atrial fibrillation: I know some 30-day monitor after an episode of syncope.  She has not had any further episodes of palpitations.  She is currently on Multitak.  She is not anticoagulated and thus we Hajar Penninger start her on Eliquis today.  She is having some fatigue, and we Ilee Randleman thus stop her metoprolol.  This patients CHA2DS2-VASc Score and unadjusted Ischemic Stroke Rate (% per year) is equal to 4.8 % stroke rate/year from a score of 4  Above score calculated as 1 point each if present [CHF, HTN, DM, Vascular=MI/PAD/Aortic Plaque, Age if 65-74, or Female] Above score calculated as 2 points each if present [Age > 75, or Stroke/TIA/TE]     2.   Hypertension: I Nykayla Marcelli be elevated today.  Stopping her metoprolol due to fatigue.  Denessa Cavan recheck at her next visit.  3.  Sick sinus syndrome: Has had bradycardia which was likely postconversion pauses.  Has had no further atrial fibrillation or bradycardia symptoms.  No changes.  Current medicines are reviewed at length with the patient today.   The patient does not have concerns regarding her medicines.  The following changes were made today: None  Labs/ tests ordered today include:  No orders of the defined types were placed in this encounter.    Disposition:   FU with Annabelle Rexroad 6 months  Signed, Madisan Bice Meredith Leeds, MD  02/22/2018 3:51 PM     [facility] [address] [place] [address] [phone] (office) [phone] (fax)

## 2018-03-02 DIAGNOSIS — I129 Hypertensive chronic kidney disease with stage 1 through stage 4 chronic kidney disease, or unspecified chronic kidney disease: Secondary | ICD-10-CM | POA: Diagnosis not present

## 2018-03-02 DIAGNOSIS — N183 Chronic kidney disease, stage 3 (moderate): Secondary | ICD-10-CM | POA: Diagnosis not present

## 2018-03-02 DIAGNOSIS — I480 Paroxysmal atrial fibrillation: Secondary | ICD-10-CM | POA: Diagnosis not present

## 2018-03-02 DIAGNOSIS — Z23 Encounter for immunization: Secondary | ICD-10-CM | POA: Diagnosis not present

## 2018-03-14 ENCOUNTER — Telehealth: Payer: Self-pay | Admitting: Cardiology

## 2018-03-14 NOTE — Telephone Encounter (Signed)
° °  Patient states she is returning call to nurse.  Please return call, ok to leave detailed message.

## 2018-03-15 NOTE — Telephone Encounter (Signed)
Pt states she is paying $37/mo for her Eliquis.  She will call the office if she needs pt assistance in the future, she doesn't qualify at this time.

## 2018-03-30 DIAGNOSIS — I480 Paroxysmal atrial fibrillation: Secondary | ICD-10-CM | POA: Diagnosis not present

## 2018-03-30 DIAGNOSIS — I129 Hypertensive chronic kidney disease with stage 1 through stage 4 chronic kidney disease, or unspecified chronic kidney disease: Secondary | ICD-10-CM | POA: Diagnosis not present

## 2018-03-30 DIAGNOSIS — N183 Chronic kidney disease, stage 3 (moderate): Secondary | ICD-10-CM | POA: Diagnosis not present

## 2018-04-26 ENCOUNTER — Ambulatory Visit: Payer: Medicare Other | Admitting: Cardiology

## 2018-08-02 DIAGNOSIS — H903 Sensorineural hearing loss, bilateral: Secondary | ICD-10-CM | POA: Diagnosis not present

## 2018-08-03 DIAGNOSIS — I129 Hypertensive chronic kidney disease with stage 1 through stage 4 chronic kidney disease, or unspecified chronic kidney disease: Secondary | ICD-10-CM | POA: Diagnosis not present

## 2018-08-03 DIAGNOSIS — Z1389 Encounter for screening for other disorder: Secondary | ICD-10-CM | POA: Diagnosis not present

## 2018-08-03 DIAGNOSIS — Z0000 Encounter for general adult medical examination without abnormal findings: Secondary | ICD-10-CM | POA: Diagnosis not present

## 2018-08-03 DIAGNOSIS — N183 Chronic kidney disease, stage 3 (moderate): Secondary | ICD-10-CM | POA: Diagnosis not present

## 2018-08-03 DIAGNOSIS — F3340 Major depressive disorder, recurrent, in remission, unspecified: Secondary | ICD-10-CM | POA: Diagnosis not present

## 2018-08-03 DIAGNOSIS — M8580 Other specified disorders of bone density and structure, unspecified site: Secondary | ICD-10-CM | POA: Diagnosis not present

## 2018-08-09 DIAGNOSIS — H353123 Nonexudative age-related macular degeneration, left eye, advanced atrophic without subfoveal involvement: Secondary | ICD-10-CM | POA: Diagnosis not present

## 2018-08-09 DIAGNOSIS — H35033 Hypertensive retinopathy, bilateral: Secondary | ICD-10-CM | POA: Diagnosis not present

## 2018-08-09 DIAGNOSIS — Z961 Presence of intraocular lens: Secondary | ICD-10-CM | POA: Diagnosis not present

## 2018-08-09 DIAGNOSIS — H353112 Nonexudative age-related macular degeneration, right eye, intermediate dry stage: Secondary | ICD-10-CM | POA: Diagnosis not present

## 2018-08-30 ENCOUNTER — Telehealth: Payer: Self-pay | Admitting: Cardiology

## 2018-08-30 NOTE — Telephone Encounter (Signed)
New Message    Patient canceled appointment for 09/02/18 and needs to reschedule could you please call patient.

## 2018-09-02 ENCOUNTER — Ambulatory Visit: Payer: Medicare Other | Admitting: Cardiology

## 2018-09-27 ENCOUNTER — Telehealth: Payer: Self-pay | Admitting: *Deleted

## 2018-09-27 NOTE — Telephone Encounter (Signed)
Called patient to let them know due to recent [place] and Health Department Protocols, we are not seeing patients in the office. We are instead seeing if they would like to schedule this appointment as a Nurse, children's or Laptop. Patient is aware if they decide to reschedule this appointment, they may not be seen or scheduled for the next 4-6 months. Patient at this time declines Virtual Visits. Patient does not have computer, internet, or smart phone access. Message sent scheduling and nurse.   Concerns and/or Complaints:                    Since your last visit or hospitalization:   1. Have you been having new or worsening chest pain? NO 2. Have you been having new or worsening shortness of breath? Occasionally with a lot of walking.  3. Have you been having new or worsening leg swelling, wt gain, or increase in abdominal girth (pants fitting more tightly)? Very little that goes away with rest. 4. Have you had any passing out spells? NO 5. Have you had any extreme tiredness or fatigue? Low energy recently

## 2018-09-30 NOTE — Telephone Encounter (Signed)
Spoke to pt about telephone visit with Dr. Curt Bears on Tuesday 4/21.  Pt does not use a tablet or computer and only has a land line phone. Pt aware Dr. Curt Bears will call her that morning. Patient verbalized understanding and agreeable to plan.   Verbal consent:    Virtual Visit Pre-Appointment Phone Call  Steps For Call:  1. Confirm consent - "In the setting of the current Covid19 crisis, you are scheduled for a (phone or video) visit with your provider on (date) at (time).  Just as we do with many in-office visits, in order for you to participate in this visit, we must obtain consent.  If you'd like, I can send this to your mychart (if signed up) or email for you to review.  Otherwise, I can obtain your verbal consent now.  All virtual visits are billed to your insurance company just like a normal visit would be.  By agreeing to a virtual visit, we'd like you to understand that the technology does not allow for your provider to perform an examination, and thus may limit your provider's ability to fully assess your condition. If your provider identifies any concerns that need to be evaluated in person, we will make arrangements to do so.  Finally, though the technology is pretty good, we cannot assure that it will always work on either your or our end, and in the setting of a video visit, we may have to convert it to a phone-only visit.  In either situation, we cannot ensure that we have a secure connection.  Are you willing to proceed?" STAFF: Did the patient verbally acknowledge consent to telehealth visit? Document YES/NO here: YES  2. Confirm the BEST phone number to call the day of the visit by including in appointment notes  3. Give patient instructions for WebEx/MyChart download to smartphone as below or Doximity/Doxy.me if video visit (depending on what platform provider is using)  4. Advise patient to be prepared with their blood pressure, heart rate, weight, any heart rhythm information,  their current medicines, and a piece of paper and pen handy for any instructions they may receive the day of their visit  5. Inform patient they will receive a phone call 15 minutes prior to their appointment time (may be from unknown caller ID) so they should be prepared to answer  6. Confirm that appointment type is correct in Epic appointment notes (VIDEO vs PHONE)     TELEPHONE CALL NOTE  Tabitha Sanders has been deemed a candidate for a follow-up tele-health visit to limit community exposure during the Covid-19 pandemic. I spoke with the patient via phone to ensure availability of phone/video source, confirm preferred email & phone number, and discuss instructions and expectations.  I reminded Tabitha Sanders to be prepared with any vital sign and/or heart rhythm information that could potentially be obtained via home monitoring, at the time of her visit. I reminded Tabitha Sanders to expect a phone call at the time of her visit if her visit.  Tabitha Kidney, RN 09/30/2018 3:20 PM   INSTRUCTIONS FOR DOWNLOADING THE [place] APP TO SMARTPHONE  - If Apple, ask patient to go to [employer] and type in WebEx in the search bar. [place] [employer], the blue/green circle. If Android, go to [employer] and type in [employer] in the search bar. The app is free but as with any other app downloads, their phone may require them to verify saved payment information or  Apple/Android password.  - The patient does NOT have to create an account. - On the day of the visit, the assist will walk the patient through joining the meeting with the meeting number/password.  INSTRUCTIONS FOR DOWNLOADING THE MYCHART APP TO SMARTPHONE  - The patient must first make sure to have activated MyChart and know their login information - If Apple, go to [employer] and type in MyChart in the search bar and download the app. If Android, ask patient to go to [employer] and type in [place] in the search  bar and download the app. The app is free but as with any other app downloads, their phone may require them to verify saved payment information or Apple/Android password.  - The patient will need to then log into the app with their MyChart username and password, and select [facility] as their healthcare provider to link the account. When it is time for your visit, go to the MyChart app, find appointments, and click Begin Video Visit. Be sure to Select Allow for your device to access the Microphone and Camera for your visit. You will then be connected, and your provider will be with you shortly.  **If they have any issues connecting, or need assistance please contact MyChart service desk [phone])**  **If using a computer, in order to ensure the best quality for their visit they will need to use either of the following Internet Browsers: [employer], or Google Chrome**  IF USING DOXIMITY or DOXY.ME - The patient will receive a link just prior to their visit, either by text or email (to be determined day of appointment depending on if it's doxy.me or Doximity).     FULL LENGTH CONSENT FOR TELE-HEALTH VISIT   I hereby voluntarily request, consent and authorize [facility] and its employed or contracted physicians, physician assistants, nurse practitioners or other licensed health care professionals (the Practitioner), to provide me with telemedicine health care services (the "Services") as deemed necessary by the treating Practitioner. I acknowledge and consent to receive the Services by the Practitioner via telemedicine. I understand that the telemedicine visit will involve communicating with the Practitioner through live audiovisual communication technology and the disclosure of certain medical information by electronic transmission. I acknowledge that I have been given the opportunity to request an in-person assessment or other available alternative prior to the telemedicine  visit and am voluntarily participating in the telemedicine visit.  I understand that I have the right to withhold or withdraw my consent to the use of telemedicine in the course of my care at any time, without affecting my right to future care or treatment, and that the Practitioner or I may terminate the telemedicine visit at any time. I understand that I have the right to inspect all information obtained and/or recorded in the course of the telemedicine visit and may receive copies of available information for a reasonable fee.  I understand that some of the potential risks of receiving the Services via telemedicine include:  Marland Kitchen Delay or interruption in medical evaluation due to technological equipment failure or disruption; . Information transmitted may not be sufficient (e.g. poor resolution of images) to allow for appropriate medical decision making by the Practitioner; and/or  . In rare instances, security protocols could fail, causing a breach of personal health information.  Furthermore, I acknowledge that it is my responsibility to provide information about my medical history, conditions and care that is complete and accurate to the best of my ability.  I acknowledge that Practitioner's advice, recommendations, and/or decision may be based on factors not within their control, such as incomplete or inaccurate data provided by me or distortions of diagnostic images or specimens that may result from electronic transmissions. I understand that the practice of medicine is not an exact science and that Practitioner makes no warranties or guarantees regarding treatment outcomes. I acknowledge that I will receive a copy of this consent concurrently upon execution via email to the email address I last provided but may also request a printed copy by calling the office of [facility].    I understand that my insurance will be billed for this visit.   I have read or had this consent read to me. . I  understand the contents of this consent, which adequately explains the benefits and risks of the Services being provided via telemedicine.  . I have been provided ample opportunity to ask questions regarding this consent and the Services and have had my questions answered to my satisfaction. . I give my informed consent for the services to be provided through the use of telemedicine in my medical care  By participating in this telemedicine visit I agree to the above.

## 2018-10-03 DIAGNOSIS — I129 Hypertensive chronic kidney disease with stage 1 through stage 4 chronic kidney disease, or unspecified chronic kidney disease: Secondary | ICD-10-CM | POA: Diagnosis not present

## 2018-10-03 DIAGNOSIS — I480 Paroxysmal atrial fibrillation: Secondary | ICD-10-CM | POA: Diagnosis not present

## 2018-10-03 DIAGNOSIS — N183 Chronic kidney disease, stage 3 (moderate): Secondary | ICD-10-CM | POA: Diagnosis not present

## 2018-10-04 ENCOUNTER — Encounter: Payer: Self-pay | Admitting: Cardiology

## 2018-10-04 ENCOUNTER — Other Ambulatory Visit: Payer: Self-pay

## 2018-10-04 ENCOUNTER — Telehealth (INDEPENDENT_AMBULATORY_CARE_PROVIDER_SITE_OTHER): Payer: Medicare Other | Admitting: Cardiology

## 2018-10-04 ENCOUNTER — Other Ambulatory Visit: Payer: Self-pay | Admitting: Cardiology

## 2018-10-04 DIAGNOSIS — I480 Paroxysmal atrial fibrillation: Secondary | ICD-10-CM | POA: Diagnosis not present

## 2018-10-04 NOTE — Progress Notes (Signed)
Electrophysiology TeleHealth Note   Due to national recommendations of social distancing due to COVID 19, an audio/video telehealth visit is felt to be most appropriate for this patient at this time.  See Epic message for the patient's consent to telehealth for [facility].   Date:  10/04/2018   ID:  Tabitha Sanders, DOB Nov 29, 1925, MRN [id number]  Location: patient's home  Provider location: [address], [place]  Evaluation Performed: Follow-up visit  PCP:  Lajean Manes, MD  Cardiologist:  Shelva Majestic, MD  Electrophysiologist:  Dr Curt Bears  Chief Complaint:  AF  History of Present Illness:    Tabitha Sanders is a 85 y.o. female who presents via audio/video conferencing for a telehealth visit today.  Since last being seen in our clinic, the patient reports doing very well.  Today, she denies symptoms of palpitations, chest pain, shortness of breath,  lower extremity edema, dizziness, presyncope, or syncope.  The patient is otherwise without complaint today.  The patient denies symptoms of fevers, chills, cough, or new SOB worrisome for COVID 19.    Today, denies symptoms of chest pain, shortness of breath, orthopnea, PND, lower extremity edema, claudication, dizziness, presyncope, syncope, bleeding, or neurologic sequela. The patient is tolerating medications without difficulties.  She does continue to have episodes of fatigue, though this has improved since stopping her beta-blocker.  She otherwise has been doing well.  She says that she has palpitations rarely and are well controlled by her Multitak.  She does not wish to change her medications at this time.  She is also been getting cold over the last few months.  It is certainly possible that this is due to her Eliquis, but she does not wish to change her medications at this time.  Past Medical History:  Diagnosis Date   Anemia    Anemia    iron deficiency anemia and pernicious   Aneurysm, carotid  artery, internal    Benign paroxysmal positional vertigo    Brain aneurysm 05/20/2015   Cardiomegaly 11/17/2017   Cerebral infarction due to occlusion of other cerebral artery ([place])    Depression    Essential hypertension 11/17/2017   H/O cerebral aneurysm repair 2016   Head trauma 11/17/2017   HOH (hard of hearing)    wears bilateral hearing aids   Hyperlipidemia 11/17/2017   Hypertension    Intracranial aneurysm    Paroxysmal atrial fibrillation ([place]) 12/13/2017   Polyarthritis rheumatica ([place])    Stroke [facility])    Stroke [facility]) January 25, 2015   No deficits   Subarachnoid bleed ([place]) 11/17/2017   Syncope and collapse 11/17/2017    Past Surgical History:  Procedure Laterality Date   ABDOMINAL HYSTERECTOMY     complete   BREAST SURGERY     biopsy- bilateral- benign   COLONOSCOPY WITH PROPOFOL N/A 08/20/2014   Procedure: COLONOSCOPY WITH PROPOFOL;  Surgeon: Howell Rucks, MD;  Location: [facility];  Service: Endoscopy;  Laterality: N/A;   EYE SURGERY     bilateral cataract with lens implant   IR RADIOLOGIST EVAL & MGMT  12/14/2017   RADIOLOGY WITH ANESTHESIA N/A 05/20/2015   Procedure: RADIOLOGY WITH ANESTHESIA;  Surgeon: Luanne Bras, MD;  Location: [place];  Service: Radiology;  Laterality: N/A;    Current Outpatient Medications  Medication Sig Dispense Refill   acetaminophen (TYLENOL) 500 MG tablet Take 500 mg by mouth every 6 (six) hours as needed for mild pain.     amLODipine (NORVASC)  10 MG tablet Take 10 mg by mouth daily.  6   apixaban (ELIQUIS) 5 MG TABS tablet Take 1 tablet (5 mg total) by mouth 2 (two) times daily. 60 tablet 6   apixaban (ELIQUIS) 5 MG TABS tablet Take 1 tablet (5 mg total) by mouth 2 (two) times daily. 60 tablet 0   atorvastatin (LIPITOR) 40 MG tablet Take 40 mg by mouth every evening.  3   Cyanocobalamin (VITAMIN B-12 PO) Take 1 tablet by mouth 2 (two) times a week. Take on Mondays and Wednesdays     dronedarone (MULTAQ) 400 MG  tablet Take 1 tablet (400 mg total) by mouth 2 (two) times daily with a meal. 60 tablet 6   DULoxetine (CYMBALTA) 30 MG capsule Take 30 mg by mouth daily.  11   ergocalciferol (VITAMIN D2) 50000 UNITS capsule Take 50,000 Units by mouth once a week. Friday     ferrous sulfate 325 (65 FE) MG tablet Take 325 mg by mouth 3 (three) times a week. Take on Monday Wednesday Friday     losartan-hydrochlorothiazide (HYZAAR) 100-25 MG per tablet Take 1 tablet by mouth daily.      Melatonin 5 MG TABS Take 5 mg by mouth at bedtime as needed (for sleep).     Multiple Vitamins-Minerals (PRESERVISION AREDS PO) Take 1 capsule by mouth 2 (two) times daily.     No current facility-administered medications for this visit.     Allergies:   Lidocaine-epinephrine; Lisinopril; and Lisinopril   Social History:  The patient  reports that she has never smoked. She has never used smokeless tobacco. She reports that she does not drink alcohol or use drugs.   Family History:  The patient's  family history includes CVA in her mother and sister; Stroke in her mother and sister.   ROS:  Please see the history of present illness.   All other systems are personally reviewed and negative.    Exam:    Vital Signs:  There were no vitals taken for this visit.  Over the phone, no acute distress, no shortness of breath.  Labs/Other Tests and Data Reviewed:    Recent Labs: 11/18/2017: ALT 14; TSH 0.622 11/19/2017: BUN 15; Creatinine, Ser 1.33; Hemoglobin 11.0; Magnesium 1.8; Platelets 198; Potassium 3.5; Sodium 139   Wt Readings from Last 3 Encounters:  02/22/18 160 lb (72.6 kg)  12/15/17 158 lb 6.4 oz (71.8 kg)  12/13/17 159 lb (72.1 kg)     Other studies personally reviewed: Additional studies/ records that were reviewed today include: ECG 02/22/18  Review of the above records today demonstrates:   SR, anterolateral TWI    ASSESSMENT & PLAN:    1.  Paroxysmal atrial fibrillation: On Multaq and eliquis.  She  has been having small episodes of atrial fibrillation, though she is content with her control.  She is also been getting a little bit cold which could be due to her anticoagulation.  I told her that we could switch her to Xarelto, but she wishes to stay on her Eliquis.  No other changes.  This patients CHA2DS2-VASc Score and unadjusted Ischemic Stroke Rate (% per year) is equal to 4.8 % stroke rate/year from a score of 4  Above score calculated as 1 point each if present [CHF, HTN, DM, Vascular=MI/PAD/Aortic Plaque, Age if 65-74, or Female] Above score calculated as 2 points each if present [Age > 75, or Stroke/TIA/TE]  2. Hypertension: Well-controlled.  No changes.  3. Sick sinus syndrome: No obvious  signs of bradycardia.  She does say her heart rates to stay in the 60s.  4.  Fatigue: At this point is unclear to me as the cause of her fatigue.  She does say that she would improved after being taken off of the beta-blocker.  It could be that her thyroid is an issue.  We Charita Lindenberger check a TSH after the coronavirus pandemic opens. COVID 19 screen The patient denies symptoms of COVID 19 at this time.  The importance of social distancing was discussed today.  Follow-up: 6 months  Current medicines are reviewed at length with the patient today.   The patient does not have concerns regarding her medicines.  The following changes were made today:  none  Labs/ tests ordered today include:  No orders of the defined types were placed in this encounter.    Patient Risk:  after full review of this patients clinical status, I feel that they are at moderate risk at this time.  Today, I have spent 11 minutes with the patient with telehealth technology discussing atrial fibrillation.    Signed, Madina Galati Meredith Leeds, MD  10/04/2018 10:57 AM     [facility] [address] [place] [address] [phone] (office) [phone] (fax)

## 2019-01-10 ENCOUNTER — Telehealth: Payer: Self-pay | Admitting: Cardiology

## 2019-01-10 NOTE — Telephone Encounter (Signed)
Followed up w/ pt. She reports that she woke up about 5:00 am this morning and it was going pretty crazy.   States that it only lasted 3 hours. Denies symptoms, just concern. Advised pt to continue to monitor.  Informed that "breakthrough afib" doesn't mean Multaq isn't working.   Advised that if it becomes more frequent and/or SE begin to call office.Advised to go to ED depending on symptoms (pt asking when she should go to ED) & HR and we discussed this further. Pt thanks me for calling and talking with her.  States she feels much better after our conversation.

## 2019-01-10 NOTE — Telephone Encounter (Signed)
New message   Patient c/o Palpitations:  High priority if patient c/o lightheadedness, shortness of breath, or chest pain  1) How long have you had palpitations/irregular HR/ Afib? Are you having the symptoms now?patient states that she has been in AFIB since 5 am today, no   2) Are you currently experiencing lightheadedness, SOB or CP? No   3) Do you have a history of afib (atrial fibrillation) or irregular heart rhythm? Yes   4) Have you checked your BP or HR? (document readings if available): 109/63 bp 80 hr   5) Are you experiencing any other symptoms?no

## 2019-02-02 ENCOUNTER — Other Ambulatory Visit (HOSPITAL_COMMUNITY): Payer: Self-pay | Admitting: Interventional Radiology

## 2019-02-02 DIAGNOSIS — I771 Stricture of artery: Secondary | ICD-10-CM

## 2019-02-02 DIAGNOSIS — I639 Cerebral infarction, unspecified: Secondary | ICD-10-CM

## 2019-02-06 DIAGNOSIS — D6869 Other thrombophilia: Secondary | ICD-10-CM | POA: Diagnosis not present

## 2019-02-06 DIAGNOSIS — I129 Hypertensive chronic kidney disease with stage 1 through stage 4 chronic kidney disease, or unspecified chronic kidney disease: Secondary | ICD-10-CM | POA: Diagnosis not present

## 2019-02-06 DIAGNOSIS — N183 Chronic kidney disease, stage 3 (moderate): Secondary | ICD-10-CM | POA: Diagnosis not present

## 2019-02-06 DIAGNOSIS — I480 Paroxysmal atrial fibrillation: Secondary | ICD-10-CM | POA: Diagnosis not present

## 2019-02-13 DIAGNOSIS — Z79899 Other long term (current) drug therapy: Secondary | ICD-10-CM | POA: Diagnosis not present

## 2019-02-22 ENCOUNTER — Ambulatory Visit (HOSPITAL_COMMUNITY)
Admission: RE | Admit: 2019-02-22 | Discharge: 2019-02-22 | Disposition: A | Discharge status: Home / Self Care | Payer: Medicare Other | Source: Ambulatory Visit | Attending: Interventional Radiology | Admitting: Interventional Radiology

## 2019-02-22 ENCOUNTER — Other Ambulatory Visit: Payer: Self-pay

## 2019-02-22 DIAGNOSIS — I639 Cerebral infarction, unspecified: Secondary | ICD-10-CM | POA: Insufficient documentation

## 2019-02-22 DIAGNOSIS — I771 Stricture of artery: Secondary | ICD-10-CM | POA: Diagnosis not present

## 2019-02-22 DIAGNOSIS — I720 Aneurysm of carotid artery: Secondary | ICD-10-CM | POA: Diagnosis not present

## 2019-02-28 ENCOUNTER — Telehealth (HOSPITAL_COMMUNITY): Payer: Self-pay

## 2019-02-28 NOTE — Telephone Encounter (Signed)
Pt agreed to f/u in 1 year with mri/mra. AW  

## 2019-03-03 DIAGNOSIS — I129 Hypertensive chronic kidney disease with stage 1 through stage 4 chronic kidney disease, or unspecified chronic kidney disease: Secondary | ICD-10-CM | POA: Diagnosis not present

## 2019-03-03 DIAGNOSIS — D6869 Other thrombophilia: Secondary | ICD-10-CM | POA: Diagnosis not present

## 2019-03-03 DIAGNOSIS — N183 Chronic kidney disease, stage 3 (moderate): Secondary | ICD-10-CM | POA: Diagnosis not present

## 2019-03-03 DIAGNOSIS — I480 Paroxysmal atrial fibrillation: Secondary | ICD-10-CM | POA: Diagnosis not present

## 2019-03-23 ENCOUNTER — Encounter: Payer: Self-pay | Admitting: Cardiology

## 2019-03-23 ENCOUNTER — Encounter

## 2019-03-23 ENCOUNTER — Other Ambulatory Visit: Payer: Self-pay

## 2019-03-23 ENCOUNTER — Ambulatory Visit: Payer: Medicare Other | Admitting: Cardiology

## 2019-03-23 VITALS — BP 158/80 | HR 87 | Ht 63.0 in | Wt 160.2 lb

## 2019-03-23 DIAGNOSIS — I480 Paroxysmal atrial fibrillation: Secondary | ICD-10-CM

## 2019-03-23 NOTE — Progress Notes (Signed)
Electrophysiology Office Note   Date:  03/23/2019   ID:  Teresita, Tabitha Sanders 06-Mar-1926, MRN [id number]  PCP:  Lajean Manes, MD  Cardiologist:  Claiborne Billings Primary Electrophysiologist:  Worthy Boschert Meredith Leeds, MD    No chief complaint on file.    History of Present Illness: Tabitha Sanders is a 85 y.o. female who is being seen today for the evaluation of atrial fibrillation at the request of Tabitha Sanders. Presenting today for electrophysiology evaluation.  She has a history of hypertension, stroke, and atrial fibrillation.  She was hospitalized 10/21/2017 after syncopal episode where she hit her ahead.  She was found to have a subarachnoid hemorrhage.  An echo was done that showed a normal EF and no PFO at the time.  She was discharged with an event monitor that showed atrial fibrillation.  She has had 2 episodes of palpitations with rapid heart rates since her discharge.  1 of the episodes was marked by preventives with heart rates of 140 bpm.  Palpitations generally last approximately 30 minutes.  When she converted to sinus rhythm her heart rates were in the high 30s.  At discharge, her carvedilol dose was reduced to 12.5 mg twice a day.  She has had no further episodes of syncope.  She can tell when she is in atrial fibrillation.  She says that she feels significant palpitations.  She has some weakness and fatigue as well.   Today, denies symptoms of palpitations, chest pain, shortness of breath, orthopnea, PND, lower extremity edema, claudication, dizziness, presyncope, syncope, bleeding, or neurologic sequela. The patient is tolerating medications without difficulties.  Overall she is doing well.  She has no chest pain or shortness of breath.  She is able to do all of her daily activities.  She does note atrial fibrillation that occurs approximately once a month for 20 minutes to 3 hours.   Past Medical History:  Diagnosis Date  . Anemia   . Anemia    iron deficiency anemia and pernicious   . Aneurysm, carotid artery, internal   . Benign paroxysmal positional vertigo   . Brain aneurysm 05/20/2015  . Cardiomegaly 11/17/2017  . Cerebral infarction due to occlusion of other cerebral artery ([place])   . Depression   . Essential hypertension 11/17/2017  . H/O cerebral aneurysm repair 2016  . Head trauma 11/17/2017  . HOH (hard of hearing)    wears bilateral hearing aids  . Hyperlipidemia 11/17/2017  . Hypertension   . Intracranial aneurysm   . Paroxysmal atrial fibrillation ([place]) 12/13/2017  . Polyarthritis rheumatica ([place])   . Stroke ([place])   . Stroke [facility]) January 25, 2015   No deficits  . Subarachnoid bleed ([place]) 11/17/2017  . Syncope and collapse 11/17/2017   Past Surgical History:  Procedure Laterality Date  . ABDOMINAL HYSTERECTOMY     complete  . BREAST SURGERY     biopsy- bilateral- benign  . COLONOSCOPY WITH PROPOFOL N/A 08/20/2014   Procedure: COLONOSCOPY WITH PROPOFOL;  Surgeon: Howell Rucks, MD;  [facility];  Service: Endoscopy;  Laterality: N/A;  . EYE SURGERY     bilateral cataract with lens implant  . IR RADIOLOGIST EVAL & MGMT  12/14/2017  . RADIOLOGY WITH ANESTHESIA N/A 05/20/2015   Procedure: RADIOLOGY WITH ANESTHESIA;  Surgeon: Luanne Bras, MD;  Location: [place];  Service: Radiology;  Laterality: N/A;     Current Outpatient Medications  Medication Sig Dispense Refill  . acetaminophen (TYLENOL) 500 MG tablet Take 500 mg by  mouth every 6 (six) hours as needed for mild pain.    Marland Kitchen amLODipine (NORVASC) 10 MG tablet Take 10 mg by mouth daily.  6  . apixaban (ELIQUIS) 5 MG TABS tablet Take 1 tablet (5 mg total) by mouth 2 (two) times daily. 60 tablet 6  . apixaban (ELIQUIS) 5 MG TABS tablet Take 1 tablet (5 mg total) by mouth 2 (two) times daily. 60 tablet 0  . atorvastatin (LIPITOR) 40 MG tablet Take 40 mg by mouth every evening.  3  . Cyanocobalamin (VITAMIN B-12 PO) Take 1 tablet by mouth 2 (two) times a week. Take on Mondays and Wednesdays    .  DULoxetine (CYMBALTA) 30 MG capsule Take 30 mg by mouth daily.  11  . ergocalciferol (VITAMIN D2) 50000 UNITS capsule Take 50,000 Units by mouth once a week. Friday    . ferrous sulfate 325 (65 FE) MG tablet Take 325 mg by mouth 3 (three) times a week. Take on Monday Wednesday Friday    . losartan-hydrochlorothiazide (HYZAAR) 100-25 MG per tablet Take 1 tablet by mouth daily.     . Melatonin 5 MG TABS Take 5 mg by mouth at bedtime as needed (for sleep).    . MULTAQ 400 MG tablet TAKE 1 TABLET BY MOUTH TWICE DAILY WITH A MEAL 60 tablet 11  . Multiple Vitamins-Minerals (PRESERVISION AREDS PO) Take 1 capsule by mouth 2 (two) times daily.     No current facility-administered medications for this visit.     Allergies:   Lidocaine-epinephrine, Lisinopril, and Lisinopril   Social History:  The patient  reports that she has never smoked. She has never used smokeless tobacco. She reports that she does not drink alcohol or use drugs.   Family History:  The patient's family history includes CVA in her mother and sister; Stroke in her mother and sister.    ROS:  Please see the history of present illness.   Otherwise, review of systems is positive for none.   All other systems are reviewed and negative.   PHYSICAL EXAM: VS:  There were no vitals taken for this visit. , BMI There is no height or weight on file to calculate BMI. GEN: Well nourished, well developed, in no acute distress  HEENT: normal  Neck: no JVD, carotid bruits, or masses Cardiac: RRR; no murmurs, rubs, or gallops,no edema  Respiratory:  clear to auscultation bilaterally, normal work of breathing GI: soft, nontender, nondistended, + BS MS: no deformity or atrophy  Skin: warm and dry Neuro:  Strength and sensation are intact Psych: euthymic mood, full affect  EKG:  EKG is ordered today. Personal review of the ekg ordered shows sinus rhythm, LVH with repolarization abnormality  Recent Labs: No results found for requested labs  within last 8760 hours.    Lipid Panel     Component Value Date/Time   CHOL 186 01/26/2015 0557   TRIG 351 (H) 01/26/2015 0557   HDL 30 (L) 01/26/2015 0557   CHOLHDL 6.2 01/26/2015 0557   VLDL 70 (H) 01/26/2015 0557   LDLCALC 86 01/26/2015 0557     Wt Readings from Last 3 Encounters:  02/22/18 160 lb (72.6 kg)  12/15/17 158 lb 6.4 oz (71.8 kg)  12/13/17 159 lb (72.1 kg)      Other studies Reviewed: Additional studies/ records that were reviewed today include: TTE 11/18/17  Review of the above records today demonstrates:  - Left ventricle: The cavity size was normal. Wall thickness was   increased  in a pattern of mild LVH. Systolic function was normal.   The estimated ejection fraction was in the range of 55% to 60%.   Wall motion was normal; there were no regional wall motion   abnormalities. Left ventricular diastolic function parameters   were normal. - Aortic valve: There was very mild stenosis. - Left atrium: The atrium was moderately dilated. - Atrial septum: No defect or patent foramen ovale was identified. - Pulmonary arteries: PA peak pressure: 31 mm Hg (S).   ASSESSMENT AND PLAN:  1.  Paroxysmal atrial fibrillation: Currently on Multitak and Eliquis.  She has had an episode of atrial fibrillation approximately once a month.  These episodes last for 20 minutes at a time.  This point we Paiten Boies continue with current management.  She have more frequent episodes that last longer, would switch to amiodarone.  This patients CHA2DS2-VASc Score and unadjusted Ischemic Stroke Rate (% per year) is equal to 4.8 % stroke rate/year from a score of 4  Above score calculated as 1 point each if present [CHF, HTN, DM, Vascular=MI/PAD/Aortic Plaque, Age if 65-74, or Female] Above score calculated as 2 points each if present [Age > 75, or Stroke/TIA/TE]   2.  Hypertension: Blood pressure remains elevated.  She is been having it adjusted by her primary physician.  No changes.  Merlon Alcorta  discuss with her primary physician.  3.  Sick sinus syndrome: Pauses that have appeared to be postconversion.  No further atrial fibrillation or bradycardia symptoms.  No changes.    Current medicines are reviewed at length with the patient today.   The patient does not have concerns regarding her medicines.  The following changes were made today: None  Labs/ tests ordered today include:  No orders of the defined types were placed in this encounter.    Disposition:   FU with Zachariah Pavek 12 months  Signed, Maxim Bedel Meredith Leeds, MD  03/23/2019 10:57 AM     [facility] [address] [place] [address] [phone] (office) [phone] (fax)

## 2019-03-23 NOTE — Patient Instructions (Addendum)
Medication Instructions:  Your physician recommends that you continue on your current medications as directed. Please refer to the Current Medication list given to you today.  * If you need a refill on your cardiac medications before your next appointment, please call your pharmacy.   Labwork: None ordered  Testing/Procedures: None ordered  Follow-Up: At [facility], you and your health needs are our priority.  As part of our continuing mission to provide you with exceptional heart care, we have created designated Provider Care Teams.  These Care Teams include your primary Cardiologist (physician) and Advanced Practice Providers (APPs -  Physician Assistants and Nurse Practitioners) who all work together to provide you with the care you need, when you need it.  You will need a follow up appointment in 6 months.  Please call our office 2 months in advance to schedule this appointment.  You may see Dr Curt Bears or one of the following Advanced Practice Providers on your designated Care Team:    Chanetta Marshall, NP  Tommye Standard, PA-C  Oda Kilts, [place]   Your physician wants you to follow-up in: 1 year with Dr. Curt Bears.  You will receive a reminder letter in the mail two months in advance. If you don't receive a letter, please call our office to schedule the follow-up appointment. Thank you for choosing [facility]!!   Trinidad Curet, RN [phone]

## 2019-04-03 DIAGNOSIS — D6869 Other thrombophilia: Secondary | ICD-10-CM | POA: Diagnosis not present

## 2019-04-03 DIAGNOSIS — I480 Paroxysmal atrial fibrillation: Secondary | ICD-10-CM | POA: Diagnosis not present

## 2019-04-03 DIAGNOSIS — N1832 Chronic kidney disease, stage 3b: Secondary | ICD-10-CM | POA: Diagnosis not present

## 2019-04-03 DIAGNOSIS — I129 Hypertensive chronic kidney disease with stage 1 through stage 4 chronic kidney disease, or unspecified chronic kidney disease: Secondary | ICD-10-CM | POA: Diagnosis not present

## 2019-04-09 ENCOUNTER — Other Ambulatory Visit: Payer: Self-pay | Admitting: Cardiology

## 2019-04-10 NOTE — Telephone Encounter (Signed)
Pt last saw Dr Curt Bears 03/23/19, last labs 03/03/19 Creat 1.41 at [facility] per [facility], age 85, weight 72.7kg, based on specified criteria pt is on appropriate dosage of Eliquis 5mg  BID.  Will refill rx.

## 2019-05-01 ENCOUNTER — Ambulatory Visit: Payer: Medicare Other | Admitting: Cardiology

## 2019-05-28 IMAGING — MR MR HEAD WO/W CM
10 of 13 series · 29 of 48 positions shown · IV contrast (15    MULTIHANCE)
Comparison: 04/20/2016

CLINICAL DATA: Cerebral aneurysm follow-up. Left ICA aneurysm
treated with pipeline device.

EXAM:
MRI HEAD WITHOUT AND WITH CONTRAST
MRA HEAD WITHOUT CONTRAST
TECHNIQUE: Multiplanar, multiecho pulse sequences of the brain and surrounding
structures were obtained without and with intravenous contrast.
Angiographic images of the head were obtained using MRA technique
without contrast.
CONTRAST:  15mL MULTIHANCE GADOBENATE DIMEGLUMINE 529 MG/ML IV SOLN

[Series 3: DWI · axial · 3.0mm · 1.09mm/px · z∈[-68,+64]mm · 6 of 90 slices shown (1 of 4)]
[im 1/90]
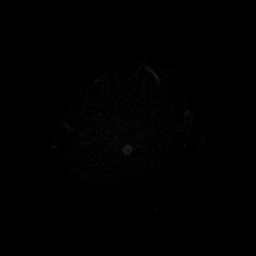
[im 18/90]
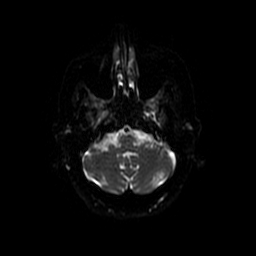
[im 36/90]
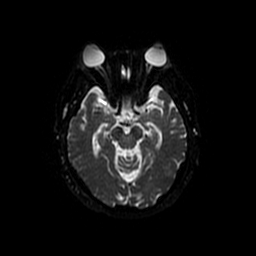
[im 54/90]
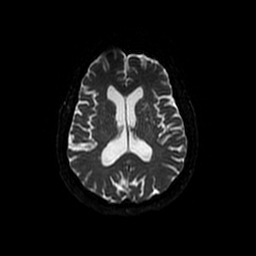
[im 72/90]
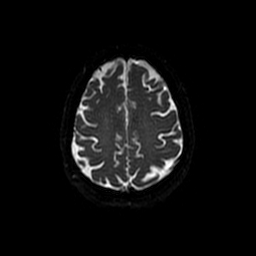
[im 90/90]
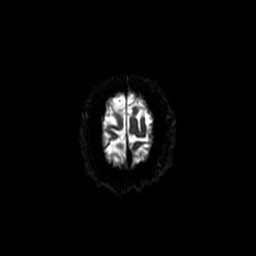

[Series 4: (id) mt fs · axial · 1.4mm · 0.43mm/px · z∈[-88,-53]mm · 3 of 152 slices shown]
[im 1/152]
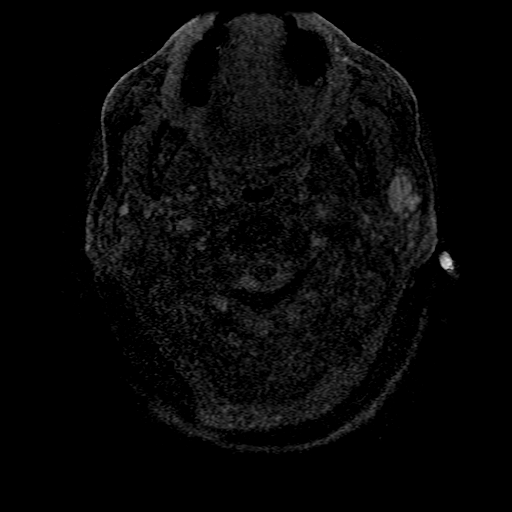
[im 17/152]
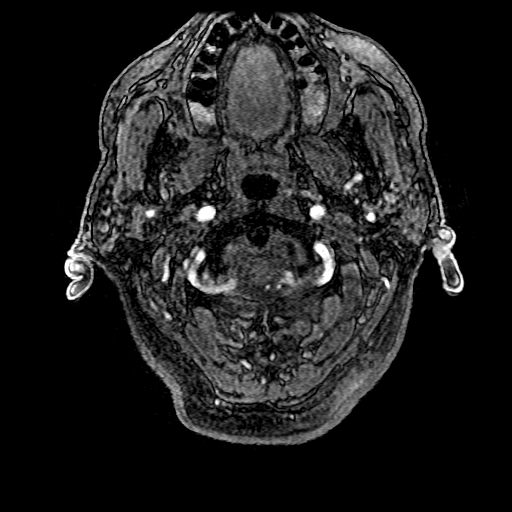
[im 51/152]
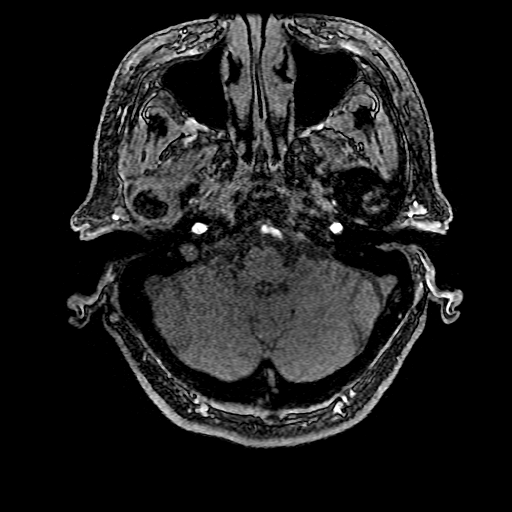

[Series 5: T1 · sagittal · 5.0mm · 0.47mm/px · 2 of 23 slices shown]
[im 1/23]
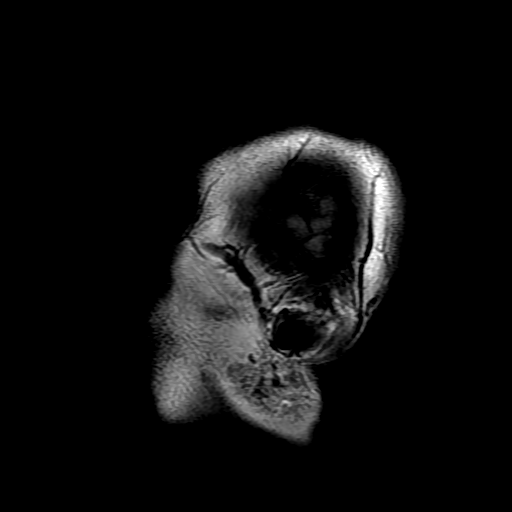
[im 23/23]
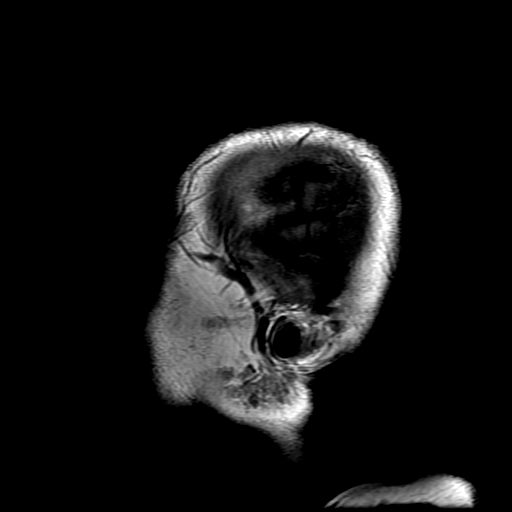

[Series 6: T2 · axial · 5.0mm · 0.43mm/px · z∈[-68,+64]mm · 2 of 23 slices shown]
[im 1/23]
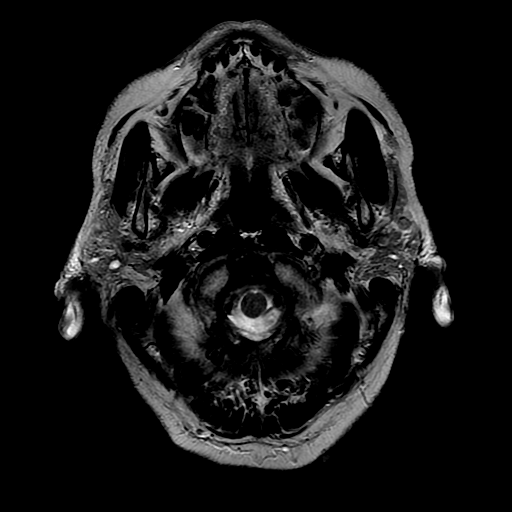
[im 23/23]
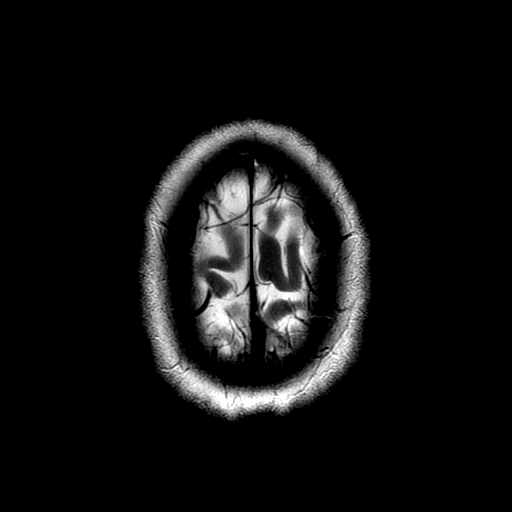

[Series 7: DWI · coronal · 5.0mm · 1.09mm/px · 5 of 64 slices shown (2 of 4)]
[im 1/64]
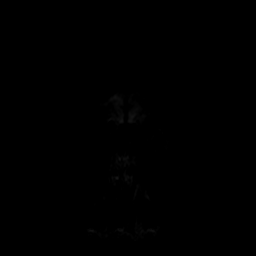
[im 16/64]
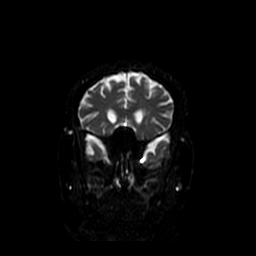
[im 32/64]
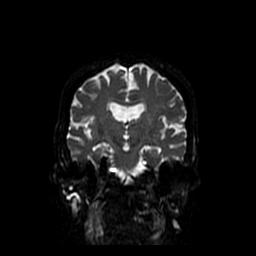
[im 48/64]
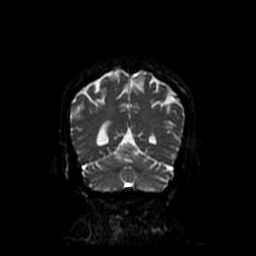
[im 64/64]
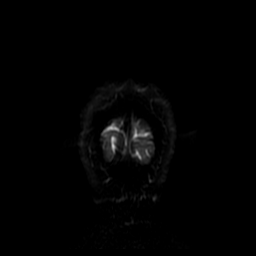

[Series 8: FLAIR · axial · 5.0mm · 0.43mm/px · z∈[-68,+64]mm · 2 of 23 slices shown]
[im 1/23]
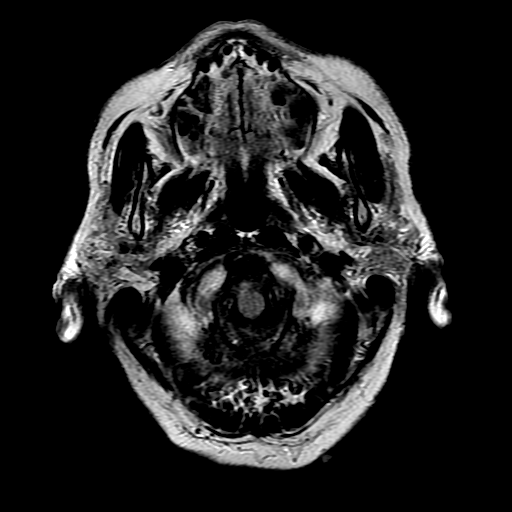
[im 23/23]
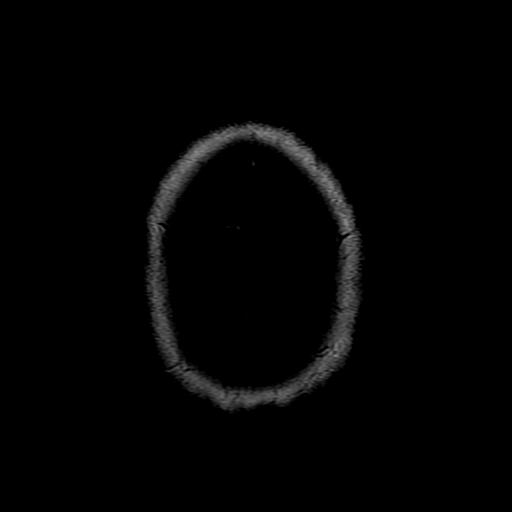

[Series 11: T2 post-contrast · coronal · 5.0mm · 0.39mm/px · 2 of 25 slices shown]
[im 1/25]
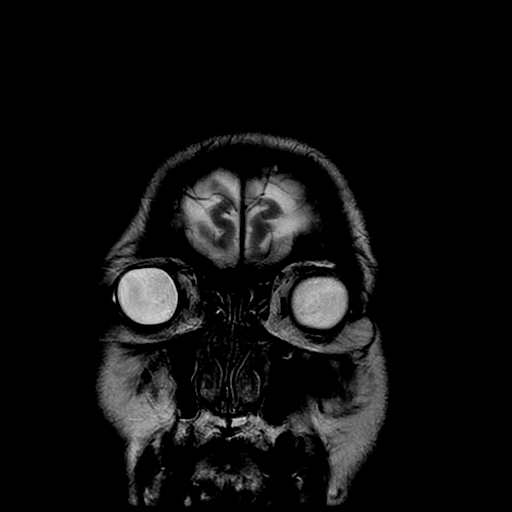
[im 25/25]
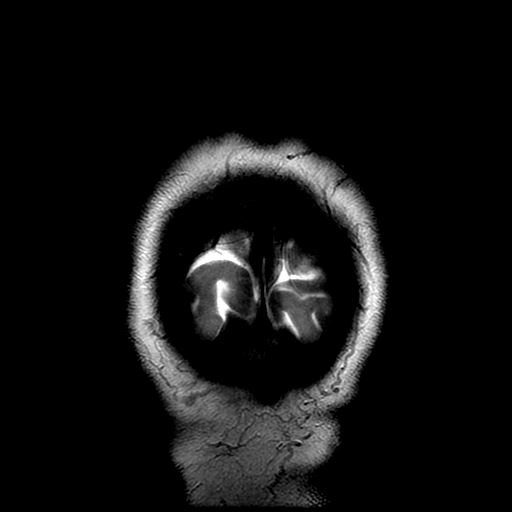

[Series 13: T1 post-contrast · coronal · 5.0mm · 0.39mm/px · 2 of 25 slices shown]
[im 1/25]
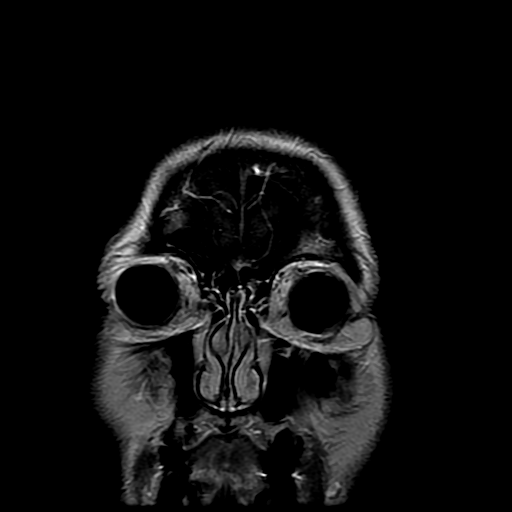
[im 25/25]
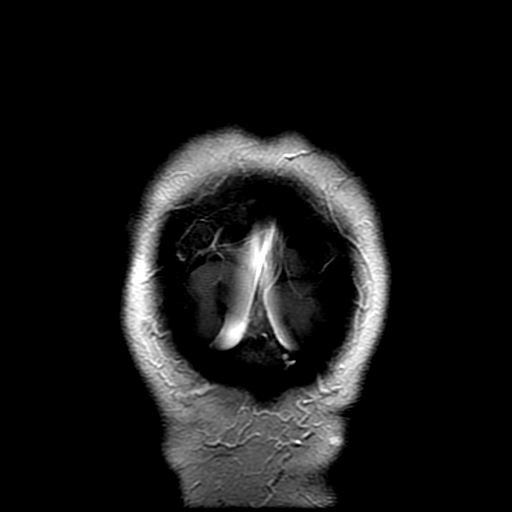

[Series 300: DWI · axial · 3.0mm · 1.09mm/px · z∈[-68,+64]mm · 3 of 45 slices shown (3 of 4)]
[im 1/45]
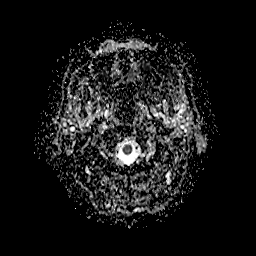
[im 23/45]
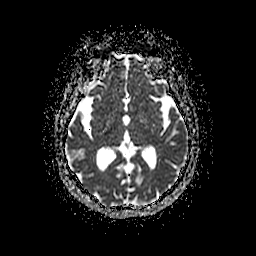
[im 45/45]
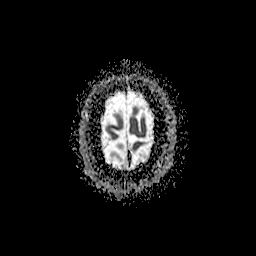

[Series 700: DWI · coronal · 5.0mm · 1.09mm/px · 2 of 32 slices shown (4 of 4)]
[im 1/32]
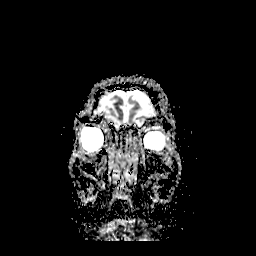
[im 32/32]
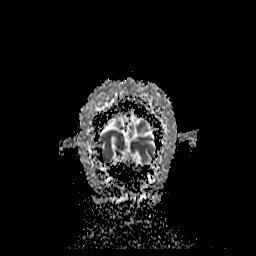

[29 of 48 positions shown; findings below may reference images not displayed]

FINDINGS: MRI HEAD FINDINGS

Brain: There is no evidence of acute infarct, intracranial
hemorrhage, mass, midline shift, or extra-axial fluid collection.
Mild cerebral atrophy is unchanged and within normal limits for age.
Subcortical and deep cerebral white matter T2 hyperintensities are
similar to the prior study and nonspecific but compatible with mild
chronic small vessel ischemic disease. No abnormal enhancement is
identified.

Vascular: Major intracranial vascular flow voids are preserved.

Skull and upper cervical spine: Unremarkable bone marrow signal.

Sinuses/Orbits: Bilateral cataract extraction. Small left mastoid
effusion. Clear mastoids scratch sec clear paranasal sinuses.

Other: 1.5 cm multi-cystic/septated lesion in the left parotid gland
has not significantly changed.

MRA HEAD FINDINGS

The visualized distal vertebral arteries are widely patent to the
basilar and codominant. Left PICA is patent. Right PICA is again not
visualized. Right AICA is grossly patent. Left AICA is poorly
visualized. SCA origins are patent. Basilar artery is patent. There
is a patent medium-sized right posterior communicating artery. PCAs
are patent without evidence of significant stenosis. Fenestration of
the basilar tip/proximal left PCA is again incidentally noted.

The intracranial right internal carotid artery is widely patent with
an unchanged 2 mm aneurysm of the horizontal cavernous segment. The
intracranial left ICA is patent, although there is persistent loss
of signal at the level of the pipeline stent, unchanged from the
prior study. No definite residual flow related enhancement is
identified within the treated left cavernous aneurysm. ACAs and MCAs
are patent without evidence of proximal branch occlusion or
significant stenosis. No new aneurysm is identified.
IMPRESSION: 1. Mild chronic small vessel ischemic disease without acute
abnormality.
2. Unchanged appearance of the left ICA following pipeline stent
placement. No residual aneurysm flow related enhancement.
3. Unchanged tiny right cavernous ICA aneurysm.

## 2019-06-07 ENCOUNTER — Ambulatory Visit: Payer: Medicare Other | Attending: Internal Medicine

## 2019-06-07 DIAGNOSIS — Z20828 Contact with and (suspected) exposure to other viral communicable diseases: Secondary | ICD-10-CM | POA: Diagnosis not present

## 2019-06-07 DIAGNOSIS — Z20822 Contact with and (suspected) exposure to covid-19: Secondary | ICD-10-CM

## 2019-06-08 LAB — NOVEL CORONAVIRUS, NAA: SARS-CoV-2, NAA: NOT DETECTED

## 2019-06-19 ENCOUNTER — Telehealth: Payer: Self-pay

## 2019-06-19 NOTE — Telephone Encounter (Signed)
Caller given negative result and verbalized understanding  

## 2019-07-26 DIAGNOSIS — I480 Paroxysmal atrial fibrillation: Secondary | ICD-10-CM | POA: Diagnosis not present

## 2019-07-26 DIAGNOSIS — M8580 Other specified disorders of bone density and structure, unspecified site: Secondary | ICD-10-CM | POA: Diagnosis not present

## 2019-07-26 DIAGNOSIS — D510 Vitamin B12 deficiency anemia due to intrinsic factor deficiency: Secondary | ICD-10-CM | POA: Diagnosis not present

## 2019-07-26 DIAGNOSIS — I129 Hypertensive chronic kidney disease with stage 1 through stage 4 chronic kidney disease, or unspecified chronic kidney disease: Secondary | ICD-10-CM | POA: Diagnosis not present

## 2019-08-08 DIAGNOSIS — H353123 Nonexudative age-related macular degeneration, left eye, advanced atrophic without subfoveal involvement: Secondary | ICD-10-CM | POA: Diagnosis not present

## 2019-08-08 DIAGNOSIS — H353112 Nonexudative age-related macular degeneration, right eye, intermediate dry stage: Secondary | ICD-10-CM | POA: Diagnosis not present

## 2019-08-08 DIAGNOSIS — H35033 Hypertensive retinopathy, bilateral: Secondary | ICD-10-CM | POA: Diagnosis not present

## 2019-08-08 DIAGNOSIS — Z961 Presence of intraocular lens: Secondary | ICD-10-CM | POA: Diagnosis not present

## 2019-08-22 DIAGNOSIS — H353111 Nonexudative age-related macular degeneration, right eye, early dry stage: Secondary | ICD-10-CM | POA: Diagnosis not present

## 2019-08-22 DIAGNOSIS — H903 Sensorineural hearing loss, bilateral: Secondary | ICD-10-CM | POA: Diagnosis not present

## 2019-08-22 DIAGNOSIS — H353122 Nonexudative age-related macular degeneration, left eye, intermediate dry stage: Secondary | ICD-10-CM | POA: Diagnosis not present

## 2019-08-22 DIAGNOSIS — H43813 Vitreous degeneration, bilateral: Secondary | ICD-10-CM | POA: Diagnosis not present

## 2019-08-22 DIAGNOSIS — H35423 Microcystoid degeneration of retina, bilateral: Secondary | ICD-10-CM | POA: Diagnosis not present

## 2019-08-28 DIAGNOSIS — I480 Paroxysmal atrial fibrillation: Secondary | ICD-10-CM | POA: Diagnosis not present

## 2019-08-28 DIAGNOSIS — D510 Vitamin B12 deficiency anemia due to intrinsic factor deficiency: Secondary | ICD-10-CM | POA: Diagnosis not present

## 2019-08-28 DIAGNOSIS — I129 Hypertensive chronic kidney disease with stage 1 through stage 4 chronic kidney disease, or unspecified chronic kidney disease: Secondary | ICD-10-CM | POA: Diagnosis not present

## 2019-08-28 DIAGNOSIS — M8580 Other specified disorders of bone density and structure, unspecified site: Secondary | ICD-10-CM | POA: Diagnosis not present

## 2019-09-01 DIAGNOSIS — Z0000 Encounter for general adult medical examination without abnormal findings: Secondary | ICD-10-CM | POA: Diagnosis not present

## 2019-09-01 DIAGNOSIS — I129 Hypertensive chronic kidney disease with stage 1 through stage 4 chronic kidney disease, or unspecified chronic kidney disease: Secondary | ICD-10-CM | POA: Diagnosis not present

## 2019-09-01 DIAGNOSIS — Z1389 Encounter for screening for other disorder: Secondary | ICD-10-CM | POA: Diagnosis not present

## 2019-09-01 DIAGNOSIS — M8580 Other specified disorders of bone density and structure, unspecified site: Secondary | ICD-10-CM | POA: Diagnosis not present

## 2019-09-01 DIAGNOSIS — E782 Mixed hyperlipidemia: Secondary | ICD-10-CM | POA: Diagnosis not present

## 2019-09-13 DIAGNOSIS — H903 Sensorineural hearing loss, bilateral: Secondary | ICD-10-CM | POA: Diagnosis not present

## 2019-09-17 NOTE — Progress Notes (Signed)
Cardiology Office Note Date:  09/19/2019  Patient ID:  Tabitha Sanders, Tabitha Sanders 1926-02-25, MRN [id number] PCP:  Lajean Manes, MD  Cardiologist:  Dr. Claiborne Billings EP: Dr. Curt Bears    Chief Complaint: 71mo visit   History of Present Illness: Tabitha Sanders is a 86 y.o. female with history of stent assisted coiling of the left cavernous carotid artery aneurysm on 05/20/15, ICA stroke 2016, HTN, AFib, PMR.    She was hospitalized 10/21/2017 after syncopal episode where she hit her ahead.  She was found to have a subarachnoid hemorrhage.  An echo was done that showed a normal EF and no PFO at the time.  She was discharged with an event monitor that showed atrial fibrillation. She has had 2 episodes of palpitations with rapid heart rates since her discharge.  1 of the episodes was marked by preventice with heart rates of 140 bpm.  Palpitations generally last approximately 30 minutes.  When she converted to sinus rhythm her heart rates were in the high 30s.  At discharge, her carvedilol dose was reduced to 12.5 mg twice a day.  She was referred to EP in the out patient setting,  She last saw Dr. Curt Bears Oct 2020 She had not had further syncope, was aware of her Afib with palpitations, associated weakness, fatigue.  Approx 1x permo, duration 46min-3hours. No changes were made, with plans if she had increased buren of her Afib with frequency and or duration may switch Multaq to amiodarone. SSSx with what appeared post conversion pauses.  Follow, she had not had further syncope.  She was happy to have the appt today, mentions she felt like she has been in AFib sine Sat, 4 days ago.  She is aware of her heart beat irregular, feeling different.  No associated SOB or CP, but more fatigued then usual.  No dizziness, no near syncope or syncope. No symptoms of PND or orthopnea She feels as she nears 86y/o that she is slower then she used to be, tires a little quicker, but the fatigue differenet the last few days  with her palpitations. She is very worried about this since this is the 1st time she has felt out of rhythm longer then a few hours.  She cannot recall the last time she had Afib prior to now.  No bleeding or signs of bleeding  She denies symptoms of illness, no fever, chills, cough, urinary symptoms.    Afib Hx Diagnosed 2019  AAD Hx Multaq started July 2019 >> current  Past Medical History:  Diagnosis Date  . Anemia   . Anemia    iron deficiency anemia and pernicious  . Aneurysm, carotid artery, internal   . Benign paroxysmal positional vertigo   . Brain aneurysm 05/20/2015  . Cardiomegaly 11/17/2017  . Cerebral infarction due to occlusion of other cerebral artery ([place])   . Depression   . Essential hypertension 11/17/2017  . H/O cerebral aneurysm repair 2016  . Head trauma 11/17/2017  . HOH (hard of hearing)    wears bilateral hearing aids  . Hyperlipidemia 11/17/2017  . Hypertension   . Intracranial aneurysm   . Paroxysmal atrial fibrillation ([place]) 12/13/2017  . Polyarthritis rheumatica ([place])   . Stroke ([place])   . Stroke [facility]) January 25, 2015   No deficits  . Subarachnoid bleed ([place]) 11/17/2017  . Syncope and collapse 11/17/2017    Past Surgical History:  Procedure Laterality Date  . ABDOMINAL HYSTERECTOMY     complete  . BREAST SURGERY  biopsy- bilateral- benign  . COLONOSCOPY WITH PROPOFOL N/A 08/20/2014   Procedure: COLONOSCOPY WITH PROPOFOL;  Surgeon: Howell Rucks, MD;  Location: [facility];  Service: Endoscopy;  Laterality: N/A;  . EYE SURGERY     bilateral cataract with lens implant  . IR RADIOLOGIST EVAL & MGMT  12/14/2017  . RADIOLOGY WITH ANESTHESIA N/A 05/20/2015   Procedure: RADIOLOGY WITH ANESTHESIA;  Surgeon: Luanne Bras, MD;  Location: [facility];  Service: Radiology;  Laterality: N/A;    Current Outpatient Medications  Medication Sig Dispense Refill  . acetaminophen (TYLENOL) 500 MG tablet Take 500 mg by mouth every 6 (six) hours as needed for mild  pain.    Marland Kitchen amLODipine (NORVASC) 10 MG tablet Take 10 mg by mouth daily.  6  . atorvastatin (LIPITOR) 40 MG tablet Take 40 mg by mouth every evening.  3  . Cyanocobalamin (VITAMIN B-12 PO) Take 1 tablet by mouth 2 (two) times a week. Take on Mondays and Wednesdays    . DULoxetine (CYMBALTA) 30 MG capsule Take 30 mg by mouth daily.  11  . ELIQUIS 5 MG TABS tablet TAKE 1 TABLET(5 MG) BY MOUTH TWICE DAILY 180 tablet 1  . ergocalciferol (VITAMIN D2) 50000 UNITS capsule Take 50,000 Units by mouth once a week. Friday    . ferrous sulfate 325 (65 FE) MG tablet Take 325 mg by mouth 3 (three) times a week. Take on Monday Wednesday Friday    . hydrALAZINE (APRESOLINE) 25 MG tablet Take 25 mg by mouth 3 (three) times daily.    . irbesartan (AVAPRO) 150 MG tablet Take 150 mg by mouth daily.    . Melatonin 5 MG TABS Take 5 mg by mouth at bedtime as needed (for sleep).    . MULTAQ 400 MG tablet TAKE 1 TABLET BY MOUTH TWICE DAILY WITH A MEAL 60 tablet 11  . Multiple Vitamins-Minerals (PRESERVISION AREDS PO) Take 1 capsule by mouth 2 (two) times daily.     No current facility-administered medications for this visit.    Allergies:   Lidocaine-epinephrine, Lisinopril, and Lisinopril   Social History:  The patient  reports that she has never smoked. She has never used smokeless tobacco. She reports that she does not drink alcohol or use drugs.   Family History:  The patient's family history includes CVA in her mother and sister; Stroke in her mother and sister.  ROS:  Please see the history of present illness. All other systems are reviewed and otherwise negative.   PHYSICAL EXAM:  VS:  BP (!) 174/52   Pulse (!) 110   Ht 5\' 3"  (1.6 m)   Wt 154 lb (69.9 kg)   SpO2 95%   BMI 27.28 kg/m  BMI: Body mass index is 27.28 kg/m. Well nourished, well developed, in no acute distress  HEENT: normocephalic, atraumatic  Neck: no JVD, carotid bruits or masses Cardiac:  RRR; a couple extrasystoles, 1-2/6 SM, no  rubs, or gallops Lungs:  CTA b/l, no wheezing, rhonchi or rales  Abd: soft, nontender MS: no deformity, age appropriateatrophy Ext: trace edema, L>R reported as chronic and unchanged by the patient  Skin: warm and dry, no rash Neuro:  No gross deficits appreciated Psych: euthymic mood, full affect   EKG:  ST 110bpm, PACs, faster, otherwise no significant change from prior   11/18/2017: TTE Study Conclusions  - Left ventricle: The cavity size was normal. Wall thickness was  increased in a pattern of mild LVH. Systolic function was normal.  The  estimated ejection fraction was in the range of 55% to 60%.  Wall motion was normal; there were no regional wall motion  abnormalities. Left ventricular diastolic function parameters  were normal.  - Aortic valve: There was very mild stenosis.  - Left atrium: The atrium was moderately dilated.  - Atrial septum: No defect or patent foramen ovale was identified.  - Pulmonary arteries: PA peak pressure: 31 mm Hg (S).    Recent Labs: No results found for requested labs within last 8760 hours.  No results found for requested labs within last 8760 hours.   CrCl cannot be calculated (Patient's most recent lab result is older than the maximum 21 days allowed.).   Wt Readings from Last 3 Encounters:  09/19/19 154 lb (69.9 kg)  03/23/19 160 lb 3.2 oz (72.7 kg)  02/22/18 160 lb (72.6 kg)     Other studies reviewed: Additional studies/records reviewed today include: summarized above  ASSESSMENT AND PLAN:  1. Paroxysmal Afib 2. SSSx     On Multaq      Low burden by symptoms     CHA2DS2Vasc is 7, on eliquis, appropriately dosed for weight/Creat  She is ST w/PACs here, certainly could have been in Afib prior to coming in. She felt like she had been out of rhythm for the last 4 days, was quite worrisome to her. We discussed at length AFib in effort to reduce her anxiety about it.  Outside of more tired, she was without symptoms with  her palpitations.  She was ST 11bpm, 174/52 After sitting a while and talking a recheck in her BP was 142/70 and her HR 96 She reports her home BP typically 140'60's  Will make no medicine changes Check labs, BMET and CBC. She is quite anxious about her Afib and will see her back in 2 weeks, sooner if needed  3. HTN      Better, follow  Disposition: as above  Current medicines are reviewed at length with the patient today.  The patient did not have any concerns regarding medicines.  Venetia Night, PA-C 09/19/2019 3:52 PM     [place] [address] [phone] (office)  [phone] (fax)

## 2019-09-19 ENCOUNTER — Other Ambulatory Visit: Payer: Self-pay

## 2019-09-19 ENCOUNTER — Ambulatory Visit: Payer: Medicare Other | Admitting: Physician Assistant

## 2019-09-19 VITALS — BP 174/52 | HR 110 | Ht 63.0 in | Wt 154.0 lb

## 2019-09-19 DIAGNOSIS — I480 Paroxysmal atrial fibrillation: Secondary | ICD-10-CM

## 2019-09-19 DIAGNOSIS — I10 Essential (primary) hypertension: Secondary | ICD-10-CM

## 2019-09-19 NOTE — Patient Instructions (Signed)
Medication Instructions:  ° °Your physician recommends that you continue on your current medications as directed. Please refer to the Current Medication list given to you today. ° ° °*If you need a refill on your cardiac medications before your next appointment, please call your pharmacy* ° ° °Lab Work: BMET AND CBC TODAY  ° °If you have labs (blood work) drawn today and your tests are completely normal, you will receive your results only by: °MyChart Message (if you have MyChart) OR °A paper copy in the mail °If you have any lab test that is abnormal or we need to change your treatment, we will call you to review the results. ° ° °Testing/Procedures: NONE ORDERED  TODAY ° ° °Follow-Up: °At [facility], you and your health needs are our priority.  As part of our continuing mission to provide you with exceptional heart care, we have created designated Provider Care Teams.  These Care Teams include your primary Cardiologist (physician) and Advanced Practice Providers (APPs -  Physician Assistants and Nurse Practitioners) who all work together to provide you with the care you need, when you need it. ° °We recommend signing up for the patient portal called "MyChart".  Sign up information is provided on this After Visit Summary.  MyChart is used to connect with patients for Virtual Visits (Telemedicine).  Patients are able to view lab/test results, encounter notes, upcoming appointments, etc.  Non-urgent messages can be sent to your provider as well.   °To learn more about what you can do with MyChart, go to [email or web address].   ° °Your next appointment:   °2 week(s) ° °The format for your next appointment:   °In Person ° °Provider:   °Renee Ursuy, PA-C  ° ° °Other Instructions ° °

## 2019-09-20 ENCOUNTER — Other Ambulatory Visit: Payer: Self-pay | Admitting: *Deleted

## 2019-09-20 DIAGNOSIS — Z79899 Other long term (current) drug therapy: Secondary | ICD-10-CM

## 2019-09-20 LAB — BASIC METABOLIC PANEL
BUN/Creatinine Ratio: 11 — ABNORMAL LOW (ref 12–28)
BUN: 17 mg/dL (ref 10–36)
CO2: 20 mmol/L (ref 20–29)
Calcium: 11.1 mg/dL — ABNORMAL HIGH (ref 8.7–10.3)
Chloride: 107 mmol/L — ABNORMAL HIGH (ref 96–106)
Creatinine, Ser: 1.52 mg/dL — ABNORMAL HIGH (ref 0.57–1.00)
GFR calc Af Amer: 34 mL/min/{1.73_m2} — ABNORMAL LOW (ref >59)
GFR calc non Af Amer: 30 mL/min/{1.73_m2} — ABNORMAL LOW (ref >59)
Glucose: 123 mg/dL — ABNORMAL HIGH (ref 65–99)
Potassium: 4.8 mmol/L (ref 3.5–5.2)
Sodium: 144 mmol/L (ref 134–144)

## 2019-09-20 LAB — CBC
Hematocrit: 37.6 % (ref 34.0–46.6)
Hemoglobin: 12.7 g/dL (ref 11.1–15.9)
MCH: 31.5 pg (ref 26.6–33.0)
MCHC: 33.8 g/dL (ref 31.5–35.7)
MCV: 93 fL (ref 79–97)
Platelets: 255 10*3/uL (ref 150–450)
RBC: 4.03 x10E6/uL (ref 3.77–5.28)
RDW: 13.2 % (ref 11.7–15.4)
WBC: 6.9 10*3/uL (ref 3.4–10.8)

## 2019-10-01 NOTE — Progress Notes (Signed)
Cardiology Office Note Date:  10/01/2019  Patient ID:  Tabitha, Sanders Feb 22, 1926, MRN [id number] PCP:  Lajean Manes, MD  Cardiologist:  Dr. Claiborne Billings EP: Dr. Curt Bears    Chief Complaint:   early follow up on symptoms   History of Present Illness: Tabitha Sanders is a 86 y.o. female with history of stent assisted coiling of the left cavernous carotid artery aneurysm on 05/20/15, ICA stroke 2016, HTN, AFib, PMR.    She was hospitalized 10/21/2017 after syncopal episode where she hit her ahead.  She was found to have a subarachnoid hemorrhage.  An echo was done that showed a normal EF and no PFO at the time.  She was discharged with an event monitor that showed atrial fibrillation. She has had 2 episodes of palpitations with rapid heart rates since her discharge.  1 of the episodes was marked by preventice with heart rates of 140 bpm.  Palpitations generally last approximately 30 minutes.  When she converted to sinus rhythm her heart rates were in the high 30s.  At discharge, her carvedilol dose was reduced to 12.5 mg twice a day.  She was referred to EP in the out patient setting,  She last saw Dr. Curt Bears Oct 2020 She had not had further syncope, was aware of her Afib with palpitations, associated weakness, fatigue.  Approx 1x permo, duration 39min-3hours. No changes were made, with plans if she had increased buren of her Afib with frequency and or duration may switch Multaq to amiodarone. SSSx with what appeared post conversion pauses.  Follow, she had not had further syncope.   09/19/2019 I saw her, she was happy to have the appt today, mentions she felt like she has been in AFib since 4 days prio.  She was aware of her heart beat irregular, feeling different.  No associated SOB or CP, but more fatigued then usual.  No dizziness, no near syncope or syncope. No symptoms of PND or orthopnea She said as she nears 86y/o that she is slower then she used to be, tires a little quicker, but  the fatigue differenet the last few days with her palpitations. She was very worried about this since this was the 1st time she has felt out of rhythm longer then a few hours. She could not recall the last time she had Afib prior to now. No bleeding or signs of bleeding She denied symptoms of illness, no fever, chills, cough, urinary symptoms.  She was in ST w/PACs 110bpm BP was 174/52 on arrival/on her EKG, her pulse did slow as she sat and we visited HR settled to the 90's, her BP 142/70 She seemed to be reluctant to believe that she wasnot in afib, discussed that she certainly could have converted just prior to coming in and assured her that as far as I was concerned we could trust her symptoms as her AFib. She was quite anxious about her rhythm, did labs, and planned to have her back in a couple weeks to check in.  This seemed to put her mind at ease. Her Creat a little high for her, 1. Planned to better hydrate and recheck   TODAY She has felt well, no symptoms of her AFib.  She wonders of she may have just been anxious when she thought she was in AFib.   She denies any CP, SOB. No dizzy spells, near syncope or syncope. No bleeding or signs of bleeding No symptoms of bradycardia  She has not necessarily increased  her water intake since we asked her to.  Says she has never has been good about that.    Afib Hx Diagnosed 2019  AAD Hx Multaq started July 2019 >> current  Past Medical History:  Diagnosis Date  . Anemia   . Anemia    iron deficiency anemia and pernicious  . Aneurysm, carotid artery, internal   . Benign paroxysmal positional vertigo   . Brain aneurysm 05/20/2015  . Cardiomegaly 11/17/2017  . Cerebral infarction due to occlusion of other cerebral artery ([place])   . Depression   . Essential hypertension 11/17/2017  . H/O cerebral aneurysm repair 2016  . Head trauma 11/17/2017  . HOH (hard of hearing)    wears bilateral hearing aids  . Hyperlipidemia 11/17/2017  .  Hypertension   . Intracranial aneurysm   . Paroxysmal atrial fibrillation ([place]) 12/13/2017  . Polyarthritis rheumatica ([place])   . Stroke ([place])   . Stroke [facility]) January 25, 2015   No deficits  . Subarachnoid bleed ([place]) 11/17/2017  . Syncope and collapse 11/17/2017    Past Surgical History:  Procedure Laterality Date  . ABDOMINAL HYSTERECTOMY     complete  . BREAST SURGERY     biopsy- bilateral- benign  . COLONOSCOPY WITH PROPOFOL N/A 08/20/2014   Procedure: COLONOSCOPY WITH PROPOFOL;  Surgeon: Howell Rucks, MD;  [facility];  Service: Endoscopy;  Laterality: N/A;  . EYE SURGERY     bilateral cataract with lens implant  . IR RADIOLOGIST EVAL & MGMT  12/14/2017  . RADIOLOGY WITH ANESTHESIA N/A 05/20/2015   Procedure: RADIOLOGY WITH ANESTHESIA;  Surgeon: Luanne Bras, MD;  Location: [facility];  Service: Radiology;  Laterality: N/A;    Current Outpatient Medications  Medication Sig Dispense Refill  . acetaminophen (TYLENOL) 500 MG tablet Take 500 mg by mouth every 6 (six) hours as needed for mild pain.    Marland Kitchen amLODipine (NORVASC) 10 MG tablet Take 10 mg by mouth daily.  6  . atorvastatin (LIPITOR) 40 MG tablet Take 40 mg by mouth every evening.  3  . Cyanocobalamin (VITAMIN B-12 PO) Take 1 tablet by mouth 2 (two) times a week. Take on Mondays and Wednesdays    . DULoxetine (CYMBALTA) 30 MG capsule Take 30 mg by mouth daily.  11  . ELIQUIS 5 MG TABS tablet TAKE 1 TABLET(5 MG) BY MOUTH TWICE DAILY 180 tablet 1  . ergocalciferol (VITAMIN D2) 50000 UNITS capsule Take 50,000 Units by mouth once a week. Friday    . ferrous sulfate 325 (65 FE) MG tablet Take 325 mg by mouth 3 (three) times a week. Take on Monday Wednesday Friday    . hydrALAZINE (APRESOLINE) 25 MG tablet Take 25 mg by mouth 3 (three) times daily.    . irbesartan (AVAPRO) 150 MG tablet Take 150 mg by mouth daily.    . Melatonin 5 MG TABS Take 5 mg by mouth at bedtime as needed (for sleep).    . MULTAQ 400 MG tablet TAKE 1  TABLET BY MOUTH TWICE DAILY WITH A MEAL 60 tablet 11  . Multiple Vitamins-Minerals (PRESERVISION AREDS PO) Take 1 capsule by mouth 2 (two) times daily.     No current facility-administered medications for this visit.    Allergies:   Lidocaine-epinephrine, Lisinopril, and Lisinopril   Social History:  The patient  reports that she has never smoked. She has never used smokeless tobacco. She reports that she does not drink alcohol or use drugs.   Family History:  The patient's family history includes CVA in her mother and sister; Stroke in her mother and sister.  ROS:  Please see the history of present illness. All other systems are reviewed and otherwise negative.   PHYSICAL EXAM:  VS:  There were no vitals taken for this visit. BMI: There is no height or weight on file to calculate BMI. Well nourished, well developed, in no acute distress  HEENT: normocephalic, atraumatic  Neck: no JVD, carotid bruits or masses Cardiac:  RRR; a couple extrasystoles, 1-2/6 SM, no rubs, or gallops Lungs:  CTA b/l, no wheezing, rhonchi or rales  Abd: soft, nontender MS: no deformity, age appropriateatrophy Ext: trace edema, L>R reported as chronic and unchanged by the patient  Skin: warm and dry, no rash Neuro:  No gross deficits appreciated Psych: euthymic mood, full affect   EKG:   09/19/2019: ST 110bpm, PACs, faster, otherwise no significant change from prior   11/18/2017: TTE Study Conclusions  - Left ventricle: The cavity size was normal. Wall thickness was  increased in a pattern of mild LVH. Systolic function was normal.  The estimated ejection fraction was in the range of 55% to 60%.  Wall motion was normal; there were no regional wall motion  abnormalities. Left ventricular diastolic function parameters  were normal.  - Aortic valve: There was very mild stenosis.  - Left atrium: The atrium was moderately dilated.  - Atrial septum: No defect or patent foramen ovale was identified.   - Pulmonary arteries: PA peak pressure: 31 mm Hg (S).    Recent Labs: 09/19/2019: BUN 17; Creatinine, Ser 1.52; Hemoglobin 12.7; Platelets 255; Potassium 4.8; Sodium 144  No results found for requested labs within last 8760 hours.   Estimated Creatinine Clearance: 22.6 mL/min (A) (by C-G formula based on SCr of 1.52 mg/dL (H)).   Wt Readings from Last 3 Encounters:  09/19/19 154 lb (69.9 kg)  03/23/19 160 lb 3.2 oz (72.7 kg)  02/22/18 160 lb (72.6 kg)     Other studies reviewed: Additional studies/records reviewed today include: summarized above  ASSESSMENT AND PLAN:  1. Paroxysmal Afib 2. SSSx     On Multaq      Low burden by symptoms     CHA2DS2Vasc is 7, on eliquis, appropriately dosed for weight, last Creat bordeline     Repeat BMET today, may need to adjust her Eliquis dose if >1.5, consider redution of her ARB    3. HTN     No changes     Disposition: see her back in 72mo, sooner if needed  Current medicines are reviewed at length with the patient today.  The patient did not have any concerns regarding medicines.  Venetia Night, PA-C 10/01/2019 6:18 PM     [place] [address] [phone] (office)  [phone] (fax)

## 2019-10-02 DIAGNOSIS — D510 Vitamin B12 deficiency anemia due to intrinsic factor deficiency: Secondary | ICD-10-CM | POA: Diagnosis not present

## 2019-10-02 DIAGNOSIS — M8580 Other specified disorders of bone density and structure, unspecified site: Secondary | ICD-10-CM | POA: Diagnosis not present

## 2019-10-02 DIAGNOSIS — I129 Hypertensive chronic kidney disease with stage 1 through stage 4 chronic kidney disease, or unspecified chronic kidney disease: Secondary | ICD-10-CM | POA: Diagnosis not present

## 2019-10-02 DIAGNOSIS — I480 Paroxysmal atrial fibrillation: Secondary | ICD-10-CM | POA: Diagnosis not present

## 2019-10-03 ENCOUNTER — Other Ambulatory Visit: Payer: Medicare Other

## 2019-10-03 ENCOUNTER — Ambulatory Visit: Payer: Medicare Other | Admitting: Physician Assistant

## 2019-10-03 ENCOUNTER — Other Ambulatory Visit: Payer: Self-pay | Admitting: Cardiology

## 2019-10-03 ENCOUNTER — Encounter: Payer: Self-pay | Admitting: Physician Assistant

## 2019-10-03 ENCOUNTER — Other Ambulatory Visit: Payer: Self-pay

## 2019-10-03 VITALS — BP 148/44 | HR 62 | Ht 63.0 in | Wt 154.0 lb

## 2019-10-03 DIAGNOSIS — Z79899 Other long term (current) drug therapy: Secondary | ICD-10-CM

## 2019-10-03 DIAGNOSIS — I10 Essential (primary) hypertension: Secondary | ICD-10-CM

## 2019-10-03 DIAGNOSIS — I480 Paroxysmal atrial fibrillation: Secondary | ICD-10-CM

## 2019-10-03 MED ORDER — MULTAQ 400 MG PO TABS: ORAL_TABLET | ORAL | 11 refills | Status: DC

## 2019-10-03 NOTE — Patient Instructions (Signed)
Medication Instructions:    Your physician recommends that you continue on your current medications as directed. Please refer to the Current Medication list given to you today.  *If you need a refill on your cardiac medications before your next appointment, please call your pharmacy*   Lab Work: [place]   If you have labs (blood work) drawn today and your tests are completely normal, you will receive your results only by: Marland Kitchen MyChart Message (if you have MyChart) OR . A paper copy in the mail If you have any lab test that is abnormal or we need to change your treatment, we will call you to review the results.   Testing/Procedures: NONE ORDERED  TODAY    Follow-Up: At [facility], you and your health needs are our priority.  As part of our continuing mission to provide you with exceptional heart care, we have created designated Provider Care Teams.  These Care Teams include your primary Cardiologist (physician) and Advanced Practice Providers (APPs -  Physician Assistants and Nurse Practitioners) who all work together to provide you with the care you need, when you need it.  We recommend signing up for the patient portal called "MyChart".  Sign up information is provided on this After Visit Summary.  MyChart is used to connect with patients for Virtual Visits (Telemedicine).  Patients are able to view lab/test results, encounter notes, upcoming appointments, etc.  Non-urgent messages can be sent to your provider as well.   To learn more about what you can do with MyChart, go to [email or web address].    Your next appointment:    6  Months   The format for your next appointment:   In Person  Provider:   You may see Will Meredith Leeds, MD or one of the following Advanced Practice Providers on your designated Care Team:    Chanetta Marshall, NP  Tommye Standard, PA-C  Legrand Como "Oda Kilts, [place]    Other Instructions

## 2019-10-04 LAB — BASIC METABOLIC PANEL
BUN/Creatinine Ratio: 13 (ref 12–28)
BUN: 19 mg/dL (ref 10–36)
CO2: 21 mmol/L (ref 20–29)
Calcium: 11 mg/dL — ABNORMAL HIGH (ref 8.7–10.3)
Chloride: 107 mmol/L — ABNORMAL HIGH (ref 96–106)
Creatinine, Ser: 1.45 mg/dL — ABNORMAL HIGH (ref 0.57–1.00)
GFR calc Af Amer: 36 mL/min/{1.73_m2} — ABNORMAL LOW (ref >59)
GFR calc non Af Amer: 32 mL/min/{1.73_m2} — ABNORMAL LOW (ref >59)
Glucose: 101 mg/dL — ABNORMAL HIGH (ref 65–99)
Potassium: 4.7 mmol/L (ref 3.5–5.2)
Sodium: 141 mmol/L (ref 134–144)

## 2019-10-05 ENCOUNTER — Other Ambulatory Visit: Payer: Self-pay | Admitting: Cardiology

## 2019-10-05 NOTE — Telephone Encounter (Signed)
Pt last saw Tommye Standard, [place] on 10/03/19, last labs 10/03/19 Creat 1.45, age 86, weight 69.9kg, based on specified criteria pt is on appropriate dosage of Eliquis 5mg  BID.  Will refill rx.

## 2019-10-10 ENCOUNTER — Other Ambulatory Visit: Payer: Self-pay | Admitting: *Deleted

## 2019-10-10 DIAGNOSIS — Z79899 Other long term (current) drug therapy: Secondary | ICD-10-CM

## 2019-10-10 NOTE — Progress Notes (Signed)
bmet  

## 2019-10-23 DIAGNOSIS — I480 Paroxysmal atrial fibrillation: Secondary | ICD-10-CM | POA: Diagnosis not present

## 2019-10-23 DIAGNOSIS — I129 Hypertensive chronic kidney disease with stage 1 through stage 4 chronic kidney disease, or unspecified chronic kidney disease: Secondary | ICD-10-CM | POA: Diagnosis not present

## 2019-10-23 DIAGNOSIS — M8580 Other specified disorders of bone density and structure, unspecified site: Secondary | ICD-10-CM | POA: Diagnosis not present

## 2019-10-23 DIAGNOSIS — D510 Vitamin B12 deficiency anemia due to intrinsic factor deficiency: Secondary | ICD-10-CM | POA: Diagnosis not present

## 2019-12-26 DIAGNOSIS — L304 Erythema intertrigo: Secondary | ICD-10-CM | POA: Diagnosis not present

## 2020-01-03 ENCOUNTER — Other Ambulatory Visit: Payer: Medicare Other | Admitting: *Deleted

## 2020-01-03 ENCOUNTER — Other Ambulatory Visit: Payer: Self-pay

## 2020-01-03 DIAGNOSIS — Z79899 Other long term (current) drug therapy: Secondary | ICD-10-CM

## 2020-01-03 LAB — BASIC METABOLIC PANEL
BUN/Creatinine Ratio: 13 (ref 12–28)
BUN: 18 mg/dL (ref 10–36)
CO2: 19 mmol/L — ABNORMAL LOW (ref 20–29)
Calcium: 10.6 mg/dL — ABNORMAL HIGH (ref 8.7–10.3)
Chloride: 107 mmol/L — ABNORMAL HIGH (ref 96–106)
Creatinine, Ser: 1.34 mg/dL — ABNORMAL HIGH (ref 0.57–1.00)
GFR calc Af Amer: 40 mL/min/{1.73_m2} — ABNORMAL LOW (ref >59)
GFR calc non Af Amer: 34 mL/min/{1.73_m2} — ABNORMAL LOW (ref >59)
Glucose: 130 mg/dL — ABNORMAL HIGH (ref 65–99)
Potassium: 4.3 mmol/L (ref 3.5–5.2)
Sodium: 141 mmol/L (ref 134–144)

## 2020-01-18 ENCOUNTER — Telehealth: Payer: Self-pay | Admitting: Cardiology

## 2020-01-18 NOTE — Telephone Encounter (Signed)
1. What dental office are you calling from? Dr Payton Spark  2. What is your office phone number?  [phone]  3. What is your fax number?   [phone]  4. What type of procedure is the patient having performed? Pending   5. What date is procedure scheduled or is the patient there now?  1 tooth extraction  (if the patient is at the dentist's office question goes to their cardiologist if he/she is in the office.  If not, question should go to the DOD).   6. What is your question (ex. Antibiotics prior to procedure, holding medication-we need to know how long dentist wants pt to hold med)?  Does Eliquis need to be held?

## 2020-01-18 NOTE — Telephone Encounter (Signed)
   Primary Cardiologist: Shelva Majestic, MD  Chart reviewed as part of pre-operative protocol coverage. Simple dental extractions are considered low risk procedures per guidelines and generally do not require any specific cardiac clearance. It is also generally accepted that for simple extractions and dental cleanings, there is no need to interrupt blood thinner therapy.   SBE prophylaxis is not required for the patient.  I will route this recommendation to the requesting party via Epic fax function and remove from pre-op pool.  Please call with questions.  Deberah Pelton, NP 01/18/2020, 1:10 PM

## 2020-01-22 DIAGNOSIS — F3340 Major depressive disorder, recurrent, in remission, unspecified: Secondary | ICD-10-CM | POA: Diagnosis not present

## 2020-01-22 DIAGNOSIS — D510 Vitamin B12 deficiency anemia due to intrinsic factor deficiency: Secondary | ICD-10-CM | POA: Diagnosis not present

## 2020-01-22 DIAGNOSIS — M8580 Other specified disorders of bone density and structure, unspecified site: Secondary | ICD-10-CM | POA: Diagnosis not present

## 2020-01-22 DIAGNOSIS — I129 Hypertensive chronic kidney disease with stage 1 through stage 4 chronic kidney disease, or unspecified chronic kidney disease: Secondary | ICD-10-CM | POA: Diagnosis not present

## 2020-02-21 DIAGNOSIS — H35423 Microcystoid degeneration of retina, bilateral: Secondary | ICD-10-CM | POA: Diagnosis not present

## 2020-02-21 DIAGNOSIS — H43813 Vitreous degeneration, bilateral: Secondary | ICD-10-CM | POA: Diagnosis not present

## 2020-02-21 DIAGNOSIS — H353111 Nonexudative age-related macular degeneration, right eye, early dry stage: Secondary | ICD-10-CM | POA: Diagnosis not present

## 2020-02-21 DIAGNOSIS — H353122 Nonexudative age-related macular degeneration, left eye, intermediate dry stage: Secondary | ICD-10-CM | POA: Diagnosis not present

## 2020-03-05 DIAGNOSIS — I129 Hypertensive chronic kidney disease with stage 1 through stage 4 chronic kidney disease, or unspecified chronic kidney disease: Secondary | ICD-10-CM | POA: Diagnosis not present

## 2020-03-05 DIAGNOSIS — D6869 Other thrombophilia: Secondary | ICD-10-CM | POA: Diagnosis not present

## 2020-03-05 DIAGNOSIS — N1832 Chronic kidney disease, stage 3b: Secondary | ICD-10-CM | POA: Diagnosis not present

## 2020-03-05 DIAGNOSIS — I480 Paroxysmal atrial fibrillation: Secondary | ICD-10-CM | POA: Diagnosis not present

## 2020-03-07 DIAGNOSIS — F3340 Major depressive disorder, recurrent, in remission, unspecified: Secondary | ICD-10-CM | POA: Diagnosis not present

## 2020-03-07 DIAGNOSIS — I129 Hypertensive chronic kidney disease with stage 1 through stage 4 chronic kidney disease, or unspecified chronic kidney disease: Secondary | ICD-10-CM | POA: Diagnosis not present

## 2020-03-07 DIAGNOSIS — D510 Vitamin B12 deficiency anemia due to intrinsic factor deficiency: Secondary | ICD-10-CM | POA: Diagnosis not present

## 2020-03-07 DIAGNOSIS — M8580 Other specified disorders of bone density and structure, unspecified site: Secondary | ICD-10-CM | POA: Diagnosis not present

## 2020-03-28 ENCOUNTER — Other Ambulatory Visit: Payer: Self-pay

## 2020-03-28 ENCOUNTER — Encounter: Payer: Self-pay | Admitting: Cardiology

## 2020-03-28 ENCOUNTER — Ambulatory Visit: Payer: Medicare Other | Admitting: Cardiology

## 2020-03-28 VITALS — BP 160/60 | HR 89 | Ht 63.0 in | Wt 156.0 lb

## 2020-03-28 DIAGNOSIS — I480 Paroxysmal atrial fibrillation: Secondary | ICD-10-CM

## 2020-03-28 MED ORDER — HYDRALAZINE HCL 50 MG PO TABS: 50.0000 mg | ORAL_TABLET | Freq: Three times a day (TID) | ORAL | 2 refills | Status: DC

## 2020-03-28 NOTE — Progress Notes (Signed)
Electrophysiology Office Note   Date:  03/28/2020   ID:  Tabitha Sanders, DOB [birth year], MRN [id number]  PCP:  Lajean Manes, MD  Cardiologist:  Claiborne Billings Primary Electrophysiologist:  Amalia Edgecombe Meredith Leeds, MD    No chief complaint on file.    History of Present Illness: Tabitha Sanders is a 86 y.o. female who is being seen today for the evaluation of atrial fibrillation at the request of Angie Duke. Presenting today for electrophysiology evaluation.    She has a history of hypertension, stroke, and atrial fibrillation.  She was hospitalized 10/21/2017 after syncopal episode where she hit her head.  She was found to have a subarachnoid hemorrhage.  She had an echo performed showed only her ejection fraction with no PFO.  She was discharged with an event monitor that showed atrial fibrillation.  She had 2 episodes of palpitations with rapid heart rates since her discharge.  One of the episodes showed heart rates of 140 bpm.  She was discharged on carvedilol.  She is also been started on Multaq.  Today, denies symptoms of palpitations, chest pain, shortness of breath, orthopnea, PND, lower extremity edema, claudication, dizziness, presyncope, syncope, bleeding, or neurologic sequela. The patient is tolerating medications without difficulties.  Since last being seen she has done well.  She has no further episodes of atrial fibrillation.  Her blood pressure is elevated in clinic today and has done last few checks.  She also says that her blood pressure is high at times when she is at home.  Past Medical History:  Diagnosis Date  . Anemia   . Anemia    iron deficiency anemia and pernicious  . Aneurysm, carotid artery, internal   . Benign paroxysmal positional vertigo   . Brain aneurysm 05/20/2015  . Cardiomegaly 11/17/2017  . Cerebral infarction due to occlusion of other cerebral artery ([place])   . Depression   . Essential hypertension 11/17/2017  . H/O cerebral aneurysm repair 2016  . Head  trauma 11/17/2017  . HOH (hard of hearing)    wears bilateral hearing aids  . Hyperlipidemia 11/17/2017  . Hypertension   . Intracranial aneurysm   . Paroxysmal atrial fibrillation ([place]) 12/13/2017  . Polyarthritis rheumatica ([place])   . Stroke ([place])   . Stroke [facility]) January 25, 2015   No deficits  . Subarachnoid bleed ([place]) 11/17/2017  . Syncope and collapse 11/17/2017   Past Surgical History:  Procedure Laterality Date  . ABDOMINAL HYSTERECTOMY     complete  . BREAST SURGERY     biopsy- bilateral- benign  . COLONOSCOPY WITH PROPOFOL N/A 08/20/2014   Procedure: COLONOSCOPY WITH PROPOFOL;  Surgeon: Howell Rucks, MD;  Location: [facility];  Service: Endoscopy;  Laterality: N/A;  . EYE SURGERY     bilateral cataract with lens implant  . IR RADIOLOGIST EVAL & MGMT  12/14/2017  . RADIOLOGY WITH ANESTHESIA N/A 05/20/2015   Procedure: RADIOLOGY WITH ANESTHESIA;  Surgeon: Luanne Bras, MD;  Location: [facility];  Service: Radiology;  Laterality: N/A;     Current Outpatient Medications  Medication Sig Dispense Refill  . acetaminophen (TYLENOL) 500 MG tablet Take 500 mg by mouth every 6 (six) hours as needed for mild pain.    Marland Kitchen amLODipine (NORVASC) 10 MG tablet Take 10 mg by mouth daily.  6  . atorvastatin (LIPITOR) 40 MG tablet Take 40 mg by mouth every evening.  3  . Cyanocobalamin (VITAMIN B-12 PO) Take 1 tablet by mouth 2 (two) times a week. Take  on Mondays and Wednesdays    . dronedarone (MULTAQ) 400 MG tablet TAKE 1 TABLET BY MOUTH TWICE DAILY WITH A MEAL 60 tablet 11  . DULoxetine (CYMBALTA) 30 MG capsule Take 30 mg by mouth daily.  11  . ELIQUIS 5 MG TABS tablet TAKE 1 TABLET(5 MG) BY MOUTH TWICE DAILY 180 tablet 1  . ergocalciferol (VITAMIN D2) 50000 UNITS capsule Take 50,000 Units by mouth once a week. Friday    . ferrous sulfate 325 (65 FE) MG tablet Take 325 mg by mouth 3 (three) times a week. Take on Monday Wednesday Friday    . irbesartan (AVAPRO) 150 MG tablet Take 150 mg by mouth  daily.    . Melatonin 5 MG TABS Take 5 mg by mouth at bedtime as needed (for sleep).    . Multiple Vitamins-Minerals (PRESERVISION AREDS PO) Take 1 capsule by mouth 2 (two) times daily.    . hydrALAZINE (APRESOLINE) 50 MG tablet Take 1 tablet (50 mg total) by mouth 3 (three) times daily. 270 tablet 2   No current facility-administered medications for this visit.    Allergies:   Lidocaine-epinephrine, Lisinopril, and Lisinopril   Social History:  The patient  reports that she has never smoked. She has never used smokeless tobacco. She reports that she does not drink alcohol and does not use drugs.   Family History:  The patient's family history includes CVA in her mother and sister; Stroke in her mother and sister.   ROS:  Please see the history of present illness.   Otherwise, review of systems is positive for none.   All other systems are reviewed and negative.   PHYSICAL EXAM: VS:  BP (!) 160/60   Pulse 89   Ht 5\' 3"  (1.6 m)   Wt 156 lb (70.8 kg)   SpO2 96%   BMI 27.63 kg/m  , BMI Body mass index is 27.63 kg/m. GEN: Well nourished, well developed, in no acute distress  HEENT: normal  Neck: no JVD, carotid bruits, or masses Cardiac: RRR; no murmurs, rubs, or gallops,no edema  Respiratory:  clear to auscultation bilaterally, normal work of breathing GI: soft, nontender, nondistended, + BS MS: no deformity or atrophy  Skin: warm and dry Neuro:  Strength and sensation are intact Psych: euthymic mood, full affect  EKG:  EKG is ordered today. Personal review of the ekg ordered shows sinus rhythm, rate 89, diffuse T wave inversions  Recent Labs: 09/19/2019: Hemoglobin 12.7; Platelets 255 01/03/2020: BUN 18; Creatinine, Ser 1.34; Potassium 4.3; Sodium 141    Lipid Panel     Component Value Date/Time   CHOL 186 01/26/2015 0557   TRIG 351 (H) 01/26/2015 0557   HDL 30 (L) 01/26/2015 0557   CHOLHDL 6.2 01/26/2015 0557   VLDL 70 (H) 01/26/2015 0557   LDLCALC 86 01/26/2015 0557      Wt Readings from Last 3 Encounters:  03/28/20 156 lb (70.8 kg)  10/03/19 154 lb (69.9 kg)  09/19/19 154 lb (69.9 kg)      Other studies Reviewed: Additional studies/ records that were reviewed today include: TTE 11/18/17  Review of the above records today demonstrates:  - Left ventricle: The cavity size was normal. Wall thickness was   increased in a pattern of mild LVH. Systolic function was normal.   The estimated ejection fraction was in the range of 55% to 60%.   Wall motion was normal; there were no regional wall motion   abnormalities. Left ventricular diastolic function parameters  were normal. - Aortic valve: There was very mild stenosis. - Left atrium: The atrium was moderately dilated. - Atrial septum: No defect or patent foramen ovale was identified. - Pulmonary arteries: PA peak pressure: 31 mm Hg (S).   ASSESSMENT AND PLAN:  1.  Paroxysmal atrial fibrillation: Currently on Multaq (ECG monitoring for high risk medication), and Eliquis.  CHA2DS2-VASc 4.  She remains in sinus rhythm.  No changes.  2.  Hypertension: Blood pressure is elevated today.  We Hortencia Martire increase hydralazine to 50 mg.  3.  Sick sinus syndrome: Positive appears to be postconversion.  No further episodes of atrial fibrillation or bradycardic symptoms.  No changes.  Current medicines are reviewed at length with the patient today.   The patient does not have concerns regarding her medicines.  The following changes were made today: Increase hydralazine  Labs/ tests ordered today include:  Orders Placed This Encounter  Procedures  . EKG 12-Lead     Disposition:   FU with Geralda Baumgardner 6 months  Signed, Orra Nolde Meredith Leeds, MD  03/28/2020 3:25 PM     [facility] [address] [place] [address] [phone] (office) [phone] (fax)

## 2020-03-28 NOTE — Patient Instructions (Addendum)
Medication Instructions:  Your physician has recommended you make the following change in your medication:  1. INCREASE Hydralazine to   *If you need a refill on your cardiac medications before your next appointment, please call your pharmacy*   Lab Work: None ordered  Testing/Procedures: None ordered   Follow-Up: At [facility], you and your health needs are our priority.  As part of our continuing mission to provide you with exceptional heart care, we have created designated Provider Care Teams.  These Care Teams include your primary Cardiologist (physician) and Advanced Practice Providers (APPs -  Physician Assistants and Nurse Practitioners) who all work together to provide you with the care you need, when you need it.  We recommend signing up for the patient portal called "MyChart".  Sign up information is provided on this After Visit Summary.  MyChart is used to connect with patients for Virtual Visits (Telemedicine).  Patients are able to view lab/test results, encounter notes, upcoming appointments, etc.  Non-urgent messages can be sent to your provider as well.   To learn more about what you can do with MyChart, go to [email or web address].    Your next appointment:   6 month(s)  The format for your next appointment:   In Person  Provider:   Allegra Lai, MD    Thank you for choosing [facility]!!   Trinidad Curet, RN [phone]

## 2020-04-02 ENCOUNTER — Other Ambulatory Visit: Payer: Self-pay | Admitting: Cardiology

## 2020-04-02 NOTE — Telephone Encounter (Signed)
Eliquis 5mg  refill request received. Patient is 86 years old, weight-70.8kg, Crea-1.34 on 01/03/20, Diagnosis-Afib, and last seen by Dr. Curt Bears on 03/28/2020. Dose is appropriate based on dosing criteria. Will send in refill to requested pharmacy.

## 2020-04-11 DIAGNOSIS — E213 Hyperparathyroidism, unspecified: Secondary | ICD-10-CM | POA: Diagnosis not present

## 2020-04-22 DIAGNOSIS — M8580 Other specified disorders of bone density and structure, unspecified site: Secondary | ICD-10-CM | POA: Diagnosis not present

## 2020-04-22 DIAGNOSIS — F3340 Major depressive disorder, recurrent, in remission, unspecified: Secondary | ICD-10-CM | POA: Diagnosis not present

## 2020-04-22 DIAGNOSIS — I129 Hypertensive chronic kidney disease with stage 1 through stage 4 chronic kidney disease, or unspecified chronic kidney disease: Secondary | ICD-10-CM | POA: Diagnosis not present

## 2020-04-22 DIAGNOSIS — D510 Vitamin B12 deficiency anemia due to intrinsic factor deficiency: Secondary | ICD-10-CM | POA: Diagnosis not present

## 2020-04-26 DIAGNOSIS — N390 Urinary tract infection, site not specified: Secondary | ICD-10-CM | POA: Diagnosis not present

## 2020-05-17 DIAGNOSIS — N1832 Chronic kidney disease, stage 3b: Secondary | ICD-10-CM | POA: Diagnosis not present

## 2020-05-17 DIAGNOSIS — I129 Hypertensive chronic kidney disease with stage 1 through stage 4 chronic kidney disease, or unspecified chronic kidney disease: Secondary | ICD-10-CM | POA: Diagnosis not present

## 2020-05-17 DIAGNOSIS — E213 Hyperparathyroidism, unspecified: Secondary | ICD-10-CM | POA: Diagnosis not present

## 2020-05-27 ENCOUNTER — Other Ambulatory Visit (HOSPITAL_COMMUNITY): Payer: Self-pay | Admitting: Interventional Radiology

## 2020-05-27 ENCOUNTER — Telehealth (HOSPITAL_COMMUNITY): Payer: Self-pay | Admitting: Radiology

## 2020-05-27 DIAGNOSIS — I671 Cerebral aneurysm, nonruptured: Secondary | ICD-10-CM

## 2020-05-27 NOTE — Telephone Encounter (Signed)
Called pt, left VM for her to call to schedule MRI/MRA head JM

## 2020-06-10 DIAGNOSIS — D510 Vitamin B12 deficiency anemia due to intrinsic factor deficiency: Secondary | ICD-10-CM | POA: Diagnosis not present

## 2020-06-10 DIAGNOSIS — I129 Hypertensive chronic kidney disease with stage 1 through stage 4 chronic kidney disease, or unspecified chronic kidney disease: Secondary | ICD-10-CM | POA: Diagnosis not present

## 2020-06-10 DIAGNOSIS — F3340 Major depressive disorder, recurrent, in remission, unspecified: Secondary | ICD-10-CM | POA: Diagnosis not present

## 2020-06-10 DIAGNOSIS — M8580 Other specified disorders of bone density and structure, unspecified site: Secondary | ICD-10-CM | POA: Diagnosis not present

## 2020-07-08 ENCOUNTER — Other Ambulatory Visit: Payer: Self-pay | Admitting: Internal Medicine

## 2020-07-08 DIAGNOSIS — E210 Primary hyperparathyroidism: Secondary | ICD-10-CM | POA: Diagnosis not present

## 2020-07-08 DIAGNOSIS — Z13820 Encounter for screening for osteoporosis: Secondary | ICD-10-CM

## 2020-07-30 DIAGNOSIS — E210 Primary hyperparathyroidism: Secondary | ICD-10-CM | POA: Diagnosis not present

## 2020-08-06 DIAGNOSIS — H40053 Ocular hypertension, bilateral: Secondary | ICD-10-CM | POA: Diagnosis not present

## 2020-08-06 DIAGNOSIS — H353111 Nonexudative age-related macular degeneration, right eye, early dry stage: Secondary | ICD-10-CM | POA: Diagnosis not present

## 2020-08-06 DIAGNOSIS — H353122 Nonexudative age-related macular degeneration, left eye, intermediate dry stage: Secondary | ICD-10-CM | POA: Diagnosis not present

## 2020-08-06 DIAGNOSIS — H40023 Open angle with borderline findings, high risk, bilateral: Secondary | ICD-10-CM | POA: Diagnosis not present

## 2020-08-20 DIAGNOSIS — H35423 Microcystoid degeneration of retina, bilateral: Secondary | ICD-10-CM | POA: Diagnosis not present

## 2020-08-20 DIAGNOSIS — H353111 Nonexudative age-related macular degeneration, right eye, early dry stage: Secondary | ICD-10-CM | POA: Diagnosis not present

## 2020-08-20 DIAGNOSIS — H43813 Vitreous degeneration, bilateral: Secondary | ICD-10-CM | POA: Diagnosis not present

## 2020-08-20 DIAGNOSIS — H353122 Nonexudative age-related macular degeneration, left eye, intermediate dry stage: Secondary | ICD-10-CM | POA: Diagnosis not present

## 2020-09-04 DIAGNOSIS — I129 Hypertensive chronic kidney disease with stage 1 through stage 4 chronic kidney disease, or unspecified chronic kidney disease: Secondary | ICD-10-CM | POA: Diagnosis not present

## 2020-09-04 DIAGNOSIS — D510 Vitamin B12 deficiency anemia due to intrinsic factor deficiency: Secondary | ICD-10-CM | POA: Diagnosis not present

## 2020-09-04 DIAGNOSIS — Z0000 Encounter for general adult medical examination without abnormal findings: Secondary | ICD-10-CM | POA: Diagnosis not present

## 2020-09-04 DIAGNOSIS — Z1389 Encounter for screening for other disorder: Secondary | ICD-10-CM | POA: Diagnosis not present

## 2020-09-04 DIAGNOSIS — M8580 Other specified disorders of bone density and structure, unspecified site: Secondary | ICD-10-CM | POA: Diagnosis not present

## 2020-09-04 DIAGNOSIS — E782 Mixed hyperlipidemia: Secondary | ICD-10-CM | POA: Diagnosis not present

## 2020-09-04 DIAGNOSIS — F3340 Major depressive disorder, recurrent, in remission, unspecified: Secondary | ICD-10-CM | POA: Diagnosis not present

## 2020-09-29 ENCOUNTER — Other Ambulatory Visit: Payer: Self-pay | Admitting: Physician Assistant

## 2020-09-29 ENCOUNTER — Other Ambulatory Visit: Payer: Self-pay | Admitting: Cardiology

## 2020-09-30 NOTE — Telephone Encounter (Signed)
Pt's age 87, wt 70.8 kg, SCr 1.39, CrCl 29.94, last ov w/ WC 03/28/20. Sending to pharmacy to see if pt needs dosage reduction.

## 2020-09-30 NOTE — Telephone Encounter (Signed)
Eliquis 5 mg is appropriate dosage.  Will send in refill as requested.

## 2020-10-21 DIAGNOSIS — D510 Vitamin B12 deficiency anemia due to intrinsic factor deficiency: Secondary | ICD-10-CM | POA: Diagnosis not present

## 2020-10-21 DIAGNOSIS — I129 Hypertensive chronic kidney disease with stage 1 through stage 4 chronic kidney disease, or unspecified chronic kidney disease: Secondary | ICD-10-CM | POA: Diagnosis not present

## 2020-10-21 DIAGNOSIS — F3340 Major depressive disorder, recurrent, in remission, unspecified: Secondary | ICD-10-CM | POA: Diagnosis not present

## 2020-10-21 DIAGNOSIS — M8580 Other specified disorders of bone density and structure, unspecified site: Secondary | ICD-10-CM | POA: Diagnosis not present

## 2020-11-04 ENCOUNTER — Other Ambulatory Visit: Payer: Self-pay

## 2020-11-04 ENCOUNTER — Ambulatory Visit: Payer: Medicare Other | Admitting: Cardiology

## 2020-11-04 ENCOUNTER — Encounter: Payer: Self-pay | Admitting: Cardiology

## 2020-11-04 VITALS — BP 142/38 | HR 78 | Ht 62.0 in | Wt 153.0 lb

## 2020-11-04 DIAGNOSIS — I480 Paroxysmal atrial fibrillation: Secondary | ICD-10-CM

## 2020-11-04 NOTE — Patient Instructions (Signed)
Medication Instructions:  Your physician recommends that you continue on your current medications as directed. Please refer to the Current Medication list given to you today.  *If you need a refill on your cardiac medications before your next appointment, please call your pharmacy*   Lab Work: None ordered If you have labs (blood work) drawn today and your tests are completely normal, you will receive your results only by: Marland Kitchen MyChart Message (if you have MyChart) OR . A paper copy in the mail If you have any lab test that is abnormal or we need to change your treatment, we will call you to review the results.   Testing/Procedures: None ordered   Follow-Up: At [facility], you and your health needs are our priority.  As part of our continuing mission to provide you with exceptional heart care, we have created designated Provider Care Teams.  These Care Teams include your primary Cardiologist (physician) and Advanced Practice Providers (APPs -  Physician Assistants and Nurse Practitioners) who all work together to provide you with the care you need, when you need it.  We recommend signing up for the patient portal called "MyChart".  Sign up information is provided on this After Visit Summary.  MyChart is used to connect with patients for Virtual Visits (Telemedicine).  Patients are able to view lab/test results, encounter notes, upcoming appointments, etc.  Non-urgent messages can be sent to your provider as well.   To learn more about what you can do with MyChart, go to [email or web address].    Your next appointment:   6 month(s)  The format for your next appointment:   In Person  Provider:   Allegra Lai, MD    Thank you for choosing [facility]!!   Trinidad Curet, RN [phone]   Other Instructions  [place] Patient [employer]:  Eliquis  [phone]   [employer] Patient Connections: Multaq  [phone]

## 2020-11-04 NOTE — Progress Notes (Signed)
Electrophysiology Office Note   Date:  11/05/2020   ID:  Tabitha Sanders, Tabitha Sanders 17-Feb-1926, MRN [id number]  PCP:  Lajean Manes, MD  Cardiologist:  Claiborne Billings Primary Electrophysiologist:  Juliette Standre Meredith Leeds, MD    No chief complaint on file.    History of Present Illness: Tabitha Sanders is a 87 y.o. female who is being seen today for the evaluation of atrial fibrillation at the request of Angie Duke. Presenting today for electrophysiology evaluation.    She has a history of hypertension, stroke, atrial fibrillation.  She was hospitalized 10/21/2017 after a syncopal episode where she hit her head.  She was found to have a subarachnoid hemorrhage.  She had an echo performed that showed a normal ejection fraction without a PFO.  She was discharged with a monitor showing atrial fibrillation.  She is now on Multaq.  Today, denies symptoms of palpitations, chest pain, shortness of breath, orthopnea, PND, lower extremity edema, claudication, dizziness, presyncope, syncope, bleeding, or neurologic sequela. The patient is tolerating medications without difficulties.  Since last being seen she has done well.  She has had minimal episodes of atrial fibrillation.  She continues to do all of her daily activities.  She does state that she has some fatigue, but she feels that this is appropriate for her at 87 years old.  May  Past Medical History:  Diagnosis Date  . Anemia   . Anemia    iron deficiency anemia and pernicious  . Aneurysm, carotid artery, internal   . Benign paroxysmal positional vertigo   . Brain aneurysm 05/20/2015  . Cardiomegaly 11/17/2017  . Cerebral infarction due to occlusion of other cerebral artery ([place])   . Depression   . Essential hypertension 11/17/2017  . H/O cerebral aneurysm repair 2016  . Head trauma 11/17/2017  . HOH (hard of hearing)    wears bilateral hearing aids  . Hyperlipidemia 11/17/2017  . Hypertension   . Intracranial aneurysm   . Paroxysmal atrial  fibrillation ([place]) 12/13/2017  . Polyarthritis rheumatica ([place])   . Stroke ([place])   . Stroke [facility]) January 25, 2015   No deficits  . Subarachnoid bleed ([place]) 11/17/2017  . Syncope and collapse 11/17/2017   Past Surgical History:  Procedure Laterality Date  . ABDOMINAL HYSTERECTOMY     complete  . BREAST SURGERY     biopsy- bilateral- benign  . COLONOSCOPY WITH PROPOFOL N/A 08/20/2014   Procedure: COLONOSCOPY WITH PROPOFOL;  Surgeon: Howell Rucks, MD;  [facility];  Service: Endoscopy;  Laterality: N/A;  . EYE SURGERY     bilateral cataract with lens implant  . IR RADIOLOGIST EVAL & MGMT  12/14/2017  . RADIOLOGY WITH ANESTHESIA N/A 05/20/2015   Procedure: RADIOLOGY WITH ANESTHESIA;  Surgeon: Luanne Bras, MD;  Location: [facility];  Service: Radiology;  Laterality: N/A;     Current Outpatient Medications  Medication Sig Dispense Refill  . acetaminophen (TYLENOL) 500 MG tablet Take 500 mg by mouth every 6 (six) hours as needed for mild pain.    Marland Kitchen amLODipine (NORVASC) 10 MG tablet Take 10 mg by mouth daily.  6  . atorvastatin (LIPITOR) 40 MG tablet Take 40 mg by mouth every evening.  3  . Cyanocobalamin (VITAMIN B-12 PO) Take 1 tablet by mouth 2 (two) times a week. Take on Mondays and Wednesdays    . DULoxetine (CYMBALTA) 30 MG capsule Take 30 mg by mouth daily.  11  . ELIQUIS 5 MG TABS tablet TAKE 1 TABLET(5 MG)  BY MOUTH TWICE DAILY 180 tablet 1  . ergocalciferol (VITAMIN D2) 50000 UNITS capsule Take 50,000 Units by mouth once a week. Friday    . ferrous sulfate 325 (65 FE) MG tablet Take 325 mg by mouth 3 (three) times a week. Take on Monday Wednesday Friday    . irbesartan (AVAPRO) 150 MG tablet Take 75 mg by mouth daily.    . Melatonin 5 MG TABS Take 5 mg by mouth at bedtime as needed (for sleep).    . MULTAQ 400 MG tablet TAKE 1 TABLET BY MOUTH TWICE DAILY WITH A MEAL 180 tablet 1  . Multiple Vitamins-Minerals (PRESERVISION AREDS PO) Take 1 capsule by mouth 2 (two) times daily.     . hydrALAZINE (APRESOLINE) 50 MG tablet Take 1 tablet (50 mg total) by mouth 3 (three) times daily. 270 tablet 2   No current facility-administered medications for this visit.    Allergies:   Lidocaine-epinephrine, Lisinopril, and Lisinopril   Social History:  The patient  reports that she has never smoked. She has never used smokeless tobacco. She reports that she does not drink alcohol and does not use drugs.   Family History:  The patient's family history includes CVA in her mother and sister; Stroke in her mother and sister.   ROS:  Please see the history of present illness.   Otherwise, review of systems is positive for none.   All other systems are reviewed and negative.   PHYSICAL EXAM: VS:  BP (!) 142/38   Pulse 78   Ht 5\' 2"  (1.575 m)   Wt 153 lb (69.4 kg)   BMI 27.98 kg/m  , BMI Body mass index is 27.98 kg/m. GEN: Well nourished, well developed, in no acute distress  HEENT: normal  Neck: no JVD, carotid bruits, or masses Cardiac: RRR; 2 out of 6 systolic murmur at the base, now rubs, or gallops,no edema  Respiratory:  clear to auscultation bilaterally, normal work of breathing GI: soft, nontender, nondistended, + BS MS: no deformity or atrophy  Skin: warm and dry Neuro:  Strength and sensation are intact Psych: euthymic mood, full affect  EKG:  EKG is ordered today. Personal review of the ekg ordered shows sinus rhythm, rate 78  Recent Labs: 01/03/2020: BUN 18; Creatinine, Ser 1.34; Potassium 4.3; Sodium 141    Lipid Panel     Component Value Date/Time   CHOL 186 01/26/2015 0557   TRIG 351 (H) 01/26/2015 0557   HDL 30 (L) 01/26/2015 0557   CHOLHDL 6.2 01/26/2015 0557   VLDL 70 (H) 01/26/2015 0557   LDLCALC 86 01/26/2015 0557     Wt Readings from Last 3 Encounters:  11/04/20 153 lb (69.4 kg)  03/28/20 156 lb (70.8 kg)  10/03/19 154 lb (69.9 kg)      Other studies Reviewed: Additional studies/ records that were reviewed today include: TTE 11/18/17   Review of the above records today demonstrates:  - Left ventricle: The cavity size was normal. Wall thickness was   increased in a pattern of mild LVH. Systolic function was normal.   The estimated ejection fraction was in the range of 55% to 60%.   Wall motion was normal; there were no regional wall motion   abnormalities. Left ventricular diastolic function parameters   were normal. - Aortic valve: There was very mild stenosis. - Left atrium: The atrium was moderately dilated. - Atrial septum: No defect or patent foramen ovale was identified. - Pulmonary arteries: PA peak pressure: 31  mm Hg (S).   ASSESSMENT AND PLAN:  1.  Paroxysmal atrial fibrillation: Currently on Multaq and Eliquis.  High risk medication monitoring.  CHA2DS2-VASc of 4.  She fortunately remains in sinus rhythm with minimal atrial fibrillation.  No changes.  2.  Hypertension: Currently well controlled  3.  Sick sinus syndrome: Appears to be postconversion.  No further episodes of atrial fibrillation or bradycardia.  Continue current management.  Current medicines are reviewed at length with the patient today.   The patient does not have concerns regarding her medicines.  The following changes were made today: None  Labs/ tests ordered today include:  Orders Placed This Encounter  Procedures  . EKG 12-Lead     Disposition:   FU with Renardo Cheatum 6 months  Signed, Emerie Vanderkolk Meredith Leeds, MD  11/05/2020 9:28 AM     [facility] [address] [place] [address] [phone] (office) [phone] (fax)

## 2020-11-07 ENCOUNTER — Ambulatory Visit
Admission: RE | Admit: 2020-11-07 | Discharge: 2020-11-07 | Disposition: A | Discharge status: Home / Self Care | Payer: Medicare Other | Source: Ambulatory Visit | Attending: Internal Medicine | Admitting: Internal Medicine

## 2020-11-07 ENCOUNTER — Other Ambulatory Visit: Payer: Self-pay

## 2020-11-07 DIAGNOSIS — M85831 Other specified disorders of bone density and structure, right forearm: Secondary | ICD-10-CM | POA: Diagnosis not present

## 2020-11-07 DIAGNOSIS — Z13820 Encounter for screening for osteoporosis: Secondary | ICD-10-CM

## 2020-11-07 DIAGNOSIS — Z780 Asymptomatic menopausal state: Secondary | ICD-10-CM | POA: Diagnosis not present

## 2020-11-07 DIAGNOSIS — M810 Age-related osteoporosis without current pathological fracture: Secondary | ICD-10-CM | POA: Diagnosis not present

## 2020-11-08 ENCOUNTER — Ambulatory Visit: Payer: Medicare Other | Admitting: Cardiology

## 2020-11-22 DIAGNOSIS — M810 Age-related osteoporosis without current pathological fracture: Secondary | ICD-10-CM | POA: Diagnosis not present

## 2020-12-06 DIAGNOSIS — Z23 Encounter for immunization: Secondary | ICD-10-CM | POA: Diagnosis not present

## 2020-12-09 DIAGNOSIS — F3340 Major depressive disorder, recurrent, in remission, unspecified: Secondary | ICD-10-CM | POA: Diagnosis not present

## 2020-12-09 DIAGNOSIS — I129 Hypertensive chronic kidney disease with stage 1 through stage 4 chronic kidney disease, or unspecified chronic kidney disease: Secondary | ICD-10-CM | POA: Diagnosis not present

## 2020-12-09 DIAGNOSIS — N1832 Chronic kidney disease, stage 3b: Secondary | ICD-10-CM | POA: Diagnosis not present

## 2020-12-09 DIAGNOSIS — E782 Mixed hyperlipidemia: Secondary | ICD-10-CM | POA: Diagnosis not present

## 2020-12-19 DIAGNOSIS — M810 Age-related osteoporosis without current pathological fracture: Secondary | ICD-10-CM | POA: Diagnosis not present

## 2020-12-28 ENCOUNTER — Other Ambulatory Visit: Payer: Self-pay | Admitting: Cardiology

## 2021-01-01 DIAGNOSIS — N1832 Chronic kidney disease, stage 3b: Secondary | ICD-10-CM | POA: Diagnosis not present

## 2021-01-01 DIAGNOSIS — D6869 Other thrombophilia: Secondary | ICD-10-CM | POA: Diagnosis not present

## 2021-01-01 DIAGNOSIS — I480 Paroxysmal atrial fibrillation: Secondary | ICD-10-CM | POA: Diagnosis not present

## 2021-01-01 DIAGNOSIS — I129 Hypertensive chronic kidney disease with stage 1 through stage 4 chronic kidney disease, or unspecified chronic kidney disease: Secondary | ICD-10-CM | POA: Diagnosis not present

## 2021-01-14 DIAGNOSIS — E782 Mixed hyperlipidemia: Secondary | ICD-10-CM | POA: Diagnosis not present

## 2021-01-14 DIAGNOSIS — I129 Hypertensive chronic kidney disease with stage 1 through stage 4 chronic kidney disease, or unspecified chronic kidney disease: Secondary | ICD-10-CM | POA: Diagnosis not present

## 2021-01-14 DIAGNOSIS — N1832 Chronic kidney disease, stage 3b: Secondary | ICD-10-CM | POA: Diagnosis not present

## 2021-01-14 DIAGNOSIS — M8580 Other specified disorders of bone density and structure, unspecified site: Secondary | ICD-10-CM | POA: Diagnosis not present

## 2021-01-20 DIAGNOSIS — M810 Age-related osteoporosis without current pathological fracture: Secondary | ICD-10-CM | POA: Diagnosis not present

## 2021-02-25 DIAGNOSIS — H353122 Nonexudative age-related macular degeneration, left eye, intermediate dry stage: Secondary | ICD-10-CM | POA: Diagnosis not present

## 2021-03-25 DIAGNOSIS — Z23 Encounter for immunization: Secondary | ICD-10-CM | POA: Diagnosis not present

## 2021-03-25 DIAGNOSIS — M810 Age-related osteoporosis without current pathological fracture: Secondary | ICD-10-CM | POA: Diagnosis not present

## 2021-03-28 ENCOUNTER — Other Ambulatory Visit: Payer: Self-pay | Admitting: Cardiology

## 2021-03-28 NOTE — Telephone Encounter (Signed)
Prescription refill request for Eliquis received. Indication: afib  Last office visit: [place], 11/04/2020 Scr: 1.3, Via [facility], 01/01/2021 Age: 87 yo  Weight: 69.4 kg   Refill sent.

## 2021-04-09 ENCOUNTER — Other Ambulatory Visit: Payer: Self-pay | Admitting: Cardiology

## 2021-04-09 DIAGNOSIS — H353111 Nonexudative age-related macular degeneration, right eye, early dry stage: Secondary | ICD-10-CM | POA: Diagnosis not present

## 2021-04-09 DIAGNOSIS — H35033 Hypertensive retinopathy, bilateral: Secondary | ICD-10-CM | POA: Diagnosis not present

## 2021-04-09 DIAGNOSIS — H524 Presbyopia: Secondary | ICD-10-CM | POA: Diagnosis not present

## 2021-04-09 DIAGNOSIS — H40023 Open angle with borderline findings, high risk, bilateral: Secondary | ICD-10-CM | POA: Diagnosis not present

## 2021-04-09 DIAGNOSIS — H353122 Nonexudative age-related macular degeneration, left eye, intermediate dry stage: Secondary | ICD-10-CM | POA: Diagnosis not present

## 2021-04-10 ENCOUNTER — Other Ambulatory Visit: Payer: Self-pay | Admitting: Cardiology

## 2021-04-29 DIAGNOSIS — N1832 Chronic kidney disease, stage 3b: Secondary | ICD-10-CM | POA: Diagnosis not present

## 2021-04-29 DIAGNOSIS — M8580 Other specified disorders of bone density and structure, unspecified site: Secondary | ICD-10-CM | POA: Diagnosis not present

## 2021-04-29 DIAGNOSIS — E782 Mixed hyperlipidemia: Secondary | ICD-10-CM | POA: Diagnosis not present

## 2021-04-29 DIAGNOSIS — I129 Hypertensive chronic kidney disease with stage 1 through stage 4 chronic kidney disease, or unspecified chronic kidney disease: Secondary | ICD-10-CM | POA: Diagnosis not present

## 2021-05-07 ENCOUNTER — Other Ambulatory Visit: Payer: Self-pay

## 2021-05-07 ENCOUNTER — Ambulatory Visit: Payer: Medicare Other | Admitting: Cardiology

## 2021-05-07 ENCOUNTER — Encounter: Payer: Self-pay | Admitting: Cardiology

## 2021-05-07 VITALS — BP 164/64 | HR 71 | Ht 62.0 in | Wt 151.2 lb

## 2021-05-07 DIAGNOSIS — I480 Paroxysmal atrial fibrillation: Secondary | ICD-10-CM | POA: Diagnosis not present

## 2021-05-07 NOTE — Progress Notes (Signed)
Electrophysiology Office Note   Date:  05/07/2021   ID:  Tabitha, Sanders [birth year], MRN [id number]  PCP:  Tabitha Manes, MD  Cardiologist:  Tabitha Sanders Primary Electrophysiologist:  Tabitha Abdulla Meredith Leeds, MD    No chief complaint on file.    History of Present Illness: Tabitha Sanders is a 87 y.o. female who is being seen today for the evaluation of atrial fibrillation at the request of Tabitha Sanders. Presenting today for electrophysiology evaluation.    She has a history significant for hypertension, stroke, atrial fibrillation.  She was hospitalized 10/21/2017 after a syncopal episode where she hit her head and was found to have a subarachnoid hemorrhage.  Echo showed a normal ejection fraction without PFO.  She was discharged with a monitor showing atrial fibrillation.  She is now on Multaq.  She has had 1 episode of atrial fibrillation since last being seen and lasted up to 2 hours.  She otherwise feels well and is able to do all her daily activities.  Her blood pressure is elevated today, though she wishes to have it adjusted by her primary physician.  Today, denies symptoms of palpitations, chest pain, shortness of breath, orthopnea, PND, lower extremity edema, claudication, dizziness, presyncope, syncope, bleeding, or neurologic sequela. The patient is tolerating medications without difficulties.    Past Medical History:  Diagnosis Date   Anemia    Anemia    iron deficiency anemia and pernicious   Aneurysm, carotid artery, internal    Benign paroxysmal positional vertigo    Brain aneurysm 05/20/2015   Cardiomegaly 11/17/2017   Cerebral infarction due to occlusion of other cerebral artery ([place])    Depression    Essential hypertension 11/17/2017   H/O cerebral aneurysm repair 2016   Head trauma 11/17/2017   HOH (hard of hearing)    wears bilateral hearing aids   Hyperlipidemia 11/17/2017   Hypertension    Intracranial aneurysm    Paroxysmal atrial fibrillation ([place]) 12/13/2017    Polyarthritis rheumatica ([place])    Stroke [facility])    Stroke [facility]) January 25, 2015   No deficits   Subarachnoid bleed ([place]) 11/17/2017   Syncope and collapse 11/17/2017   Past Surgical History:  Procedure Laterality Date   ABDOMINAL HYSTERECTOMY     complete   BREAST SURGERY     biopsy- bilateral- benign   COLONOSCOPY WITH PROPOFOL N/A 08/20/2014   Procedure: COLONOSCOPY WITH PROPOFOL;  Surgeon: Howell Rucks, MD;  [facility]: [facility];  Service: Endoscopy;  Laterality: N/A;   EYE SURGERY     bilateral cataract with lens implant   IR RADIOLOGIST EVAL & MGMT  12/14/2017   RADIOLOGY WITH ANESTHESIA N/A 05/20/2015   Procedure: RADIOLOGY WITH ANESTHESIA;  Surgeon: Luanne Bras, MD;  Location: [place];  Service: Radiology;  Laterality: N/A;     Current Outpatient Medications  Medication Sig Dispense Refill   acetaminophen (TYLENOL) 500 MG tablet Take 500 mg by mouth every 6 (six) hours as needed for mild pain.     alendronate (FOSAMAX) 70 MG tablet Take 70 mg by mouth once a week.     amLODipine (NORVASC) 10 MG tablet Take 10 mg by mouth daily.  6   atorvastatin (LIPITOR) 40 MG tablet Take 40 mg by mouth every evening.  3   Cyanocobalamin (VITAMIN B-12 PO) Take 1 tablet by mouth 2 (two) times a week. Take on Mondays and Wednesdays     dronedarone (MULTAQ) 400 MG tablet TAKE 1 TABLET BY MOUTH TWICE DAILY  WITH A MEAL. PLEASE KEEP UPCOMING APPOINTMENT IN NOVEMBER 2022 FOR FUTURE REFILLS. THANK YOU. 120 tablet 0   DULoxetine (CYMBALTA) 30 MG capsule Take 30 mg by mouth daily.  11   ELIQUIS 5 MG TABS tablet TAKE 1 TABLET(5 MG) BY MOUTH TWICE DAILY 180 tablet 1   ergocalciferol (VITAMIN D2) 50000 UNITS capsule Take 50,000 Units by mouth once a week. Friday     ferrous sulfate 325 (65 FE) MG tablet Take 325 mg by mouth 3 (three) times a week. Take on Monday Wednesday Friday     hydrALAZINE (APRESOLINE) 50 MG tablet TAKE 1 TABLET(50 MG) BY MOUTH THREE TIMES DAILY 270 tablet 2   irbesartan (AVAPRO) 150  MG tablet Take 75 mg by mouth daily.     Melatonin 5 MG TABS Take 5 mg by mouth at bedtime as needed (for sleep).     Multiple Vitamins-Minerals (PRESERVISION AREDS PO) Take 1 capsule by mouth 2 (two) times daily.     No current facility-administered medications for this visit.    Allergies:   Codeine, Lidocaine-epinephrine, Lisinopril, and Lisinopril   Social History:  The patient  reports that she has never smoked. She has never used smokeless tobacco. She reports that she does not drink alcohol and does not use drugs.   Family History:  The patient's family history includes CVA in her mother and sister; Stroke in her mother and sister.   ROS:  Please see the history of present illness.   Otherwise, review of systems is positive for none.   All other systems are reviewed and negative.   PHYSICAL EXAM: VS:  BP (!) 164/64   Pulse 71   Ht 5\' 2"  (1.575 m)   Wt 151 lb 3.2 oz (68.6 kg)   SpO2 97%   BMI 27.65 kg/m  , BMI Body mass index is 27.65 kg/m. GEN: Well nourished, well developed, in no acute distress  HEENT: normal  Neck: no JVD, carotid bruits, or masses Cardiac: RRR; no murmurs, rubs, or gallops,no edema  Respiratory:  clear to auscultation bilaterally, normal work of breathing GI: soft, nontender, nondistended, + BS MS: no deformity or atrophy  Skin: warm and dry Neuro:  Strength and sensation are intact Psych: euthymic mood, full affect  EKG:  EKG is ordered today. Personal review of the ekg ordered shows sinus rhythm, lateral T wave inversions  Recent Labs: No results found for requested labs within last 8760 hours.    Lipid Panel     Component Value Date/Time   CHOL 186 01/26/2015 0557   TRIG 351 (H) 01/26/2015 0557   HDL 30 (L) 01/26/2015 0557   CHOLHDL 6.2 01/26/2015 0557   VLDL 70 (H) 01/26/2015 0557   LDLCALC 86 01/26/2015 0557     Wt Readings from Last 3 Encounters:  05/07/21 151 lb 3.2 oz (68.6 kg)  11/04/20 153 lb (69.4 kg)  03/28/20 156 lb  (70.8 kg)      Other studies Reviewed: Additional studies/ records that were reviewed today include: TTE 11/18/17  Review of the above records today demonstrates:  - Left ventricle: The cavity size was normal. Wall thickness was   increased in a pattern of mild LVH. Systolic function was normal.   The estimated ejection fraction was in the range of 55% to 60%.   Wall motion was normal; there were no regional wall motion   abnormalities. Left ventricular diastolic function parameters   were normal. - Aortic valve: There was very mild stenosis. -  Left atrium: The atrium was moderately dilated. - Atrial septum: No defect or patent foramen ovale was identified. - Pulmonary arteries: PA peak pressure: 31 mm Hg (S).   ASSESSMENT AND PLAN:  1.  Paroxysmal atrial fibrillation: Currently on Multaq and Eliquis.  High risk medication monitoring for about tach via ECG.  CHA2DS2-VASc of 4.  Fortunately she remains in sinus rhythm with only a 2-hour episode of atrial fibrillation.  She is overall happy with her control.  No changes.  2.  Hypertension: She states that her blood pressures have been elevated in the past.  Generally around 914 systolic.  She would like this to be further managed by her primary physician.  3.  Sick sinus syndrome: Occurred post cardioversion.  No further episodes.  We Peggyann Zwiefelhofer continue with current management.  Current medicines are reviewed at length with the patient today.   The patient does not have concerns regarding her medicines.  The following changes were made today: None  Labs/ tests ordered today include:  Orders Placed This Encounter  Procedures   EKG 12-Lead      Disposition:   FU with Yaretzy Olazabal 87months  Signed, Amarian Botero Meredith Leeds, MD  05/07/2021 12:28 PM     [facility] [address] [place] [address] [phone] (office) [phone] (fax)

## 2021-05-28 ENCOUNTER — Other Ambulatory Visit: Payer: Self-pay | Admitting: Physician Assistant

## 2021-06-04 ENCOUNTER — Other Ambulatory Visit: Payer: Self-pay

## 2021-06-04 ENCOUNTER — Ambulatory Visit
Admission: RE | Admit: 2021-06-04 | Discharge: 2021-06-04 | Disposition: A | Discharge status: Home / Self Care | Payer: Medicare Other | Source: Ambulatory Visit | Attending: Internal Medicine | Admitting: Internal Medicine

## 2021-06-04 ENCOUNTER — Other Ambulatory Visit: Payer: Self-pay | Admitting: Internal Medicine

## 2021-06-04 DIAGNOSIS — R42 Dizziness and giddiness: Secondary | ICD-10-CM | POA: Diagnosis not present

## 2021-06-04 DIAGNOSIS — R269 Unspecified abnormalities of gait and mobility: Secondary | ICD-10-CM | POA: Diagnosis not present

## 2021-09-05 DIAGNOSIS — E782 Mixed hyperlipidemia: Secondary | ICD-10-CM | POA: Diagnosis not present

## 2021-09-05 DIAGNOSIS — I480 Paroxysmal atrial fibrillation: Secondary | ICD-10-CM | POA: Diagnosis not present

## 2021-09-05 DIAGNOSIS — I129 Hypertensive chronic kidney disease with stage 1 through stage 4 chronic kidney disease, or unspecified chronic kidney disease: Secondary | ICD-10-CM | POA: Diagnosis not present

## 2021-09-10 DIAGNOSIS — N1832 Chronic kidney disease, stage 3b: Secondary | ICD-10-CM | POA: Diagnosis not present

## 2021-09-10 DIAGNOSIS — I129 Hypertensive chronic kidney disease with stage 1 through stage 4 chronic kidney disease, or unspecified chronic kidney disease: Secondary | ICD-10-CM | POA: Diagnosis not present

## 2021-09-10 DIAGNOSIS — I480 Paroxysmal atrial fibrillation: Secondary | ICD-10-CM | POA: Diagnosis not present

## 2021-09-10 DIAGNOSIS — D510 Vitamin B12 deficiency anemia due to intrinsic factor deficiency: Secondary | ICD-10-CM | POA: Diagnosis not present

## 2021-09-10 DIAGNOSIS — E782 Mixed hyperlipidemia: Secondary | ICD-10-CM | POA: Diagnosis not present

## 2021-09-10 DIAGNOSIS — Z79899 Other long term (current) drug therapy: Secondary | ICD-10-CM | POA: Diagnosis not present

## 2021-09-10 DIAGNOSIS — Z0000 Encounter for general adult medical examination without abnormal findings: Secondary | ICD-10-CM | POA: Diagnosis not present

## 2021-10-16 ENCOUNTER — Other Ambulatory Visit: Payer: Self-pay | Admitting: Cardiology

## 2021-10-16 ENCOUNTER — Ambulatory Visit: Payer: Medicare Other | Admitting: Cardiology

## 2021-10-16 ENCOUNTER — Encounter: Payer: Self-pay | Admitting: Cardiology

## 2021-10-16 VITALS — BP 150/72 | HR 81 | Ht 62.0 in | Wt 146.0 lb

## 2021-10-16 DIAGNOSIS — I480 Paroxysmal atrial fibrillation: Secondary | ICD-10-CM

## 2021-10-16 DIAGNOSIS — Z79899 Other long term (current) drug therapy: Secondary | ICD-10-CM

## 2021-10-16 DIAGNOSIS — D6869 Other thrombophilia: Secondary | ICD-10-CM

## 2021-10-16 NOTE — Telephone Encounter (Signed)
Eliquis '5mg'$  refill request received. Patient is 88 years old, weight-68.6kg, Crea-1.55 on 09/04/2021 via [facility] from [place], [place], and last seen by Dr. Curt Bears on 05/07/2021. Dose is inappropriate based on dosing criteria.  ? ?Will consult with PharmD regarding dose versus having labs redrawn.  ?

## 2021-10-16 NOTE — Patient Instructions (Signed)
Medication Instructions:  ?Your physician recommends that you continue on your current medications as directed. Please refer to the Current Medication list given to you today. ? ?*If you need a refill on your cardiac medications before your next appointment, please call your pharmacy* ? ? ?Lab Work: ?Today: BMET & CBC ?If you have labs (blood work) drawn today and your tests are completely normal, you will receive your results only by: ?MyChart Message (if you have MyChart) OR ?A paper copy in the mail ?If you have any lab test that is abnormal or we need to change your treatment, we will call you to review the results. ? ? ?Testing/Procedures: ?None ordered ? ? ?Follow-Up: ?At [facility], you and your health needs are our priority.  As part of our continuing mission to provide you with exceptional heart care, we have created designated Provider Care Teams.  These Care Teams include your primary Cardiologist (physician) and Advanced Practice Providers (APPs -  Physician Assistants and Nurse Practitioners) who all work together to provide you with the care you need, when you need it. ? ?We recommend signing up for the patient portal called "MyChart".  Sign up information is provided on this After Visit Summary.  MyChart is used to connect with patients for Virtual Visits (Telemedicine).  Patients are able to view lab/test results, encounter notes, upcoming appointments, etc.  Non-urgent messages can be sent to your provider as well.   ?To learn more about what you can do with MyChart, go to [email or web address].   ? ?Your next appointment:   ?6 month(s) ? ?The format for your next appointment:   ?In Person ? ?Provider:   ?You will see one of the following Advanced Practice Providers on your designated Care Team:   ?Tommye Standard, PA-C ?Legrand [place] "Jonni Sanger" [place], PA-C ?  ? ? ?Thank you for choosing [facility]!! ? ? ?Trinidad Curet, RN ?[phone] ? ?Other Instructions ? ?Important Information About  [place] ? ? ? ? ? ? ? ? ?  ?

## 2021-10-16 NOTE — Progress Notes (Signed)
? ?Electrophysiology Office Note ? ? ?Date:  10/16/2021  ? ?ID:  KONA YUSUF, DOB [birth year], MRN [id number] ? ?PCP:  Lajean Manes, MD  ?Cardiologist:  Claiborne Billings ?Primary Electrophysiologist:  Tennis Mckinnon Meredith Leeds, MD   ? ?No chief complaint on file. ? ? ?  ?History of Present Illness: ?Tabitha Sanders is a 88 y.o. female who is being seen today for the evaluation of atrial fibrillation at the request of Angie Duke. Presenting today for electrophysiology evaluation.   ? ?She has a history significant hypertension, CVA, atrial fibrillation.  She was hospitalized 10/21/2017 after syncopal episode where she hit her head and was found to have a subarachnoid hemorrhage.  Echo showed normal ejection fraction without PFO.  She was discharged with a monitor showing atrial fibrillation.  She was started on Multaq.  ? ?Today, denies symptoms of palpitations, chest pain, shortness of breath, orthopnea, PND, lower extremity edema, claudication, dizziness, presyncope, syncope, bleeding, or neurologic sequela. The patient is tolerating medications without difficulties.  Since being seen she has done well.  She has no chest pain or shortness of breath.  She Alexx Giambra do all of her daily activities.  She is doing quite well for a 88 year old woman.  She is unaware of further episodes of atrial fibrillation. ? ? ?Past Medical History:  ?Diagnosis Date  ? Anemia   ? Anemia   ? iron deficiency anemia and pernicious  ? Aneurysm, carotid artery, internal   ? Benign paroxysmal positional vertigo   ? Brain aneurysm 05/20/2015  ? Cardiomegaly 11/17/2017  ? Cerebral infarction due to occlusion of other cerebral artery ([place])   ? Depression   ? Essential hypertension 11/17/2017  ? H/O cerebral aneurysm repair 2016  ? Head trauma 11/17/2017  ? HOH (hard of hearing)   ? wears bilateral hearing aids  ? Hyperlipidemia 11/17/2017  ? Hypertension   ? Intracranial aneurysm   ? Paroxysmal atrial fibrillation ([place]) 12/13/2017  ? Polyarthritis rheumatica ([facility])   ?  Stroke [facility])   ? Stroke [facility]) January 25, 2015  ? No deficits  ? Subarachnoid bleed ([place]) 11/17/2017  ? Syncope and collapse 11/17/2017  ? ?Past Surgical History:  ?Procedure Laterality Date  ? ABDOMINAL HYSTERECTOMY    ? complete  ? BREAST SURGERY    ? biopsy- bilateral- benign  ? COLONOSCOPY WITH PROPOFOL N/A 08/20/2014  ? Procedure: COLONOSCOPY WITH PROPOFOL;  Surgeon: Howell Rucks, MD;  [facility];  Service: Endoscopy;  Laterality: N/A;  ? EYE SURGERY    ? bilateral cataract with lens implant  ? IR RADIOLOGIST EVAL & MGMT  12/14/2017  ? RADIOLOGY WITH ANESTHESIA N/A 05/20/2015  ? Procedure: RADIOLOGY WITH ANESTHESIA;  Surgeon: Luanne Bras, MD;  Location: [place];  Service: Radiology;  Laterality: N/A;  ? ? ? ?Current Outpatient Medications  ?Medication Sig Dispense Refill  ? acetaminophen (TYLENOL) 500 MG tablet Take 500 mg by mouth every 6 (six) hours as needed for mild pain.    ? alendronate (FOSAMAX) 70 MG tablet Take 70 mg by mouth once a week.    ? amLODipine (NORVASC) 10 MG tablet Take 10 mg by mouth daily.  6  ? atorvastatin (LIPITOR) 40 MG tablet Take 40 mg by mouth every evening.  3  ? Cyanocobalamin (VITAMIN B-12 PO) Take 1 tablet by mouth 2 (two) times a week. Take on Mondays and Wednesdays    ? dronedarone (MULTAQ) 400 MG tablet TAKE 1 TABLET BY MOUTH TWICE DAILY WITH A MEAL 180 tablet 3  ?  DULoxetine (CYMBALTA) 30 MG capsule Take 30 mg by mouth daily.  11  ? ELIQUIS 5 MG TABS tablet TAKE 1 TABLET(5 MG) BY MOUTH TWICE DAILY 180 tablet 1  ? ergocalciferol (VITAMIN D2) 50000 UNITS capsule Take 50,000 Units by mouth once a week. Friday    ? ferrous sulfate 325 (65 FE) MG tablet Take 325 mg by mouth 3 (three) times a week. Take on Monday Wednesday Friday    ? hydrALAZINE (APRESOLINE) 50 MG tablet TAKE 1 TABLET(50 MG) BY MOUTH THREE TIMES DAILY (Patient taking differently: Pt sometimes take twice a day) 270 tablet 2  ? irbesartan (AVAPRO) 150 MG tablet Take 75 mg by mouth daily.    ? Melatonin 5 MG  TABS Take 5 mg by mouth at bedtime as needed (for sleep).    ? Multiple Vitamins-Minerals (PRESERVISION AREDS PO) Take 1 capsule by mouth 2 (two) times daily.    ? ?No current facility-administered medications for this visit.  ? ? ?Allergies:   Codeine, Lidocaine-epinephrine, Lisinopril, and Lisinopril  ? ?Social History:  The patient  reports that she has never smoked. She has never used smokeless tobacco. She reports that she does not drink alcohol and does not use drugs.  ? ?Family History:  The patient's family history includes CVA in her mother and sister; Stroke in her mother and sister.  ? ?ROS:  Please see the history of present illness.   Otherwise, review of systems is positive for none.   All other systems are reviewed and negative.  ? ?PHYSICAL EXAM: ?VS:  BP (!) 150/72   Pulse 81   Ht '5\' 2"'$  (1.575 m)   Wt 146 lb (66.2 kg)   SpO2 95%   BMI 26.70 kg/m?  , BMI Body mass index is 26.7 kg/m?. ?GEN: Well nourished, well developed, in no acute distress  ?HEENT: normal  ?Neck: no JVD, carotid bruits, or masses ?Cardiac: RRR; no murmurs, rubs, or gallops,no edema  ?Respiratory:  clear to auscultation bilaterally, normal work of breathing ?GI: soft, nontender, nondistended, + BS ?MS: no deformity or atrophy  ?Skin: warm and dry ?Neuro:  Strength and sensation are intact ?Psych: euthymic mood, full affect ? ?EKG:  EKG is ordered today. ?Personal review of the ekg ordered shows rhythm, rate 81, PAC ? ?Recent Labs: ?No results found for requested labs within last 8760 hours.  ? ? ?Lipid Panel  ?   ?Component Value Date/Time  ? CHOL 186 01/26/2015 0557  ? TRIG 351 (H) 01/26/2015 0557  ? HDL 30 (L) 01/26/2015 0557  ? CHOLHDL 6.2 01/26/2015 0557  ? VLDL 70 (H) 01/26/2015 0557  ? [place] 86 01/26/2015 0557  ? ? ? ?Wt Readings from Last 3 Encounters:  ?10/16/21 146 lb (66.2 kg)  ?05/07/21 151 lb 3.2 oz (68.6 kg)  ?11/04/20 153 lb (69.4 kg)  ?  ? ? ?Other studies Reviewed: ?Additional studies/ records that were  reviewed today include: TTE 11/18/17  ?Review of the above records today demonstrates:  ?- Left ventricle: The cavity size was normal. Wall thickness was ?  increased in a pattern of mild LVH. Systolic function was normal. ?  The estimated ejection fraction was in the range of 55% to 60%. ?  Wall motion was normal; there were no regional wall motion ?  abnormalities. Left ventricular diastolic function parameters ?  were normal. ?- Aortic valve: There was very mild stenosis. ?- Left atrium: The atrium was moderately dilated. ?- Atrial septum: No defect or patent foramen  ovale was identified. ?- Pulmonary arteries: PA peak pressure: 31 mm Hg (S). ? ? ?ASSESSMENT AND PLAN: ? ?1.  Paroxysmal atrial fibrillation: Currently on Multaq 400 mg twice daily, Eliquis 5 mg twice daily.  CHA2DS2-VASc of 4.  She remains in sinus rhythm.  She is overall happy with her control. ? ?2.  Hypertension: Mildly elevated today.  It is usually in the high 130s.  We Kayson Bullis allow for further therapy per her primary physician. ? ?3.  Sick sinus syndrome: Occurred post cardioversion.  No further episodes.  Continue with current management. ? ?4.  Secondary hypercoagulable state: Currently on Eliquis for atrial fibrillation as above ? ?Current medicines are reviewed at length with the patient today.   ?The patient does not have concerns regarding her medicines.  The following changes were made today: None ? ?Labs/ tests ordered today include:  ?Orders Placed This Encounter  ?Procedures  ? Basic metabolic panel  ? CBC  ? EKG 12-Lead  ? ? ? ? ?Disposition:   FU 6 months ? ?Signed, ?Wilber Fini Meredith Leeds, MD  ?10/16/2021 11:52 AM    ? ?[facility] ?[address] ?[address] ?[place] [address] ?[phone] (office) ?[phone] (fax) ?

## 2021-10-17 LAB — CBC
Hematocrit: 32.4 % — ABNORMAL LOW (ref 34.0–46.6)
Hemoglobin: 11.3 g/dL (ref 11.1–15.9)
MCH: 30.6 pg (ref 26.6–33.0)
MCHC: 34.9 g/dL (ref 31.5–35.7)
MCV: 88 fL (ref 79–97)
Platelets: 307 10*3/uL (ref 150–450)
RBC: 3.69 x10E6/uL — ABNORMAL LOW (ref 3.77–5.28)
RDW: 13.9 % (ref 11.7–15.4)
WBC: 8.5 10*3/uL (ref 3.4–10.8)

## 2021-10-17 LAB — BASIC METABOLIC PANEL WITH GFR
BUN/Creatinine Ratio: 11 — ABNORMAL LOW (ref 12–28)
BUN: 16 mg/dL (ref 10–36)
CO2: 18 mmol/L — ABNORMAL LOW (ref 20–29)
Calcium: 10.5 mg/dL — ABNORMAL HIGH (ref 8.7–10.3)
Chloride: 109 mmol/L — ABNORMAL HIGH (ref 96–106)
Creatinine, Ser: 1.48 mg/dL — ABNORMAL HIGH (ref 0.57–1.00)
Glucose: 111 mg/dL — ABNORMAL HIGH (ref 70–99)
Potassium: 4.6 mmol/L (ref 3.5–5.2)
Sodium: 143 mmol/L (ref 134–144)
eGFR: 33 mL/min/{1.73_m2} — ABNORMAL LOW

## 2021-10-17 NOTE — Telephone Encounter (Signed)
Prescription refill request for Eliquis received. ?Indication: Afib  ?Last office visit: 10/16/21 ([place])  ?Scr: 1.48 (10/16/21) ?Age: 88 ?Weight: 66.5kg ? ? ?Per Dr Curt Bears note on 10/16/21 " Eliquis 5 mg twice daily." Pt is on appropriate dose. Also confirmed with Marcelle Overlie, Pharm D that pt should remain on '5mg'$  BID at this time. Appropriate dose and refill sent to requested pharmacy.  ? ? ? ?

## 2021-11-12 DIAGNOSIS — I480 Paroxysmal atrial fibrillation: Secondary | ICD-10-CM | POA: Diagnosis not present

## 2021-11-12 DIAGNOSIS — N1832 Chronic kidney disease, stage 3b: Secondary | ICD-10-CM | POA: Diagnosis not present

## 2021-11-12 DIAGNOSIS — I129 Hypertensive chronic kidney disease with stage 1 through stage 4 chronic kidney disease, or unspecified chronic kidney disease: Secondary | ICD-10-CM | POA: Diagnosis not present

## 2021-11-12 DIAGNOSIS — D510 Vitamin B12 deficiency anemia due to intrinsic factor deficiency: Secondary | ICD-10-CM | POA: Diagnosis not present

## 2021-12-29 NOTE — Therapy (Unsigned)
OUTPATIENT PHYSICAL THERAPY LOWER EXTREMITY EVALUATION   Patient Name: Tabitha Sanders MRN: [id number] DOB:[birth year], 88 y.o., female Today's Date: 12/30/2021    Past Medical History:  Diagnosis Date   Anemia    Anemia    iron deficiency anemia and pernicious   Aneurysm, carotid artery, internal    Benign paroxysmal positional vertigo    Brain aneurysm 05/20/2015   Cardiomegaly 11/17/2017   Cerebral infarction due to occlusion of other cerebral artery ([place])    Depression    Essential hypertension 11/17/2017   H/O cerebral aneurysm repair 2016   Head trauma 11/17/2017   HOH (hard of hearing)    wears bilateral hearing aids   Hyperlipidemia 11/17/2017   Hypertension    Intracranial aneurysm    Paroxysmal atrial fibrillation ([place]) 12/13/2017   Polyarthritis rheumatica ([place])    Stroke [facility])    Stroke [facility]) January 25, 2015   No deficits   Subarachnoid bleed ([place]) 11/17/2017   Syncope and collapse 11/17/2017   Past Surgical History:  Procedure Laterality Date   ABDOMINAL HYSTERECTOMY     complete   BREAST SURGERY     biopsy- bilateral- benign   COLONOSCOPY WITH PROPOFOL N/A 08/20/2014   Procedure: COLONOSCOPY WITH PROPOFOL;  Surgeon: Howell Rucks, MD;  Location: [facility];  Service: Endoscopy;  Laterality: N/A;   EYE SURGERY     bilateral cataract with lens implant   IR RADIOLOGIST EVAL & MGMT  12/14/2017   RADIOLOGY WITH ANESTHESIA N/A 05/20/2015   Procedure: RADIOLOGY WITH ANESTHESIA;  Surgeon: Luanne Bras, MD;  Location: [place];  Service: Radiology;  Laterality: N/A;   Patient Active Problem List   Diagnosis Date Noted   Paroxysmal atrial fibrillation ([place]) 12/13/2017   Subarachnoid bleed ([place]) 11/17/2017   Head trauma 11/17/2017   Essential hypertension 11/17/2017   Syncope and collapse 11/17/2017   Hyperlipidemia 11/17/2017   Cardiomegaly 11/17/2017   Brain aneurysm 05/20/2015   Intracranial aneurysm    Aneurysm, carotid artery, internal    Cerebral infarction due  to occlusion of other cerebral artery ([facility])    Benign paroxysmal positional vertigo    Stroke ([place]) 01/25/2015    PCP: Lajean Manes, MD  REFERRING PROVIDER: Lajean Manes, MD  REFERRING DIAG: Balance  THERAPY DIAG:  Unsteadiness on feet  Repeated falls  Muscle weakness (generalized)  Rationale for Evaluation and Treatment Rehabilitation  ONSET DATE: a year ago.   SUBJECTIVE:   SUBJECTIVE STATEMENT: Pt reports to PT with complaints of instability. She states that over the past year she has progressively noticed decreased stability upon standing from a seated position, and the inability to walk in a straight line. She denies any recent falls in the last 6 months, but reports utilizing furniture and walls to help maintain her balance. Pt using SPC for community walking, but also has a 4WW in case she needs it.   PERTINENT HISTORY: Anemia, BBPV, Brain aneurysm, depression,  HTN, Stroke    PAIN:  Are you having pain? Yes: NPRS scale: 0/10 Pain location: N/A Pain description: N/A Aggravating factors: N/A Relieving factors: N/A  PRECAUTIONS: None  WEIGHT BEARING RESTRICTIONS No  FALLS:  Has patient fallen in last 6 months? No  LIVING ENVIRONMENT: Lives with: lives alone Lives in: House/apartment Stairs: Yes: External: 1 steps; on right going up Has following equipment at home: Single point cane  OCCUPATION: Retired   PLOF: Independent  PATIENT GOALS Pt would like to improve her balance.    OBJECTIVE:   DIAGNOSTIC FINDINGS:  None   COGNITION:  Overall cognitive status: Within functional limits for tasks assessed     SENSATION: WFL  POSTURE: No Significant postural limitations   LOWER EXTREMITY ROM:  Global ROM in bilat LE's WFL, with exception of hip extension with pt    LOWER EXTREMITY MMT:  MMT Right eval Left eval  Hip flexion 4+ 4+  Hip extension 2- 2-  Hip abduction 4- 4-  Hip adduction    Knee flexion 5 5  Knee extension 5 5    (Blank rows = not tested)  TUG: 18.79 with SPC  BERG BALANCE TEST Sitting to Standing: 4.      Stands without using hands and stabilize independently Standing Unsupported: 4.      Stands safely for 2 minutes Standing to Sitting: 4.     Sits safely with minimal use of hands Standing to Sitting: 4.     Sits safely with minimal use of hands Transfers: 4.     Transfers safely with minor use of hands Standing with eyes closed: 4.     Stands safely for 10 seconds  Standing with feet together: 4.     Stands for 1 minute safely Reaching forward with outstretched arm: 3.     Reaches forward 5 inches Retrieving object from the floor: 3.     Able to pick up with supervision Turning to look behind: 4.     Looks behind from both sides and weight shifts well Turning 360 degrees: 2.     Able to turn slowly, but safely Place alternate foot on stool: 0.     Unable, needs assist to keep from falling Standing with one foot in front: 0.     Loses balance while standing/stepping Standing on one foot: 1.     Holds <3 seconds  Total Score: 41/56  GAIT: Distance walked: 34f  Assistive device utilized: Single point cane Level of assistance: Complete Independence Comments: Pt ambulates with SPC with slow gait.    TODAY'S TREATMENT: Creating, reviewing, and completing below HEP   PATIENT EDUCATION:  Education details: Educated pt on anatomy and physiology of current symptoms, questionnaire, diagnosis, prognosis, HEP,  and POC. Person educated: Patient Education method: Explanation, Demonstration, Tactile cues, Verbal cues, and Handouts Education comprehension: verbalized understanding and returned demonstration   HOME EXERCISE PROGRAM: Access Code: [email or web address]: [email or web address] Date: 12/30/2021 Prepared by: SRudi Heap Exercises - Supine Bridge  - 2 x daily - 7 x weekly - 2 sets - 10 reps - Seated March  - 2 x daily - 7 x weekly - 2 sets - 10 reps - Seated Isometric Hip  Adduction with Ball  - 2 x daily - 7 x weekly - 2 sets - 10 reps - 5 hold  ASSESSMENT:  CLINICAL IMPRESSION: Patient is a 88y.o. F who was seen today for physical therapy evaluation and treatment for balance. She demonstrates good strength and ROM in bilat LE's, but lacks appropriate balance with functional movements. Pt ambulates well with SPC, with slow steady gait. Pt will benefit from skilled PT to address balance and lack of muscle strength in hip flexors, and glutes.    OBJECTIVE IMPAIRMENTS Abnormal gait, decreased balance, decreased endurance, difficulty walking, decreased ROM, decreased safety awareness, and dizziness.   ACTIVITY LIMITATIONS bending, standing, squatting, stairs, and locomotion level  PARTICIPATION LIMITATIONS: cleaning, driving, community activity, and church  PERSONAL FACTORS Age, Past/current experiences, Time since onset of injury/illness/exacerbation, and 3+ comorbidities: Anemia, BBPV, Brain aneurysm, depression,  HTN, Stroke    are also affecting patient's functional outcome.   REHAB POTENTIAL: Good  CLINICAL DECISION MAKING: Stable/uncomplicated  EVALUATION COMPLEXITY: Low   GOALS: Goals reviewed with patient? No  SHORT TERM GOALS: Target date: 01/27/2022  Pt will be I and compliant with initial HEP. Baseline:  Goal status: INITIAL  LONG TERM GOALS: Target date: 02/24/2022   Pt will be independent with advanced HEP to continue to address postural limitations and muscle imbalances. Baseline: not provided Goal status: INITIAL  2.  Pt will improve TUG by 3.4 seconds or Minimal clinically important difference.   Baseline:   Goal Status: INITIAL   3.  Pt will improve BERG by at least 3 points in order to demonstrate clinically significant improvement in balance.  Baseline:  Goal status: INITIAL  4.  Pt will improve TUG by 3.4 seconds or Minimal clinically important difference.  Baseline:  Goal status: INITIAL  5.  Pt will improve glute med  strength to 4+/5 Baseline:  Goal status: INITIAL   PLAN: PT FREQUENCY: 2x/week  PT DURATION: 8 weeks  PLANNED INTERVENTIONS: Therapeutic exercises, Therapeutic activity, Neuromuscular re-education, Balance training, Gait training, Patient/Family education, Self Care, Joint mobilization, Joint manipulation, Stair training, Vestibular training, Canalith repositioning, Aquatic Therapy, Cryotherapy, Moist heat, Taping, Traction, Ultrasound, and Manual therapy  PLAN FOR NEXT SESSION: Assess HEP/update PRN, continue to progress functional mobility, strengthen glute med/ max and hip flexor muscles. Improve balance.     Lynden Ang, PT 12/30/2021, 3:49 PM

## 2021-12-30 ENCOUNTER — Ambulatory Visit: Payer: Medicare Other | Attending: Geriatric Medicine | Admitting: Physical Therapy

## 2021-12-30 ENCOUNTER — Other Ambulatory Visit: Payer: Self-pay

## 2021-12-30 DIAGNOSIS — R296 Repeated falls: Secondary | ICD-10-CM | POA: Insufficient documentation

## 2021-12-30 DIAGNOSIS — M6281 Muscle weakness (generalized): Secondary | ICD-10-CM | POA: Diagnosis not present

## 2021-12-30 DIAGNOSIS — R2681 Unsteadiness on feet: Secondary | ICD-10-CM | POA: Diagnosis not present

## 2022-01-12 NOTE — Therapy (Unsigned)
OUTPATIENT PHYSICAL THERAPY TREATMENT NOTE   Patient Name: Tabitha Sanders MRN: [id number] DOB:1926/05/10, 88 y.o., female Today's Date: 01/13/2022  PCP: Lajean Manes, MD REFERRING PROVIDER: Lajean Manes, MD  END OF SESSION:   PT End of Session - 01/13/22 1410     Visit Number 2    Number of Visits 8    Date for PT Re-Evaluation 03/03/22    Authorization Type [employer]    PT Start Time 5188    PT Stop Time 4166    PT Time Calculation (min) 40 min    Activity Tolerance Patient tolerated treatment well    Behavior During Therapy WFL for tasks assessed/performed             Past Medical History:  Diagnosis Date   Anemia    Anemia    iron deficiency anemia and pernicious   Aneurysm, carotid artery, internal    Benign paroxysmal positional vertigo    Brain aneurysm 05/20/2015   Cardiomegaly 11/17/2017   Cerebral infarction due to occlusion of other cerebral artery ([place])    Depression    Essential hypertension 11/17/2017   H/O cerebral aneurysm repair 2016   Head trauma 11/17/2017   HOH (hard of hearing)    wears bilateral hearing aids   Hyperlipidemia 11/17/2017   Hypertension    Intracranial aneurysm    Paroxysmal atrial fibrillation ([place]) 12/13/2017   Polyarthritis rheumatica ([place])    Stroke [facility])    Stroke [facility]) January 25, 2015   No deficits   Subarachnoid bleed ([place]) 11/17/2017   Syncope and collapse 11/17/2017   Past Surgical History:  Procedure Laterality Date   ABDOMINAL HYSTERECTOMY     complete   BREAST SURGERY     biopsy- bilateral- benign   COLONOSCOPY WITH PROPOFOL N/A 08/20/2014   Procedure: COLONOSCOPY WITH PROPOFOL;  Surgeon: Howell Rucks, MD;  [facility];  Service: Endoscopy;  Laterality: N/A;   EYE SURGERY     bilateral cataract with lens implant   IR RADIOLOGIST EVAL & MGMT  12/14/2017   RADIOLOGY WITH ANESTHESIA N/A 05/20/2015   Procedure: RADIOLOGY WITH ANESTHESIA;  Surgeon: Luanne Bras, MD;  Location: [place];   Service: Radiology;  Laterality: N/A;   Patient Active Problem List   Diagnosis Date Noted   Paroxysmal atrial fibrillation ([place]) 12/13/2017   Subarachnoid bleed ([place]) 11/17/2017   Head trauma 11/17/2017   Essential hypertension 11/17/2017   Syncope and collapse 11/17/2017   Hyperlipidemia 11/17/2017   Cardiomegaly 11/17/2017   Brain aneurysm 05/20/2015   Intracranial aneurysm    Aneurysm, carotid artery, internal    Cerebral infarction due to occlusion of other cerebral artery ([facility])    Benign paroxysmal positional vertigo    Stroke ([place]) 01/25/2015    REFERRING DIAG: Balance disorder  THERAPY DIAG: Unsteadiness on feet   Repeated falls   Muscle weakness (generalized)   Rationale for Evaluation and Treatment Rehabilitation  PERTINENT HISTORY: Anemia, BBPV, Brain aneurysm, depression,  HTN, Stroke    PRECAUTIONS: fall  SUBJECTIVE: No pain to report.  Unable to quantify LOB cause today  PAIN:  Are you having pain? No   OBJECTIVE: (objective measures completed at initial evaluation unless otherwise dated)  DIAGNOSTIC FINDINGS: None     COGNITION:           Overall cognitive status: Within functional limits for tasks assessed  SENSATION: WFL   POSTURE: No Significant postural limitations     LOWER EXTREMITY ROM:   Global ROM in bilat LE's WFL, with exception of hip extension with pt      LOWER EXTREMITY MMT:   MMT Right eval Left eval  Hip flexion 4+ 4+  Hip extension 2- 2-  Hip abduction 4- 4-  Hip adduction      Knee flexion 5 5  Knee extension 5 5   (Blank rows = not tested)   TUG: 18.79 with SPC   BERG BALANCE TEST Sitting to Standing: 4.      Stands without using hands and stabilize independently Standing Unsupported: 4.      Stands safely for 2 minutes Standing to Sitting: 4.     Sits safely with minimal use of hands Standing to Sitting: 4.     Sits safely with minimal use of hands Transfers: 4.     Transfers  safely with minor use of hands Standing with eyes closed: 4.     Stands safely for 10 seconds  Standing with feet together: 4.     Stands for 1 minute safely Reaching forward with outstretched arm: 3.     Reaches forward 5 inches Retrieving object from the floor: 3.     Able to pick up with supervision Turning to look behind: 4.     Looks behind from both sides and weight shifts well Turning 360 degrees: 2.     Able to turn slowly, but safely Place alternate foot on stool: 0.     Unable, needs assist to keep from falling Standing with one foot in front: 0.     Loses balance while standing/stepping Standing on one foot: 1.     Holds <3 seconds   Total Score: 41/56   GAIT: Distance walked: 79f  Assistive device utilized: Single point cane Level of assistance: Complete Independence Comments: Pt ambulates with SPC with slow gait.      TODAY'S TREATMENT: [facility] Adult PT Treatment:                                                DATE: 01/13/22 Therapeutic Exercise: PF against wall 10x DF against wall 10x Marching against wall 10/10  FAQs with adduction 5x STS w/adduction  Therapeutic Activity:     01/13/22 0001  Dynamic Gait Index  Level Surface 2  Change in Gait Speed 2  Gait with Horizontal Head Turns 2  Gait with Vertical Head Turns 2  Gait and Pivot Turn 2  Step Over Obstacle 2  Step Around Obstacles 3  Steps 2  Total Score 17      PATIENT EDUCATION:  Education details: Educated pt on anatomy and physiology of current symptoms, questionnaire, diagnosis, prognosis, HEP,  and POC. Person educated: Patient Education method: Explanation, Demonstration, Tactile cues, Verbal cues, and Handouts Education comprehension: verbalized understanding and returned demonstration     HOME EXERCISE PROGRAM: Access Code: [email or web address]: [email or web address] Date: 01/13/2022 Prepared by: JSharlynn Oliphant Exercises - Seated Isometric Hip Adduction with BDiona Foley - 2 x daily -  7 x weekly - 2 sets - 10 reps - 5 hold - Standing Marching  - 2 x daily - 7 x weekly - 1 sets - 10 reps - Heel Toe Raises with Counter Support  - 2 x daily - 7  x weekly - 1 sets - 10 reps - Sit to Stand with Arms Crossed  - 2 x daily - 7 x weekly - 1 sets - 5 reps   ASSESSMENT:   CLINICAL IMPRESSION: No pain to report, unable to quantify LOB episodes, just feels "like a 88 year old" walking.  Today's session assessed the DGI as well as established ankle and hip strengthening strategies.  Updated HEP as noted       OBJECTIVE IMPAIRMENTS Abnormal gait, decreased balance, decreased endurance, difficulty walking, decreased ROM, decreased safety awareness, and dizziness.    ACTIVITY LIMITATIONS bending, standing, squatting, stairs, and locomotion level   PARTICIPATION LIMITATIONS: cleaning, driving, community activity, and church   PERSONAL FACTORS Age, Past/current experiences, Time since onset of injury/illness/exacerbation, and 3+ comorbidities: Anemia, BBPV, Brain aneurysm, depression,  HTN, Stroke    are also affecting patient's functional outcome.    REHAB POTENTIAL: Good   CLINICAL DECISION MAKING: Stable/uncomplicated   EVALUATION COMPLEXITY: Low     GOALS: Goals reviewed with patient? No   SHORT TERM GOALS: Target date: 01/27/2022  Pt will be I and compliant with initial HEP. Baseline:  Goal status: INITIAL   LONG TERM GOALS: Target date: 02/24/2022    Pt will be independent with advanced HEP to continue to address postural limitations and muscle imbalances. Baseline: not provided Goal status: INITIAL   2.  Pt will improve TUG by 3.4 seconds or Minimal clinically important difference.             Baseline:             Goal Status: INITIAL    3.  Pt will improve BERG by at least 3 points in order to demonstrate clinically significant improvement in balance.  Baseline:  Goal status: INITIAL   4.  Pt will improve TUG by 3.4 seconds or Minimal clinically important  difference.  Baseline:  Goal status: INITIAL   5.  Pt will improve glute med strength to 4+/5 Baseline:  Goal status: INITIAL     PLAN: PT FREQUENCY: 2x/week   PT DURATION: 8 weeks   PLANNED INTERVENTIONS: Therapeutic exercises, Therapeutic activity, Neuromuscular re-education, Balance training, Gait training, Patient/Family education, Self Care, Joint mobilization, Joint manipulation, Stair training, Vestibular training, Canalith repositioning, Aquatic Therapy, Cryotherapy, Moist heat, Taping, Traction, Ultrasound, and Manual therapy   PLAN FOR NEXT SESSION: Assess HEP/update PRN, continue to progress functional mobility, strengthen glute med/ max and hip flexor muscles. Improve balance.      Lanice Shirts, PT 01/13/2022, 3:57 PM

## 2022-01-13 ENCOUNTER — Ambulatory Visit: Payer: Medicare Other | Attending: Geriatric Medicine

## 2022-01-13 DIAGNOSIS — R296 Repeated falls: Secondary | ICD-10-CM | POA: Insufficient documentation

## 2022-01-13 DIAGNOSIS — M6281 Muscle weakness (generalized): Secondary | ICD-10-CM | POA: Insufficient documentation

## 2022-01-13 DIAGNOSIS — R2681 Unsteadiness on feet: Secondary | ICD-10-CM | POA: Insufficient documentation

## 2022-01-14 ENCOUNTER — Other Ambulatory Visit: Payer: Self-pay

## 2022-01-14 DIAGNOSIS — I480 Paroxysmal atrial fibrillation: Secondary | ICD-10-CM

## 2022-01-14 MED ORDER — APIXABAN 5 MG PO TABS: ORAL_TABLET | ORAL | 1 refills | Status: DC

## 2022-01-14 NOTE — Telephone Encounter (Signed)
Prescription refill request for Eliquis received. Indication: Atrial Fib/CVA Last office visit: 10/16/21  Elliot Cousin MD Scr: 1.48 on 10/16/21 Age: 88 Weight: 66.2kg  Based on above findings Eliquis '5mg'$  twice daily is the appropriate dose.  Refill approved.

## 2022-01-19 ENCOUNTER — Other Ambulatory Visit: Payer: Self-pay | Admitting: *Deleted

## 2022-01-19 DIAGNOSIS — I480 Paroxysmal atrial fibrillation: Secondary | ICD-10-CM

## 2022-01-19 MED ORDER — APIXABAN 5 MG PO TABS: ORAL_TABLET | ORAL | 1 refills | Status: DC

## 2022-01-19 NOTE — Telephone Encounter (Signed)
Eliquis '5mg'$  refill request received. Patient is 88 years old, weight-66.2kg, Crea-1.48 on 5/4/203, Diagnosis-Afib, and last seen by Dr. Curt Bears on 05/07/2021. Dose is appropriate based on dosing criteria. Will send in refill to requested pharmacy.    Last request to local pharmacy this to mail order will send at this time.

## 2022-01-20 DIAGNOSIS — N1832 Chronic kidney disease, stage 3b: Secondary | ICD-10-CM | POA: Diagnosis not present

## 2022-01-20 DIAGNOSIS — D510 Vitamin B12 deficiency anemia due to intrinsic factor deficiency: Secondary | ICD-10-CM | POA: Diagnosis not present

## 2022-01-20 DIAGNOSIS — I129 Hypertensive chronic kidney disease with stage 1 through stage 4 chronic kidney disease, or unspecified chronic kidney disease: Secondary | ICD-10-CM | POA: Diagnosis not present

## 2022-01-20 DIAGNOSIS — R809 Proteinuria, unspecified: Secondary | ICD-10-CM | POA: Diagnosis not present

## 2022-01-21 ENCOUNTER — Other Ambulatory Visit: Payer: Self-pay | Admitting: Internal Medicine

## 2022-01-21 ENCOUNTER — Ambulatory Visit: Payer: Medicare Other

## 2022-01-21 DIAGNOSIS — N1832 Chronic kidney disease, stage 3b: Secondary | ICD-10-CM

## 2022-01-26 ENCOUNTER — Ambulatory Visit
Admission: RE | Admit: 2022-01-26 | Discharge: 2022-01-26 | Disposition: A | Discharge status: Home / Self Care | Payer: Medicare Other | Source: Ambulatory Visit | Attending: Internal Medicine | Admitting: Internal Medicine

## 2022-01-26 DIAGNOSIS — N2889 Other specified disorders of kidney and ureter: Secondary | ICD-10-CM | POA: Diagnosis not present

## 2022-01-26 DIAGNOSIS — N189 Chronic kidney disease, unspecified: Secondary | ICD-10-CM | POA: Diagnosis not present

## 2022-01-26 DIAGNOSIS — N281 Cyst of kidney, acquired: Secondary | ICD-10-CM | POA: Diagnosis not present

## 2022-01-26 DIAGNOSIS — N1832 Chronic kidney disease, stage 3b: Secondary | ICD-10-CM

## 2022-01-28 ENCOUNTER — Ambulatory Visit: Payer: Medicare Other

## 2022-01-28 DIAGNOSIS — M6281 Muscle weakness (generalized): Secondary | ICD-10-CM

## 2022-01-28 DIAGNOSIS — R296 Repeated falls: Secondary | ICD-10-CM

## 2022-01-28 DIAGNOSIS — R2681 Unsteadiness on feet: Secondary | ICD-10-CM

## 2022-01-28 NOTE — Therapy (Addendum)
OUTPATIENT PHYSICAL THERAPY TREATMENT NOTE/DC SUMMARY   Patient Name: Tabitha Sanders MRN: [id number] DOB:08-23-1925, 88 y.o., female Today's Date: 01/28/2022  PCP: Lajean Manes, MD REFERRING PROVIDER: Lajean Manes, MD PHYSICAL THERAPY DISCHARGE SUMMARY  Visits from Start of Care: 3  Current functional level related to goals / functional outcomes: UTA   Remaining deficits: balance   Education / Equipment: HEP   Patient agrees to discharge. Patient goals were partially met. Patient is being discharged due to not returning since the last visit.  END OF SESSION:   PT End of Session - 01/28/22 1445     Visit Number 3    Number of Visits 8    Date for PT Re-Evaluation 03/03/22    Authorization Type [employer]ICARE    PT Start Time 9798    PT Stop Time 1525    PT Time Calculation (min) 40 min    Activity Tolerance Patient tolerated treatment well    Behavior During Therapy WFL for tasks assessed/performed              Past Medical History:  Diagnosis Date   Anemia    Anemia    iron deficiency anemia and pernicious   Aneurysm, carotid artery, internal    Benign paroxysmal positional vertigo    Brain aneurysm 05/20/2015   Cardiomegaly 11/17/2017   Cerebral infarction due to occlusion of other cerebral artery ([place])    Depression    Essential hypertension 11/17/2017   H/O cerebral aneurysm repair 2016   Head trauma 11/17/2017   HOH (hard of hearing)    wears bilateral hearing aids   Hyperlipidemia 11/17/2017   Hypertension    Intracranial aneurysm    Paroxysmal atrial fibrillation ([place]) 12/13/2017   Polyarthritis rheumatica ([place])    Stroke ([place])    Stroke [facility]) January 25, 2015   No deficits   Subarachnoid bleed ([place]) 11/17/2017   Syncope and collapse 11/17/2017   Past Surgical History:  Procedure Laterality Date   ABDOMINAL HYSTERECTOMY     complete   BREAST SURGERY     biopsy- bilateral- benign   COLONOSCOPY WITH PROPOFOL N/A 08/20/2014    Procedure: COLONOSCOPY WITH PROPOFOL;  Surgeon: Howell Rucks, MD;  Location: [facility];  Service: Endoscopy;  Laterality: N/A;   EYE SURGERY     bilateral cataract with lens implant   IR RADIOLOGIST EVAL & MGMT  12/14/2017   RADIOLOGY WITH ANESTHESIA N/A 05/20/2015   Procedure: RADIOLOGY WITH ANESTHESIA;  Surgeon: Luanne Bras, MD;  Location: [place];  Service: Radiology;  Laterality: N/A;   Patient Active Problem List   Diagnosis Date Noted   Paroxysmal atrial fibrillation ([place]) 12/13/2017   Subarachnoid bleed ([place]) 11/17/2017   Head trauma 11/17/2017   Essential hypertension 11/17/2017   Syncope and collapse 11/17/2017   Hyperlipidemia 11/17/2017   Cardiomegaly 11/17/2017   Brain aneurysm 05/20/2015   Intracranial aneurysm    Aneurysm, carotid artery, internal    Cerebral infarction due to occlusion of other cerebral artery ([facility])    Benign paroxysmal positional vertigo    Stroke ([place]) 01/25/2015    REFERRING DIAG: Balance disorder  THERAPY DIAG: Unsteadiness on feet   Repeated falls   Muscle weakness (generalized)   Rationale for Evaluation and Treatment Rehabilitation  PERTINENT HISTORY: Anemia, BBPV, Brain aneurysm, depression,  HTN, Stroke    PRECAUTIONS: fall  SUBJECTIVE: No pain to report.  Unable to quantify LOB cause today  PAIN:  Are you having pain? No  OBJECTIVE: (objective measures completed at initial evaluation unless otherwise dated)  DIAGNOSTIC FINDINGS: None     COGNITION:           Overall cognitive status: Within functional limits for tasks assessed                          SENSATION: WFL   POSTURE: No Significant postural limitations     LOWER EXTREMITY ROM:   Global ROM in bilat LE's WFL, with exception of hip extension with pt      LOWER EXTREMITY MMT:   MMT Right eval Left eval  Hip flexion 4+ 4+  Hip extension 2- 2-  Hip abduction 4- 4-  Hip adduction      Knee flexion 5 5  Knee extension 5 5   (Blank rows = not  tested)   TUG: 18.79 with SPC   BERG BALANCE TEST Sitting to Standing: 4.      Stands without using hands and stabilize independently Standing Unsupported: 4.      Stands safely for 2 minutes Standing to Sitting: 4.     Sits safely with minimal use of hands Standing to Sitting: 4.     Sits safely with minimal use of hands Transfers: 4.     Transfers safely with minor use of hands Standing with eyes closed: 4.     Stands safely for 10 seconds  Standing with feet together: 4.     Stands for 1 minute safely Reaching forward with outstretched arm: 3.     Reaches forward 5 inches Retrieving object from the floor: 3.     Able to pick up with supervision Turning to look behind: 4.     Looks behind from both sides and weight shifts well Turning 360 degrees: 2.     Able to turn slowly, but safely Place alternate foot on stool: 0.     Unable, needs assist to keep from falling Standing with one foot in front: 0.     Loses balance while standing/stepping Standing on one foot: 1.     Holds <3 seconds   Total Score: 41/56   GAIT: Distance walked: 46f  Assistive device utilized: Single point cane Level of assistance: Complete Independence Comments: Pt ambulates with SPC with slow gait.      [facility]                                                DATE: 01/28/22 Therapeutic Exercise: PF 15x with UE support DF 15x with UE support Marching with UE support 10/10  Step ups with single UE support 4in. 10/10 Lateral step ups with B UE support 10/10 Gastroc stretch over slant board 30x2  Neuromuscular re-ed: TUG with cane 12s    01/28/22 0001  Berg Balance Test  Sit to Stand 3  Standing Unsupported 4  Sitting with Back Unsupported but Feet Supported on Floor or Stool 4  Stand to Sit 4  Transfers 4  Standing Unsupported with Eyes Closed 4  Standing Unsupported with Feet Together 4  From Standing, Reach Forward with Outstretched Arm 3  From Standing  Position, Pick up Object from Floor 4  From Standing Position, Turn to Look Behind Over each Shoulder 4  Turn 360 Degrees 2  Standing Unsupported, Alternately Place  Feet on Step/Stool 3  Standing Unsupported, One Foot in [place] 2  Standing on One Leg 2  Total Score 47    [facility] Adult PT Treatment:                                                DATE: 01/13/22 Therapeutic Exercise: PF against wall 10x DF against wall 10x Marching against wall 10/10  FAQs with adduction 5x STS w/adduction  Therapeutic Activity:     01/13/22 0001  Dynamic Gait Index  Level Surface 2  Change in Gait Speed 2  Gait with Horizontal Head Turns 2  Gait with Vertical Head Turns 2  Gait and Pivot Turn 2  Step Over Obstacle 2  Step Around Obstacles 3  Steps 2  Total Score 17      PATIENT EDUCATION:  Education details: Educated pt on anatomy and physiology of current symptoms, questionnaire, diagnosis, prognosis, HEP,  and POC. Person educated: Patient Education method: Explanation, Demonstration, Tactile cues, Verbal cues, and Handouts Education comprehension: verbalized understanding and returned demonstration     HOME EXERCISE PROGRAM: Access Code: [email or web address] URL: [email or web address] Date: 01/13/2022 Prepared by: Sharlynn Oliphant  Exercises - Seated Isometric Hip Adduction with Diona Foley  - 2 x daily - 7 x weekly - 2 sets - 10 reps - 5 hold - Standing Marching  - 2 x daily - 7 x weekly - 1 sets - 10 reps - Heel Toe Raises with Counter Support  - 2 x daily - 7 x weekly - 1 sets - 10 reps - Sit to Stand with Arms Crossed  - 2 x daily - 7 x weekly - 1 sets - 5 reps   ASSESSMENT:   CLINICAL IMPRESSION: Remains unaware of strength and balance deficits and is observed to struggle when arising from sitting.  BERG score has increased to 47 but LE weakness/stiffness limits her from a passing score of 48.  Ankle weakness and stiffness noted with stepping tasks and contributory to unsteady  gait.  OBJECTIVE IMPAIRMENTS Abnormal gait, decreased balance, decreased endurance, difficulty walking, decreased ROM, decreased safety awareness, and dizziness.    ACTIVITY LIMITATIONS bending, standing, squatting, stairs, and locomotion level   PARTICIPATION LIMITATIONS: cleaning, driving, community activity, and church   PERSONAL FACTORS Age, Past/current experiences, Time since onset of injury/illness/exacerbation, and 3+ comorbidities: Anemia, BBPV, Brain aneurysm, depression,  HTN, Stroke    are also affecting patient's functional outcome.    REHAB POTENTIAL: Good   CLINICAL DECISION MAKING: Stable/uncomplicated   EVALUATION COMPLEXITY: Low     GOALS: Goals reviewed with patient? No   SHORT TERM GOALS: Target date: 01/27/2022  Pt will be I and compliant with initial HEP. Baseline: [id number] Goal status: INITIAL   LONG TERM GOALS: Target date: 02/24/2022    Pt will be independent with advanced HEP to continue to address postural limitations and muscle imbalances. Baseline: not provided Goal status: INITIAL   2.  Pt will improve TUG by 3.4 seconds or Minimal clinically important difference.             Baseline:             Goal Status: INITIAL    3.  Pt will improve BERG by at least 3 points in order to demonstrate clinically significant improvement in balance.  Baseline:  Goal status: INITIAL   4.  Pt will improve TUG by 3.4 seconds or Minimal clinically important difference.  Baseline:  Goal status: INITIAL   5.  Pt will improve glute med strength to 4+/5 Baseline:  Goal status: INITIAL     PLAN: PT FREQUENCY: 2x/week   PT DURATION: 8 weeks   PLANNED INTERVENTIONS: Therapeutic exercises, Therapeutic activity, Neuromuscular re-education, Balance training, Gait training, Patient/Family education, Self Care, Joint mobilization, Joint manipulation, Stair training, Vestibular training, Canalith repositioning, Aquatic Therapy, Cryotherapy, Moist heat, Taping,  Traction, Ultrasound, and Manual therapy   PLAN FOR NEXT SESSION: Assess HEP/update PRN, continue to progress functional mobility, strengthen glute med/ max and hip flexor muscles. Improve balance, address B ankle stiffness and weakness     Lanice Shirts, PT 01/28/2022, 3:38 PM

## 2022-02-04 ENCOUNTER — Telehealth: Payer: Self-pay

## 2022-02-04 ENCOUNTER — Ambulatory Visit: Payer: Medicare Other

## 2022-02-04 NOTE — Telephone Encounter (Signed)
Patient called to cancelled today and future appointment due to  not feeling well other health issues. She will discuss with doctor and reschedule if she has too.

## 2022-02-19 ENCOUNTER — Other Ambulatory Visit: Payer: Self-pay | Admitting: Cardiology

## 2022-03-10 DIAGNOSIS — H353111 Nonexudative age-related macular degeneration, right eye, early dry stage: Secondary | ICD-10-CM | POA: Diagnosis not present

## 2022-03-10 DIAGNOSIS — H40053 Ocular hypertension, bilateral: Secondary | ICD-10-CM | POA: Diagnosis not present

## 2022-03-10 DIAGNOSIS — H40023 Open angle with borderline findings, high risk, bilateral: Secondary | ICD-10-CM | POA: Diagnosis not present

## 2022-03-10 DIAGNOSIS — H353122 Nonexudative age-related macular degeneration, left eye, intermediate dry stage: Secondary | ICD-10-CM | POA: Diagnosis not present

## 2022-03-26 DIAGNOSIS — M810 Age-related osteoporosis without current pathological fracture: Secondary | ICD-10-CM | POA: Diagnosis not present

## 2022-03-26 DIAGNOSIS — Z23 Encounter for immunization: Secondary | ICD-10-CM | POA: Diagnosis not present

## 2022-05-01 DIAGNOSIS — H40053 Ocular hypertension, bilateral: Secondary | ICD-10-CM | POA: Diagnosis not present

## 2022-05-01 DIAGNOSIS — H40023 Open angle with borderline findings, high risk, bilateral: Secondary | ICD-10-CM | POA: Diagnosis not present

## 2022-05-14 DIAGNOSIS — R269 Unspecified abnormalities of gait and mobility: Secondary | ICD-10-CM | POA: Diagnosis not present

## 2022-05-14 DIAGNOSIS — I129 Hypertensive chronic kidney disease with stage 1 through stage 4 chronic kidney disease, or unspecified chronic kidney disease: Secondary | ICD-10-CM | POA: Diagnosis not present

## 2022-06-12 ENCOUNTER — Other Ambulatory Visit: Payer: Self-pay | Admitting: Cardiology

## 2022-07-12 NOTE — Progress Notes (Unsigned)
Electrophysiology Office Note   Date:  07/13/2022   ID:  Tabitha, Sanders 18-Aug-1925, MRN [id number]  PCP:  Lajean Manes, MD  Cardiologist:  Claiborne Billings Primary Electrophysiologist:  Keng Jewel Meredith Leeds, MD    No chief complaint on file.     History of Present Illness: MARELLY Sanders is a 89 y.o. female who is being seen today for the evaluation of atrial fibrillation at the request of Angie Duke. Presenting today for electrophysiology evaluation.    She has a history significant for hypertension, CVA, atrial fibrillation.  She was hospitalized in 2019 after syncope complicated by a subarachnoid hemorrhage.  Echo showed normal ejection fraction.  She was discharged on a monitor showing atrial fibrillation.  She is now on Multaq.  Today, denies symptoms of palpitations, chest pain, shortness of breath, orthopnea, PND, lower extremity edema, claudication, dizziness, presyncope, syncope, bleeding, or neurologic sequela. The patient is tolerating medications without difficulties.  Since being seen she has been feeling more and weak and fatigued.  She did have a fall 2 weeks ago.  She was standing.  She hurt her knee.  She has no chest pain, and she attributes the weight she has been feeling to her age of 8.   Past Medical History:  Diagnosis Date   Anemia    Anemia    iron deficiency anemia and pernicious   Aneurysm, carotid artery, internal    Benign paroxysmal positional vertigo    Brain aneurysm 05/20/2015   Cardiomegaly 11/17/2017   Cerebral infarction due to occlusion of other cerebral artery ([place])    Depression    Essential hypertension 11/17/2017   H/O cerebral aneurysm repair 2016   Head trauma 11/17/2017   HOH (hard of hearing)    wears bilateral hearing aids   Hyperlipidemia 11/17/2017   Hypertension    Intracranial aneurysm    Paroxysmal atrial fibrillation ([place]) 12/13/2017   Polyarthritis rheumatica ([place])    Stroke [facility])    Stroke [facility]) January 25, 2015   No deficits    Subarachnoid bleed ([place]) 11/17/2017   Syncope and collapse 11/17/2017   Past Surgical History:  Procedure Laterality Date   ABDOMINAL HYSTERECTOMY     complete   BREAST SURGERY     biopsy- bilateral- benign   COLONOSCOPY WITH PROPOFOL N/A 08/20/2014   Procedure: COLONOSCOPY WITH PROPOFOL;  Surgeon: Howell Rucks, MD;  [facility];  Service: Endoscopy;  Laterality: N/A;   EYE SURGERY     bilateral cataract with lens implant   IR RADIOLOGIST EVAL & MGMT  12/14/2017   RADIOLOGY WITH ANESTHESIA N/A 05/20/2015   Procedure: RADIOLOGY WITH ANESTHESIA;  Surgeon: Luanne Bras, MD;  Location: [place];  Service: Radiology;  Laterality: N/A;     Current Outpatient Medications  Medication Sig Dispense Refill   acetaminophen (TYLENOL) 500 MG tablet Take 500 mg by mouth every 6 (six) hours as needed for mild pain.     alendronate (FOSAMAX) 70 MG tablet Take 70 mg by mouth once a week.     amLODipine (NORVASC) 10 MG tablet Take 10 mg by mouth daily.  6   apixaban (ELIQUIS) 5 MG TABS tablet TAKE 1 TABLET(5 MG) BY MOUTH TWICE DAILY 180 tablet 1   atorvastatin (LIPITOR) 40 MG tablet Take 40 mg by mouth every evening.  3   Cyanocobalamin (VITAMIN B-12 PO) Take 1 tablet by mouth 2 (two) times a week. Take on Mondays and Wednesdays     dronedarone (MULTAQ) 400 MG tablet  TAKE 1 TABLET BY MOUTH TWICE DAILY WITH A MEAL. Please keep upcoming appointment for future refills. Thank you. 180 tablet 0   DULoxetine (CYMBALTA) 30 MG capsule Take 30 mg by mouth daily.  11   ergocalciferol (VITAMIN D2) 50000 UNITS capsule Take 50,000 Units by mouth once a week. Friday     ferrous sulfate 325 (65 FE) MG tablet Take 325 mg by mouth 3 (three) times a week. Take on Monday Wednesday Friday     hydrALAZINE (APRESOLINE) 50 MG tablet Take 1 tablet (50 mg total) by mouth 3 (three) times daily. Please call [phone] to schedule an appointment for future refills thank you. 270 tablet 0   irbesartan (AVAPRO) 150 MG  tablet Take 75 mg by mouth daily.     Melatonin 5 MG TABS Take 5 mg by mouth at bedtime as needed (for sleep).     Multiple Vitamins-Minerals (PRESERVISION AREDS PO) Take 1 capsule by mouth 2 (two) times daily.     No current facility-administered medications for this visit.    Allergies:   Codeine, Lidocaine-epinephrine, Lisinopril, and Lisinopril   Social History:  The patient  reports that she has never smoked. She has never used smokeless tobacco. She reports that she does not drink alcohol and does not use drugs.   Family History:  The patient's family history includes CVA in her mother and sister; Stroke in her mother and sister.   ROS:  Please see the history of present illness.   Otherwise, review of systems is positive for none.   All other systems are reviewed and negative.   PHYSICAL EXAM: VS:  BP (!) 164/60   Pulse (!) 105   Ht '5\' 2"'$  (1.575 m)   Wt 135 lb (61.2 kg)   SpO2 97%   BMI 24.69 kg/m  , BMI Body mass index is 24.69 kg/m. GEN: Well nourished, well developed, in no acute distress  HEENT: normal  Neck: no JVD, carotid bruits, or masses Cardiac: tachycardic, irregular; no murmurs, rubs, or gallops,no edema  Respiratory:  clear to auscultation bilaterally, normal work of breathing GI: soft, nontender, nondistended, + BS MS: no deformity or atrophy  Skin: warm and dry Neuro:  Strength and sensation are intact Psych: euthymic mood, full affect  EKG:  EKG is ordered today. Personal review of the ekg ordered shows sinus tachycardia with PACs and PVCs  Recent Labs: 10/16/2021: BUN 16; Creatinine, Ser 1.48; Hemoglobin 11.3; Platelets 307; Potassium 4.6; Sodium 143    Lipid Panel     Component Value Date/Time   CHOL 186 01/26/2015 0557   TRIG 351 (H) 01/26/2015 0557   HDL 30 (L) 01/26/2015 0557   CHOLHDL 6.2 01/26/2015 0557   VLDL 70 (H) 01/26/2015 0557   LDLCALC 86 01/26/2015 0557     Wt Readings from Last 3 Encounters:  07/13/22 135 lb (61.2 kg)   10/16/21 146 lb (66.2 kg)  05/07/21 151 lb 3.2 oz (68.6 kg)      Other studies Reviewed: Additional studies/ records that were reviewed today include: TTE 11/18/17  Review of the above records today demonstrates:  - Left ventricle: The cavity size was normal. Wall thickness was   increased in a pattern of mild LVH. Systolic function was normal.   The estimated ejection fraction was in the range of 55% to 60%.   Wall motion was normal; there were no regional wall motion   abnormalities. Left ventricular diastolic function parameters   were normal. - Aortic valve: There  was very mild stenosis. - Left atrium: The atrium was moderately dilated. - Atrial septum: No defect or patent foramen ovale was identified. - Pulmonary arteries: PA peak pressure: 31 mm Hg (S).   ASSESSMENT AND PLAN:  1.  Paroxysmal atrial fibrillation: Currently on Multaq 400 mg twice daily, Eliquis 5 mg twice daily.  CHA2DS2-VASc of 4.  Remains in sinus rhythm.  She feels weak and fatigued, and has been unsteady on her feet.  She is in sinus rhythm today with quite a few PACs.  She states that her heart rate is not usually fast.  She Annia Gomm let us know if it remains fast and we Jaryn Hocutt likely have her wear a cardiac monitor.  2.  Hypertension: Elevated today.  Usually well-controlled.  No changes.  3.  Sick sinus syndrome: Occurred post cardioversion.  No further episodes.  Continue with current management.  4.  Secondary to coagula state: Currently on Eliquis for atrial fibrillation as above.  Levi Klaiber check labs as below for Eliquis monitoring.   Current medicines are reviewed at length with the patient today.   The patient does not have concerns regarding her medicines.  The following changes were made today: None  Labs/ tests ordered today include:  Orders Placed This Encounter  Procedures   Basic metabolic panel   CBC   EKG 12-Lead      Disposition:   FU 6 months  Signed, Jettson Crable Meredith Leeds, MD  07/13/2022  12:26 PM     [facility] [address] [place] [address] [phone] (office) [phone] (fax)

## 2022-07-13 ENCOUNTER — Ambulatory Visit: Payer: Medicare Other | Attending: Cardiology | Admitting: Cardiology

## 2022-07-13 ENCOUNTER — Encounter: Payer: Self-pay | Admitting: Cardiology

## 2022-07-13 VITALS — BP 164/60 | HR 105 | Ht 62.0 in | Wt 135.0 lb

## 2022-07-13 DIAGNOSIS — Z79899 Other long term (current) drug therapy: Secondary | ICD-10-CM | POA: Diagnosis not present

## 2022-07-13 DIAGNOSIS — I480 Paroxysmal atrial fibrillation: Secondary | ICD-10-CM

## 2022-07-13 DIAGNOSIS — D6869 Other thrombophilia: Secondary | ICD-10-CM

## 2022-07-13 NOTE — Patient Instructions (Addendum)
Medication Instructions:  Your physician recommends that you continue on your current medications as directed. Please refer to the Current Medication list given to you today.  *If you need a refill on your cardiac medications before your next appointment, please call your pharmacy*   Lab Work: Eliquis surveillance lab work today: BMET & CBC  If you have labs (blood work) drawn today and your tests are completely normal, you will receive your results only by: [employer] (if you have MyChart) OR A paper copy in the mail If you have any lab test that is abnormal or we need to change your treatment, we will call you to review the results.   Testing/Procedures: None ordered   Follow-Up: At [facility], you and your health needs are our priority.  As part of our continuing mission to provide you with exceptional heart care, we have created designated Provider Care Teams.  These Care Teams include your primary Cardiologist (physician) and Advanced Practice Providers (APPs -  Physician Assistants and Nurse Practitioners) who all work together to provide you with the care you need, when you need it.  Your next appointment:   6 month(s)  The format for your next appointment:   In Person  Provider:   Allegra Lai, MD    Thank you for choosing [facility]!!   Trinidad Curet, RN [phone]

## 2022-07-14 LAB — BASIC METABOLIC PANEL
BUN/Creatinine Ratio: 12 (ref 12–28)
BUN: 17 mg/dL (ref 10–36)
CO2: 18 mmol/L — ABNORMAL LOW (ref 20–29)
Calcium: 9.9 mg/dL (ref 8.7–10.3)
Chloride: 108 mmol/L — ABNORMAL HIGH (ref 96–106)
Creatinine, Ser: 1.37 mg/dL — ABNORMAL HIGH (ref 0.57–1.00)
Glucose: 111 mg/dL — ABNORMAL HIGH (ref 70–99)
Potassium: 4.4 mmol/L (ref 3.5–5.2)
Sodium: 142 mmol/L (ref 134–144)
eGFR: 36 mL/min/{1.73_m2} — ABNORMAL LOW (ref >59)

## 2022-07-14 LAB — CBC
Hematocrit: 30.3 % — ABNORMAL LOW (ref 34.0–46.6)
Hemoglobin: 9.8 g/dL — ABNORMAL LOW (ref 11.1–15.9)
MCH: 29.4 pg (ref 26.6–33.0)
MCHC: 32.3 g/dL (ref 31.5–35.7)
MCV: 91 fL (ref 79–97)
Platelets: 320 10*3/uL (ref 150–450)
RBC: 3.33 x10E6/uL — ABNORMAL LOW (ref 3.77–5.28)
RDW: 14.9 % (ref 11.7–15.4)
WBC: 8.3 10*3/uL (ref 3.4–10.8)

## 2022-07-16 DIAGNOSIS — N1832 Chronic kidney disease, stage 3b: Secondary | ICD-10-CM | POA: Diagnosis not present

## 2022-07-16 DIAGNOSIS — R809 Proteinuria, unspecified: Secondary | ICD-10-CM | POA: Diagnosis not present

## 2022-07-16 DIAGNOSIS — D510 Vitamin B12 deficiency anemia due to intrinsic factor deficiency: Secondary | ICD-10-CM | POA: Diagnosis not present

## 2022-07-16 DIAGNOSIS — I129 Hypertensive chronic kidney disease with stage 1 through stage 4 chronic kidney disease, or unspecified chronic kidney disease: Secondary | ICD-10-CM | POA: Diagnosis not present

## 2022-07-21 ENCOUNTER — Other Ambulatory Visit: Payer: Self-pay | Admitting: Internal Medicine

## 2022-07-21 DIAGNOSIS — N1832 Chronic kidney disease, stage 3b: Secondary | ICD-10-CM

## 2022-07-28 ENCOUNTER — Other Ambulatory Visit: Payer: Self-pay | Admitting: Cardiovascular Disease

## 2022-07-28 DIAGNOSIS — I480 Paroxysmal atrial fibrillation: Secondary | ICD-10-CM

## 2022-07-28 NOTE — Telephone Encounter (Signed)
Prescription refill request for Eliquis received. Indication: afib  Last office visit: [place], 10/16/2021 Scr: 1.37, 07/13/2022 Age: 89  Weight: 61.2kg  Regill sent.

## 2022-07-31 DIAGNOSIS — D649 Anemia, unspecified: Secondary | ICD-10-CM | POA: Diagnosis not present

## 2022-07-31 DIAGNOSIS — M810 Age-related osteoporosis without current pathological fracture: Secondary | ICD-10-CM | POA: Diagnosis not present

## 2022-07-31 DIAGNOSIS — G629 Polyneuropathy, unspecified: Secondary | ICD-10-CM | POA: Diagnosis not present

## 2022-07-31 DIAGNOSIS — D510 Vitamin B12 deficiency anemia due to intrinsic factor deficiency: Secondary | ICD-10-CM | POA: Diagnosis not present

## 2022-07-31 DIAGNOSIS — R269 Unspecified abnormalities of gait and mobility: Secondary | ICD-10-CM | POA: Diagnosis not present

## 2022-07-31 DIAGNOSIS — I129 Hypertensive chronic kidney disease with stage 1 through stage 4 chronic kidney disease, or unspecified chronic kidney disease: Secondary | ICD-10-CM | POA: Diagnosis not present

## 2022-08-03 DIAGNOSIS — E785 Hyperlipidemia, unspecified: Secondary | ICD-10-CM | POA: Diagnosis not present

## 2022-08-03 DIAGNOSIS — R269 Unspecified abnormalities of gait and mobility: Secondary | ICD-10-CM | POA: Diagnosis not present

## 2022-08-03 DIAGNOSIS — N184 Chronic kidney disease, stage 4 (severe): Secondary | ICD-10-CM | POA: Diagnosis not present

## 2022-08-03 DIAGNOSIS — Z79899 Other long term (current) drug therapy: Secondary | ICD-10-CM | POA: Diagnosis not present

## 2022-08-03 DIAGNOSIS — F32A Depression, unspecified: Secondary | ICD-10-CM | POA: Diagnosis not present

## 2022-08-03 DIAGNOSIS — D510 Vitamin B12 deficiency anemia due to intrinsic factor deficiency: Secondary | ICD-10-CM | POA: Diagnosis not present

## 2022-08-03 DIAGNOSIS — I129 Hypertensive chronic kidney disease with stage 1 through stage 4 chronic kidney disease, or unspecified chronic kidney disease: Secondary | ICD-10-CM | POA: Diagnosis not present

## 2022-08-03 DIAGNOSIS — I480 Paroxysmal atrial fibrillation: Secondary | ICD-10-CM | POA: Diagnosis not present

## 2022-08-03 DIAGNOSIS — Z7901 Long term (current) use of anticoagulants: Secondary | ICD-10-CM | POA: Diagnosis not present

## 2022-08-03 DIAGNOSIS — M810 Age-related osteoporosis without current pathological fracture: Secondary | ICD-10-CM | POA: Diagnosis not present

## 2022-08-03 DIAGNOSIS — G629 Polyneuropathy, unspecified: Secondary | ICD-10-CM | POA: Diagnosis not present

## 2022-08-03 DIAGNOSIS — D6869 Other thrombophilia: Secondary | ICD-10-CM | POA: Diagnosis not present

## 2022-08-03 DIAGNOSIS — Z9181 History of falling: Secondary | ICD-10-CM | POA: Diagnosis not present

## 2022-08-06 ENCOUNTER — Telehealth: Payer: Self-pay | Admitting: Cardiology

## 2022-08-06 DIAGNOSIS — Z9181 History of falling: Secondary | ICD-10-CM | POA: Diagnosis not present

## 2022-08-06 DIAGNOSIS — E785 Hyperlipidemia, unspecified: Secondary | ICD-10-CM | POA: Diagnosis not present

## 2022-08-06 DIAGNOSIS — R269 Unspecified abnormalities of gait and mobility: Secondary | ICD-10-CM | POA: Diagnosis not present

## 2022-08-06 DIAGNOSIS — I480 Paroxysmal atrial fibrillation: Secondary | ICD-10-CM | POA: Diagnosis not present

## 2022-08-06 DIAGNOSIS — M810 Age-related osteoporosis without current pathological fracture: Secondary | ICD-10-CM | POA: Diagnosis not present

## 2022-08-06 DIAGNOSIS — D6869 Other thrombophilia: Secondary | ICD-10-CM | POA: Diagnosis not present

## 2022-08-06 DIAGNOSIS — N184 Chronic kidney disease, stage 4 (severe): Secondary | ICD-10-CM | POA: Diagnosis not present

## 2022-08-06 DIAGNOSIS — Z7901 Long term (current) use of anticoagulants: Secondary | ICD-10-CM | POA: Diagnosis not present

## 2022-08-06 DIAGNOSIS — D510 Vitamin B12 deficiency anemia due to intrinsic factor deficiency: Secondary | ICD-10-CM | POA: Diagnosis not present

## 2022-08-06 DIAGNOSIS — G629 Polyneuropathy, unspecified: Secondary | ICD-10-CM | POA: Diagnosis not present

## 2022-08-06 DIAGNOSIS — F32A Depression, unspecified: Secondary | ICD-10-CM | POA: Diagnosis not present

## 2022-08-06 DIAGNOSIS — Z79899 Other long term (current) drug therapy: Secondary | ICD-10-CM | POA: Diagnosis not present

## 2022-08-06 DIAGNOSIS — I129 Hypertensive chronic kidney disease with stage 1 through stage 4 chronic kidney disease, or unspecified chronic kidney disease: Secondary | ICD-10-CM | POA: Diagnosis not present

## 2022-08-06 NOTE — Telephone Encounter (Signed)
Pt c/o medication issue:  1. Name of Medication: Eliquis  2. How are you currently taking this medication (dosage and times per day)?  2 times a day  3. Are you having a reaction (difficulty breathing--STAT)?   4. What is your medication issue?  Patient have been falling a lot, because she have Neuropathy- she wonder if her Eliquis should be changed,since she is falling a lot?

## 2022-08-06 NOTE — Telephone Encounter (Signed)
Pt aware Dr. Curt Bears would like her to see EP APP to further discuss this before making a final decision. Aware our scheduled will call her to arrange. Patient verbalized understanding and agreeable to plan.

## 2022-08-10 DIAGNOSIS — Z79899 Other long term (current) drug therapy: Secondary | ICD-10-CM | POA: Diagnosis not present

## 2022-08-10 DIAGNOSIS — Z7901 Long term (current) use of anticoagulants: Secondary | ICD-10-CM | POA: Diagnosis not present

## 2022-08-10 DIAGNOSIS — M810 Age-related osteoporosis without current pathological fracture: Secondary | ICD-10-CM | POA: Diagnosis not present

## 2022-08-10 DIAGNOSIS — R269 Unspecified abnormalities of gait and mobility: Secondary | ICD-10-CM | POA: Diagnosis not present

## 2022-08-10 DIAGNOSIS — F32A Depression, unspecified: Secondary | ICD-10-CM | POA: Diagnosis not present

## 2022-08-10 DIAGNOSIS — I480 Paroxysmal atrial fibrillation: Secondary | ICD-10-CM | POA: Diagnosis not present

## 2022-08-10 DIAGNOSIS — G629 Polyneuropathy, unspecified: Secondary | ICD-10-CM | POA: Diagnosis not present

## 2022-08-10 DIAGNOSIS — E785 Hyperlipidemia, unspecified: Secondary | ICD-10-CM | POA: Diagnosis not present

## 2022-08-10 DIAGNOSIS — Z9181 History of falling: Secondary | ICD-10-CM | POA: Diagnosis not present

## 2022-08-10 DIAGNOSIS — I129 Hypertensive chronic kidney disease with stage 1 through stage 4 chronic kidney disease, or unspecified chronic kidney disease: Secondary | ICD-10-CM | POA: Diagnosis not present

## 2022-08-10 DIAGNOSIS — N184 Chronic kidney disease, stage 4 (severe): Secondary | ICD-10-CM | POA: Diagnosis not present

## 2022-08-10 DIAGNOSIS — D6869 Other thrombophilia: Secondary | ICD-10-CM | POA: Diagnosis not present

## 2022-08-10 DIAGNOSIS — D510 Vitamin B12 deficiency anemia due to intrinsic factor deficiency: Secondary | ICD-10-CM | POA: Diagnosis not present

## 2022-08-10 NOTE — Telephone Encounter (Signed)
Pt seeing EP APP, Oda Kilts, on 3/7

## 2022-08-13 DIAGNOSIS — I480 Paroxysmal atrial fibrillation: Secondary | ICD-10-CM | POA: Diagnosis not present

## 2022-08-13 DIAGNOSIS — Z9181 History of falling: Secondary | ICD-10-CM | POA: Diagnosis not present

## 2022-08-13 DIAGNOSIS — Z7901 Long term (current) use of anticoagulants: Secondary | ICD-10-CM | POA: Diagnosis not present

## 2022-08-13 DIAGNOSIS — G629 Polyneuropathy, unspecified: Secondary | ICD-10-CM | POA: Diagnosis not present

## 2022-08-13 DIAGNOSIS — D6869 Other thrombophilia: Secondary | ICD-10-CM | POA: Diagnosis not present

## 2022-08-13 DIAGNOSIS — R269 Unspecified abnormalities of gait and mobility: Secondary | ICD-10-CM | POA: Diagnosis not present

## 2022-08-13 DIAGNOSIS — D510 Vitamin B12 deficiency anemia due to intrinsic factor deficiency: Secondary | ICD-10-CM | POA: Diagnosis not present

## 2022-08-13 DIAGNOSIS — E785 Hyperlipidemia, unspecified: Secondary | ICD-10-CM | POA: Diagnosis not present

## 2022-08-13 DIAGNOSIS — F32A Depression, unspecified: Secondary | ICD-10-CM | POA: Diagnosis not present

## 2022-08-13 DIAGNOSIS — N184 Chronic kidney disease, stage 4 (severe): Secondary | ICD-10-CM | POA: Diagnosis not present

## 2022-08-13 DIAGNOSIS — M810 Age-related osteoporosis without current pathological fracture: Secondary | ICD-10-CM | POA: Diagnosis not present

## 2022-08-13 DIAGNOSIS — I129 Hypertensive chronic kidney disease with stage 1 through stage 4 chronic kidney disease, or unspecified chronic kidney disease: Secondary | ICD-10-CM | POA: Diagnosis not present

## 2022-08-13 DIAGNOSIS — Z79899 Other long term (current) drug therapy: Secondary | ICD-10-CM | POA: Diagnosis not present

## 2022-08-14 DIAGNOSIS — H353122 Nonexudative age-related macular degeneration, left eye, intermediate dry stage: Secondary | ICD-10-CM | POA: Diagnosis not present

## 2022-08-14 DIAGNOSIS — H353111 Nonexudative age-related macular degeneration, right eye, early dry stage: Secondary | ICD-10-CM | POA: Diagnosis not present

## 2022-08-14 DIAGNOSIS — H40053 Ocular hypertension, bilateral: Secondary | ICD-10-CM | POA: Diagnosis not present

## 2022-08-14 DIAGNOSIS — H35033 Hypertensive retinopathy, bilateral: Secondary | ICD-10-CM | POA: Diagnosis not present

## 2022-08-17 DIAGNOSIS — D510 Vitamin B12 deficiency anemia due to intrinsic factor deficiency: Secondary | ICD-10-CM | POA: Diagnosis not present

## 2022-08-17 DIAGNOSIS — Z79899 Other long term (current) drug therapy: Secondary | ICD-10-CM | POA: Diagnosis not present

## 2022-08-17 DIAGNOSIS — G629 Polyneuropathy, unspecified: Secondary | ICD-10-CM | POA: Diagnosis not present

## 2022-08-17 DIAGNOSIS — Z7901 Long term (current) use of anticoagulants: Secondary | ICD-10-CM | POA: Diagnosis not present

## 2022-08-17 DIAGNOSIS — I129 Hypertensive chronic kidney disease with stage 1 through stage 4 chronic kidney disease, or unspecified chronic kidney disease: Secondary | ICD-10-CM | POA: Diagnosis not present

## 2022-08-17 DIAGNOSIS — R269 Unspecified abnormalities of gait and mobility: Secondary | ICD-10-CM | POA: Diagnosis not present

## 2022-08-17 DIAGNOSIS — I480 Paroxysmal atrial fibrillation: Secondary | ICD-10-CM | POA: Diagnosis not present

## 2022-08-17 DIAGNOSIS — N184 Chronic kidney disease, stage 4 (severe): Secondary | ICD-10-CM | POA: Diagnosis not present

## 2022-08-17 DIAGNOSIS — E785 Hyperlipidemia, unspecified: Secondary | ICD-10-CM | POA: Diagnosis not present

## 2022-08-17 DIAGNOSIS — F32A Depression, unspecified: Secondary | ICD-10-CM | POA: Diagnosis not present

## 2022-08-17 DIAGNOSIS — Z9181 History of falling: Secondary | ICD-10-CM | POA: Diagnosis not present

## 2022-08-17 DIAGNOSIS — M810 Age-related osteoporosis without current pathological fracture: Secondary | ICD-10-CM | POA: Diagnosis not present

## 2022-08-17 DIAGNOSIS — D6869 Other thrombophilia: Secondary | ICD-10-CM | POA: Diagnosis not present

## 2022-08-18 DIAGNOSIS — H43813 Vitreous degeneration, bilateral: Secondary | ICD-10-CM | POA: Diagnosis not present

## 2022-08-18 DIAGNOSIS — H353132 Nonexudative age-related macular degeneration, bilateral, intermediate dry stage: Secondary | ICD-10-CM | POA: Diagnosis not present

## 2022-08-18 DIAGNOSIS — Z961 Presence of intraocular lens: Secondary | ICD-10-CM | POA: Diagnosis not present

## 2022-08-18 DIAGNOSIS — H35423 Microcystoid degeneration of retina, bilateral: Secondary | ICD-10-CM | POA: Diagnosis not present

## 2022-08-19 ENCOUNTER — Ambulatory Visit
Admission: RE | Admit: 2022-08-19 | Discharge: 2022-08-19 | Disposition: A | Discharge status: Home / Self Care | Payer: Medicare Other | Source: Ambulatory Visit | Attending: Internal Medicine | Admitting: Internal Medicine

## 2022-08-19 DIAGNOSIS — K8020 Calculus of gallbladder without cholecystitis without obstruction: Secondary | ICD-10-CM | POA: Diagnosis not present

## 2022-08-19 DIAGNOSIS — N1832 Chronic kidney disease, stage 3b: Secondary | ICD-10-CM

## 2022-08-19 DIAGNOSIS — N189 Chronic kidney disease, unspecified: Secondary | ICD-10-CM | POA: Diagnosis not present

## 2022-08-19 DIAGNOSIS — R161 Splenomegaly, not elsewhere classified: Secondary | ICD-10-CM | POA: Diagnosis not present

## 2022-08-19 DIAGNOSIS — N281 Cyst of kidney, acquired: Secondary | ICD-10-CM | POA: Diagnosis not present

## 2022-08-19 NOTE — Progress Notes (Unsigned)
  Cardiology Office Note:   Date:  08/20/2022  ID:  Tabitha Sanders, DOB 01/29/1926, MRN [id number]  Primary Cardiologist: Shelva Majestic, MD Electrophysiologist: Constance Haw, MD   History of Present Illness:   Tabitha Sanders is a 89 y.o. female with history of HTN, CVA, AF, and frequent falls seen today for routine electrophysiology followup. Since last being seen in our clinic the patient reports doing very well.   She states she has not had a fall in > 6 weeks now that she has started using her walker regularly. Her daughter counters that she did have a fall in the kitchen 2 weeks ago, but the pt denies.  she denies chest pain, palpitations, dyspnea, PND, orthopnea, nausea, vomiting, dizziness, syncope, edema, weight gain, or early satiety.   Review of systems complete and found to be negative unless listed in HPI.    Studies Reviewed:    EKG is not ordered today. EKG from 07/13/2022 reviewed which showed sinus tach at 105 bpm  Risk Assessment/Calculations:    CHA2DS2-VASc Score = 6    Physical Exam:   VS:  BP 132/74   Pulse 81   Ht '5\' 2"'$  (1.575 m)   Wt 135 lb 3.2 oz (61.3 kg)   SpO2 98%   BMI 24.73 kg/m    Wt Readings from Last 3 Encounters:  08/20/22 135 lb 3.2 oz (61.3 kg)  07/13/22 135 lb (61.2 kg)  10/16/21 146 lb (66.2 kg)     GEN: Well nourished, well developed in no acute distress NECK: No JVD; No carotid bruits CARDIAC: Regular rate and rhythm, no murmurs, rubs, gallops RESPIRATORY:  Clear to auscultation without rales, wheezing or rhonchi  ABDOMEN: Soft, non-tender, non-distended EXTREMITIES:  No edema; No deformity   ASSESSMENT AND PLAN:   Paroxysmal AF Continue multaq 400 mg BID Continue Eliquis 5 mg BID. We discussed at length that without significant injury, her frequency of falls would have to be in the many dozens before outweighing the benefit of Eliquis. Pt willing to continue Eliquis at this time.  Fall precautions reviewed  HTN Stable  on current regimen   Frequent Falls Needs to use walker or 4 pronged cane at all times.    Follow up with Dr. Curt Bears in 6 months  Signed, Shirley Friar, PA-C

## 2022-08-20 ENCOUNTER — Ambulatory Visit: Payer: Medicare Other | Attending: Student | Admitting: Student

## 2022-08-20 ENCOUNTER — Encounter: Payer: Self-pay | Admitting: Student

## 2022-08-20 VITALS — BP 132/74 | HR 81 | Ht 62.0 in | Wt 135.2 lb

## 2022-08-20 DIAGNOSIS — R296 Repeated falls: Secondary | ICD-10-CM

## 2022-08-20 DIAGNOSIS — I480 Paroxysmal atrial fibrillation: Secondary | ICD-10-CM | POA: Diagnosis not present

## 2022-08-20 DIAGNOSIS — I10 Essential (primary) hypertension: Secondary | ICD-10-CM

## 2022-08-20 NOTE — Patient Instructions (Signed)
Medication Instructions:   Your physician recommends that you continue on your current medications as directed. Please refer to the Current Medication list given to you today.   *If you need a refill on your cardiac medications before your next appointment, please call your pharmacy*   Lab Work:  None ordered.  If you have labs (blood work) drawn today and your tests are completely normal, you will receive your results only by: [place] (if you have MyChart) OR A paper copy in the mail If you have any lab test that is abnormal or we need to change your treatment, we will call you to review the results.   Testing/Procedures:  None ordered.    Follow-Up: At [facility], you and your health needs are our priority.  As part of our continuing mission to provide you with exceptional heart care, we have created designated Provider Care Teams.  These Care Teams include your primary Cardiologist (physician) and Advanced Practice Providers (APPs -  Physician Assistants and Nurse Practitioners) who all work together to provide you with the care you need, when you need it.  We recommend signing up for the patient portal called "MyChart".  Sign up information is provided on this After Visit Summary.  MyChart is used to connect with patients for Virtual Visits (Telemedicine).  Patients are able to view lab/test results, encounter notes, upcoming appointments, etc.  Non-urgent messages can be sent to your provider as well.   To learn more about what you can do with MyChart, go to [email or web address].    Your next appointment:   5 month(s)  Provider:   Allegra Lai, MD

## 2022-08-24 DIAGNOSIS — G629 Polyneuropathy, unspecified: Secondary | ICD-10-CM | POA: Diagnosis not present

## 2022-08-24 DIAGNOSIS — E785 Hyperlipidemia, unspecified: Secondary | ICD-10-CM | POA: Diagnosis not present

## 2022-08-24 DIAGNOSIS — I129 Hypertensive chronic kidney disease with stage 1 through stage 4 chronic kidney disease, or unspecified chronic kidney disease: Secondary | ICD-10-CM | POA: Diagnosis not present

## 2022-08-24 DIAGNOSIS — F32A Depression, unspecified: Secondary | ICD-10-CM | POA: Diagnosis not present

## 2022-08-24 DIAGNOSIS — R269 Unspecified abnormalities of gait and mobility: Secondary | ICD-10-CM | POA: Diagnosis not present

## 2022-08-24 DIAGNOSIS — D510 Vitamin B12 deficiency anemia due to intrinsic factor deficiency: Secondary | ICD-10-CM | POA: Diagnosis not present

## 2022-08-24 DIAGNOSIS — N184 Chronic kidney disease, stage 4 (severe): Secondary | ICD-10-CM | POA: Diagnosis not present

## 2022-08-24 DIAGNOSIS — I480 Paroxysmal atrial fibrillation: Secondary | ICD-10-CM | POA: Diagnosis not present

## 2022-08-24 DIAGNOSIS — M810 Age-related osteoporosis without current pathological fracture: Secondary | ICD-10-CM | POA: Diagnosis not present

## 2022-08-24 DIAGNOSIS — D6869 Other thrombophilia: Secondary | ICD-10-CM | POA: Diagnosis not present

## 2022-08-24 DIAGNOSIS — Z79899 Other long term (current) drug therapy: Secondary | ICD-10-CM | POA: Diagnosis not present

## 2022-08-24 DIAGNOSIS — Z7901 Long term (current) use of anticoagulants: Secondary | ICD-10-CM | POA: Diagnosis not present

## 2022-08-24 DIAGNOSIS — Z9181 History of falling: Secondary | ICD-10-CM | POA: Diagnosis not present

## 2022-08-30 DIAGNOSIS — M810 Age-related osteoporosis without current pathological fracture: Secondary | ICD-10-CM | POA: Diagnosis not present

## 2022-08-30 DIAGNOSIS — F32A Depression, unspecified: Secondary | ICD-10-CM | POA: Diagnosis not present

## 2022-08-30 DIAGNOSIS — Z79899 Other long term (current) drug therapy: Secondary | ICD-10-CM | POA: Diagnosis not present

## 2022-08-30 DIAGNOSIS — I480 Paroxysmal atrial fibrillation: Secondary | ICD-10-CM | POA: Diagnosis not present

## 2022-08-30 DIAGNOSIS — R269 Unspecified abnormalities of gait and mobility: Secondary | ICD-10-CM | POA: Diagnosis not present

## 2022-08-30 DIAGNOSIS — D6869 Other thrombophilia: Secondary | ICD-10-CM | POA: Diagnosis not present

## 2022-08-30 DIAGNOSIS — I129 Hypertensive chronic kidney disease with stage 1 through stage 4 chronic kidney disease, or unspecified chronic kidney disease: Secondary | ICD-10-CM | POA: Diagnosis not present

## 2022-08-30 DIAGNOSIS — E785 Hyperlipidemia, unspecified: Secondary | ICD-10-CM | POA: Diagnosis not present

## 2022-08-30 DIAGNOSIS — D510 Vitamin B12 deficiency anemia due to intrinsic factor deficiency: Secondary | ICD-10-CM | POA: Diagnosis not present

## 2022-08-30 DIAGNOSIS — N184 Chronic kidney disease, stage 4 (severe): Secondary | ICD-10-CM | POA: Diagnosis not present

## 2022-08-30 DIAGNOSIS — G629 Polyneuropathy, unspecified: Secondary | ICD-10-CM | POA: Diagnosis not present

## 2022-08-30 DIAGNOSIS — Z9181 History of falling: Secondary | ICD-10-CM | POA: Diagnosis not present

## 2022-08-30 DIAGNOSIS — Z7901 Long term (current) use of anticoagulants: Secondary | ICD-10-CM | POA: Diagnosis not present

## 2022-09-02 DIAGNOSIS — D510 Vitamin B12 deficiency anemia due to intrinsic factor deficiency: Secondary | ICD-10-CM | POA: Diagnosis not present

## 2022-09-02 DIAGNOSIS — G629 Polyneuropathy, unspecified: Secondary | ICD-10-CM | POA: Diagnosis not present

## 2022-09-02 DIAGNOSIS — M810 Age-related osteoporosis without current pathological fracture: Secondary | ICD-10-CM | POA: Diagnosis not present

## 2022-09-02 DIAGNOSIS — Z9181 History of falling: Secondary | ICD-10-CM | POA: Diagnosis not present

## 2022-09-02 DIAGNOSIS — I129 Hypertensive chronic kidney disease with stage 1 through stage 4 chronic kidney disease, or unspecified chronic kidney disease: Secondary | ICD-10-CM | POA: Diagnosis not present

## 2022-09-02 DIAGNOSIS — E785 Hyperlipidemia, unspecified: Secondary | ICD-10-CM | POA: Diagnosis not present

## 2022-09-02 DIAGNOSIS — I480 Paroxysmal atrial fibrillation: Secondary | ICD-10-CM | POA: Diagnosis not present

## 2022-09-02 DIAGNOSIS — D6869 Other thrombophilia: Secondary | ICD-10-CM | POA: Diagnosis not present

## 2022-09-02 DIAGNOSIS — R269 Unspecified abnormalities of gait and mobility: Secondary | ICD-10-CM | POA: Diagnosis not present

## 2022-09-02 DIAGNOSIS — N184 Chronic kidney disease, stage 4 (severe): Secondary | ICD-10-CM | POA: Diagnosis not present

## 2022-09-02 DIAGNOSIS — Z79899 Other long term (current) drug therapy: Secondary | ICD-10-CM | POA: Diagnosis not present

## 2022-09-02 DIAGNOSIS — Z7901 Long term (current) use of anticoagulants: Secondary | ICD-10-CM | POA: Diagnosis not present

## 2022-09-02 DIAGNOSIS — F32A Depression, unspecified: Secondary | ICD-10-CM | POA: Diagnosis not present

## 2022-09-07 DIAGNOSIS — N184 Chronic kidney disease, stage 4 (severe): Secondary | ICD-10-CM | POA: Diagnosis not present

## 2022-09-07 DIAGNOSIS — Z79899 Other long term (current) drug therapy: Secondary | ICD-10-CM | POA: Diagnosis not present

## 2022-09-07 DIAGNOSIS — R269 Unspecified abnormalities of gait and mobility: Secondary | ICD-10-CM | POA: Diagnosis not present

## 2022-09-07 DIAGNOSIS — G629 Polyneuropathy, unspecified: Secondary | ICD-10-CM | POA: Diagnosis not present

## 2022-09-07 DIAGNOSIS — F32A Depression, unspecified: Secondary | ICD-10-CM | POA: Diagnosis not present

## 2022-09-07 DIAGNOSIS — I480 Paroxysmal atrial fibrillation: Secondary | ICD-10-CM | POA: Diagnosis not present

## 2022-09-07 DIAGNOSIS — Z9181 History of falling: Secondary | ICD-10-CM | POA: Diagnosis not present

## 2022-09-07 DIAGNOSIS — Z7901 Long term (current) use of anticoagulants: Secondary | ICD-10-CM | POA: Diagnosis not present

## 2022-09-07 DIAGNOSIS — I129 Hypertensive chronic kidney disease with stage 1 through stage 4 chronic kidney disease, or unspecified chronic kidney disease: Secondary | ICD-10-CM | POA: Diagnosis not present

## 2022-09-07 DIAGNOSIS — E785 Hyperlipidemia, unspecified: Secondary | ICD-10-CM | POA: Diagnosis not present

## 2022-09-07 DIAGNOSIS — M810 Age-related osteoporosis without current pathological fracture: Secondary | ICD-10-CM | POA: Diagnosis not present

## 2022-09-07 DIAGNOSIS — D510 Vitamin B12 deficiency anemia due to intrinsic factor deficiency: Secondary | ICD-10-CM | POA: Diagnosis not present

## 2022-09-07 DIAGNOSIS — D6869 Other thrombophilia: Secondary | ICD-10-CM | POA: Diagnosis not present

## 2022-09-14 DIAGNOSIS — E785 Hyperlipidemia, unspecified: Secondary | ICD-10-CM | POA: Diagnosis not present

## 2022-09-14 DIAGNOSIS — G629 Polyneuropathy, unspecified: Secondary | ICD-10-CM | POA: Diagnosis not present

## 2022-09-14 DIAGNOSIS — Z7901 Long term (current) use of anticoagulants: Secondary | ICD-10-CM | POA: Diagnosis not present

## 2022-09-14 DIAGNOSIS — D6869 Other thrombophilia: Secondary | ICD-10-CM | POA: Diagnosis not present

## 2022-09-14 DIAGNOSIS — R269 Unspecified abnormalities of gait and mobility: Secondary | ICD-10-CM | POA: Diagnosis not present

## 2022-09-14 DIAGNOSIS — N184 Chronic kidney disease, stage 4 (severe): Secondary | ICD-10-CM | POA: Diagnosis not present

## 2022-09-14 DIAGNOSIS — M810 Age-related osteoporosis without current pathological fracture: Secondary | ICD-10-CM | POA: Diagnosis not present

## 2022-09-14 DIAGNOSIS — I129 Hypertensive chronic kidney disease with stage 1 through stage 4 chronic kidney disease, or unspecified chronic kidney disease: Secondary | ICD-10-CM | POA: Diagnosis not present

## 2022-09-14 DIAGNOSIS — I480 Paroxysmal atrial fibrillation: Secondary | ICD-10-CM | POA: Diagnosis not present

## 2022-09-14 DIAGNOSIS — F32A Depression, unspecified: Secondary | ICD-10-CM | POA: Diagnosis not present

## 2022-09-14 DIAGNOSIS — D510 Vitamin B12 deficiency anemia due to intrinsic factor deficiency: Secondary | ICD-10-CM | POA: Diagnosis not present

## 2022-09-14 DIAGNOSIS — Z79899 Other long term (current) drug therapy: Secondary | ICD-10-CM | POA: Diagnosis not present

## 2022-09-14 DIAGNOSIS — Z9181 History of falling: Secondary | ICD-10-CM | POA: Diagnosis not present

## 2022-09-17 DIAGNOSIS — Z0000 Encounter for general adult medical examination without abnormal findings: Secondary | ICD-10-CM | POA: Diagnosis not present

## 2022-09-21 DIAGNOSIS — D510 Vitamin B12 deficiency anemia due to intrinsic factor deficiency: Secondary | ICD-10-CM | POA: Diagnosis not present

## 2022-09-21 DIAGNOSIS — Z79899 Other long term (current) drug therapy: Secondary | ICD-10-CM | POA: Diagnosis not present

## 2022-09-21 DIAGNOSIS — Z9181 History of falling: Secondary | ICD-10-CM | POA: Diagnosis not present

## 2022-09-21 DIAGNOSIS — G629 Polyneuropathy, unspecified: Secondary | ICD-10-CM | POA: Diagnosis not present

## 2022-09-21 DIAGNOSIS — D6869 Other thrombophilia: Secondary | ICD-10-CM | POA: Diagnosis not present

## 2022-09-21 DIAGNOSIS — F32A Depression, unspecified: Secondary | ICD-10-CM | POA: Diagnosis not present

## 2022-09-21 DIAGNOSIS — R269 Unspecified abnormalities of gait and mobility: Secondary | ICD-10-CM | POA: Diagnosis not present

## 2022-09-21 DIAGNOSIS — Z7901 Long term (current) use of anticoagulants: Secondary | ICD-10-CM | POA: Diagnosis not present

## 2022-09-21 DIAGNOSIS — E785 Hyperlipidemia, unspecified: Secondary | ICD-10-CM | POA: Diagnosis not present

## 2022-09-21 DIAGNOSIS — M810 Age-related osteoporosis without current pathological fracture: Secondary | ICD-10-CM | POA: Diagnosis not present

## 2022-09-21 DIAGNOSIS — I129 Hypertensive chronic kidney disease with stage 1 through stage 4 chronic kidney disease, or unspecified chronic kidney disease: Secondary | ICD-10-CM | POA: Diagnosis not present

## 2022-09-21 DIAGNOSIS — N184 Chronic kidney disease, stage 4 (severe): Secondary | ICD-10-CM | POA: Diagnosis not present

## 2022-09-21 DIAGNOSIS — I480 Paroxysmal atrial fibrillation: Secondary | ICD-10-CM | POA: Diagnosis not present

## 2022-09-24 ENCOUNTER — Other Ambulatory Visit: Payer: Self-pay | Admitting: Cardiology

## 2022-09-28 DIAGNOSIS — Z79899 Other long term (current) drug therapy: Secondary | ICD-10-CM | POA: Diagnosis not present

## 2022-09-28 DIAGNOSIS — D6869 Other thrombophilia: Secondary | ICD-10-CM | POA: Diagnosis not present

## 2022-09-28 DIAGNOSIS — M810 Age-related osteoporosis without current pathological fracture: Secondary | ICD-10-CM | POA: Diagnosis not present

## 2022-09-28 DIAGNOSIS — I129 Hypertensive chronic kidney disease with stage 1 through stage 4 chronic kidney disease, or unspecified chronic kidney disease: Secondary | ICD-10-CM | POA: Diagnosis not present

## 2022-09-28 DIAGNOSIS — N184 Chronic kidney disease, stage 4 (severe): Secondary | ICD-10-CM | POA: Diagnosis not present

## 2022-09-28 DIAGNOSIS — Z9181 History of falling: Secondary | ICD-10-CM | POA: Diagnosis not present

## 2022-09-28 DIAGNOSIS — F32A Depression, unspecified: Secondary | ICD-10-CM | POA: Diagnosis not present

## 2022-09-28 DIAGNOSIS — Z7901 Long term (current) use of anticoagulants: Secondary | ICD-10-CM | POA: Diagnosis not present

## 2022-09-28 DIAGNOSIS — E785 Hyperlipidemia, unspecified: Secondary | ICD-10-CM | POA: Diagnosis not present

## 2022-09-28 DIAGNOSIS — G629 Polyneuropathy, unspecified: Secondary | ICD-10-CM | POA: Diagnosis not present

## 2022-09-28 DIAGNOSIS — R269 Unspecified abnormalities of gait and mobility: Secondary | ICD-10-CM | POA: Diagnosis not present

## 2022-09-28 DIAGNOSIS — D510 Vitamin B12 deficiency anemia due to intrinsic factor deficiency: Secondary | ICD-10-CM | POA: Diagnosis not present

## 2022-09-28 DIAGNOSIS — I480 Paroxysmal atrial fibrillation: Secondary | ICD-10-CM | POA: Diagnosis not present

## 2022-11-08 ENCOUNTER — Other Ambulatory Visit: Payer: Self-pay | Admitting: Cardiology

## 2022-12-16 ENCOUNTER — Ambulatory Visit: Payer: Medicare Other | Admitting: Neurology

## 2022-12-16 ENCOUNTER — Encounter: Payer: Self-pay | Admitting: Neurology

## 2022-12-16 VITALS — BP 157/66 | HR 91 | Ht 62.0 in | Wt 127.8 lb

## 2022-12-16 DIAGNOSIS — G6289 Other specified polyneuropathies: Secondary | ICD-10-CM

## 2022-12-16 NOTE — Patient Instructions (Addendum)
Emg/ncs Bloodwork today Would like to have MRI brain and cervical and lumbar spine  Peripheral Neuropathy Peripheral neuropathy is a type of nerve damage. It affects nerves that carry signals between the spinal cord and the arms, legs, and the rest of the body (peripheral nerves). It does not affect nerves in the spinal cord or brain. In peripheral neuropathy, one nerve or a group of nerves may be damaged. Peripheral neuropathy is a broad category that includes many specific nerve disorders, like diabetic neuropathy, hereditary neuropathy, and carpal tunnel syndrome. What are the causes? This condition may be caused by: Certain diseases, such as: Diabetes. This is the most common cause of peripheral neuropathy. Autoimmune diseases, such as rheumatoid arthritis and systemic lupus erythematosus. Nerve diseases that are passed from parent to child (inherited). Kidney disease. Thyroid disease. Other causes may include: Nerve injury. Pressure or stress on a nerve that lasts a long time. Lack (deficiency) of B vitamins. This can result from alcoholism, poor diet, or a restricted diet. Infections. Some medicines, such as cancer medicines (chemotherapy). Poisonous (toxic) substances, such as lead and mercury. Too little blood flowing to the legs. In some cases, the cause of this condition is not known. What are the signs or symptoms? Symptoms of this condition depend on which of your nerves is damaged. Symptoms in the legs, hands, and arms can include: Loss of feeling (numbness) in the feet, hands, or both. Tingling in the feet, hands, or both. Burning pain. Very sensitive skin. Weakness. Not being able to move a part of the body (paralysis). Clumsiness or poor coordination. Muscle twitching. Loss of balance. Symptoms in other parts of the body can include: Not being able to control your bladder. Feeling dizzy. Sexual problems. How is this diagnosed? Diagnosing and finding the cause  of peripheral neuropathy can be difficult. Your health care provider will take your medical history and do a physical exam. A neurological exam will also be done. This involves checking things that are affected by your brain, spinal cord, and nerves (nervous system). For example, your health care provider will check your reflexes, how you move, and what you can feel. You may have other tests, such as: Blood tests. Electromyogram (EMG) and nerve conduction tests. These tests check nerve function and how well the nerves are controlling the muscles. Imaging tests, such as a CT scan or MRI, to rule out other causes of your symptoms. Removing a small piece of nerve to be examined in a lab (nerve biopsy). Removing and examining a small amount of the fluid that surrounds the brain and spinal cord (lumbar puncture). How is this treated? Treatment for this condition may involve: Treating the underlying cause of the neuropathy, such as diabetes, kidney disease, or vitamin deficiencies. Stopping medicines that can cause neuropathy, such as chemotherapy. Medicine to help relieve pain. Medicines may include: Prescription or over-the-counter pain medicine. Anti-seizure medicine. Antidepressants. Pain-relieving patches that are applied to painful areas of skin. Surgery to relieve pressure on a nerve or to destroy a nerve that is causing pain. Physical therapy to help improve movement and balance. Devices to help you move around (assistive devices). Follow these instructions at home: Medicines Take over-the-counter and prescription medicines only as told by your health care provider. Do not take any other medicines without first asking your health care provider. Ask your health care provider if the medicine prescribed to you requires you to avoid driving or using machinery. Lifestyle  Do not use any products that contain nicotine or  tobacco. These products include cigarettes, chewing tobacco, and vaping  devices, such as e-cigarettes. Smoking keeps blood from reaching damaged nerves. If you need help quitting, ask your health care provider. Avoid or limit alcohol. Too much alcohol can cause a vitamin B deficiency, and vitamin B is needed for healthy nerves. Eat a healthy diet. This includes: Eating foods that are high in fiber, such as beans, whole grains, and fresh fruits and vegetables. Limiting foods that are high in fat and processed sugars, such as fried or sweet foods. General instructions  If you have diabetes, work closely with your health care provider to keep your blood sugar under control. If you have numbness in your feet: Check every day for signs of injury or infection. Watch for redness, warmth, and swelling. Wear padded socks and comfortable shoes. These help protect your feet. Develop a good support system. Living with peripheral neuropathy can be stressful. Consider talking with a mental health specialist or joining a support group. Use assistive devices and attend physical therapy as told by your health care provider. This may include using a walker or a cane. Keep all follow-up visits. This is important. Where to find more information [employer] of Neurological Disorders: [employer] Contact a health care provider if: You have new signs or symptoms of peripheral neuropathy. You are struggling emotionally from dealing with peripheral neuropathy. Your pain is not well controlled. Get help right away if: You have an injury or infection that is not healing normally. You develop new weakness in an arm or leg. You have fallen or do so frequently. Summary Peripheral neuropathy is when the nerves in the arms or legs are damaged, resulting in numbness, weakness, or pain. There are many causes of peripheral neuropathy, including diabetes, pinched nerves, vitamin deficiencies, autoimmune disease, and hereditary conditions. Diagnosing and finding the cause of  peripheral neuropathy can be difficult. Your health care provider will take your medical history, do a physical exam, and do tests, including blood tests and nerve function tests. Treatment involves treating the underlying cause of the neuropathy and taking medicines to help control pain. Physical therapy and assistive devices may also help. This information is not intended to replace advice given to you by your health care provider. Make sure you discuss any questions you have with your health care provider. Document Revised: 02/04/2021 Document Reviewed: 02/04/2021 [employer] Patient Education  2024 [employer].

## 2022-12-16 NOTE — Progress Notes (Addendum)
[facility]    Provider:  Dr Lucia Gaskins Requesting Provider: Emilio Aspen, * Primary Care Provider:  Emilio Aspen, MD  Addendum 12/28/2022: Copper level is 7, recheck at emg/ncs CC:  progressive neuropathy to her knees  HPI:  Tabitha Sanders is a 89 y.o. female here as requested by Emilio Aspen, * for progressive neuropathy past medical history anemia, multiple aneurysms with intervention by Dr. Gavin Pound sure, BPPV, cardiomegaly, cerebral infarction, depression, hypertension, head trauma, hardof hearing, hyperlipidemia, hypertension, paroxysmal A-fib, polyarteritis rheumatica, subarachnoid bleed after syncope and collapse and cerebral infarction.  Here with daughter who provides much information as well. Started stumbling about 2 years ago and progressive with numbness and tingling in the feet. No pain. Numbness up to the knees and hands. Slowly progressive. No pain. A lot of wekaness and imbalance. Stumbling started possibly 2019 or earlier. She falls forward. She can be standing and just fall. No neck pain. No low back pain. Also numbness digits 4-5 on both hands. No neck pain or back pain. No cervical or radicular symptoms. She has pain in both upper right arms. Having difficulty with pain lifting arms up. In legs she is weak and numb and tinglin gand burning. Can't wait too far without sitting down legs start feeling weak. She is in a wheelchair today.   Reviewed notes, labs and imaging from outside physicians, which showed:  2 mm aneurysm of the right cavernous carotid projecting medially unchanged from the prior study.   IMPRESSION: 1. Generalized atrophy. Mild progression of enlargement of the third and lateral ventricles compared with 11/17/2017 suggesting hydrocephalus. The patient had trauma and subarachnoid hemorrhage in June of 2019. 2. Negative for acute infarct. Mild chronic microvascular ischemia in the white matter. 3. Pipeline  stent for aneurysm treatment left petrous carotid. No recurrent aneurysm or intracranial stenosis. 4. 2 mm right cavernous carotid aneurysm unchanged.  Reviewed notes in EPIC to date: - saw Dr. Julieanne Cotton several times in 2016-2020 in interventional radiology for multiple aneurysms, The patient underwent endovascular treatment of a complex irregular left internal carotid artery petrous cavernous aneurysm on 05/19/2017 - in 11/2017 Subarachnoid hemorrhage overlies the frontal lobes bilaterally.Intraventricular blood noted in the lateral ventricles bilaterally,right greater than left. Appeared to have a fall; She was hospitalized in 2019 after syncope complicated by a subarachnoid hemorrhage. Echo showed normal ejection fraction. She was discharged on a monitor showing atrial fibrillation. CT cervical spine showed Degenerative changes in the cervical spine. No acute bony abnormality. - In 2016 had a stroke and a fall:MRI  IMPRESSION:1. Suspected punctate acute infarct in the posterior limb of the right internal capsule.2. Mild chronic small vessel ischemic disease in the cerebral white matter. MRA at that time showed 9 x 6 x 8 mm wide necked aneurysm arising from the cavernous left ICA and in 2016 had embolization of the left common carotid arteriogram followed by endovascular treatment of wide neck large irregular internal carotid artery aneurysm using pipeline flow diverter. - follows with Dr. Elberta Fortis in cardiology for afib  Recent Results (from the past 2160 hour(s))  B12 and Folate Panel     Status: None   Collection Time: 12/16/22  4:01 PM  Result Value Ref Range   Vitamin B-12 768 232 - 1,245 pg/mL   Folate 19.2 >3.0 ng/mL    Comment: A serum folate concentration of less than 3.1 ng/mL is considered to represent clinical deficiency.   Methylmalonic acid, serum     Status: None (Preliminary  result)   Collection Time: 12/16/22  4:01 PM  Result Value Ref Range   Methylmalonic Acid WILL  FOLLOW   Basic Metabolic Panel     Status: Abnormal   Collection Time: 12/16/22  4:01 PM  Result Value Ref Range   Glucose 120 (H) 70 - 99 mg/dL   BUN 22 10 - 36 mg/dL   Creatinine, Ser 2.69 (H) 0.57 - 1.00 mg/dL   eGFR 32 (L) >48 NI/OEV/0.35   BUN/Creatinine Ratio 15 12 - 28   Sodium 141 134 - 144 mmol/L   Potassium 4.3 3.5 - 5.2 mmol/L   Chloride 107 (H) 96 - 106 mmol/L   CO2 18 (L) 20 - 29 mmol/L   Calcium 10.1 8.7 - 10.3 mg/dL  Vitamin B1     Status: None (Preliminary result)   Collection Time: 12/16/22  4:01 PM  Result Value Ref Range   Thiamine WILL FOLLOW   Hemoglobin A1c     Status: None   Collection Time: 12/16/22  4:01 PM  Result Value Ref Range   Hgb A1c MFr Bld 5.0 4.8 - 5.6 %    Comment:          Prediabetes: 5.7 - 6.4          Diabetes: >6.4          Glycemic control for adults with diabetes: <7.0    Est. average glucose Bld gHb Est-mCnc 97 mg/dL  Multiple Myeloma Panel (SPEP&IFE w/QIG)     Status: None (Preliminary result)   Collection Time: 12/16/22  4:01 PM  Result Value Ref Range   IgG (Immunoglobin G), Serum WILL FOLLOW    IgA/Immunoglobulin A, Serum WILL FOLLOW    IgM (Immunoglobulin M), Srm WILL FOLLOW    Total Protein WILL FOLLOW    Albumin SerPl Elph-Mcnc WILL FOLLOW    Alpha 1 WILL FOLLOW    Alpha2 Glob SerPl Elph-Mcnc WILL FOLLOW    B-Globulin SerPl Elph-Mcnc WILL FOLLOW    Gamma Glob SerPl Elph-Mcnc WILL FOLLOW    M Protein SerPl Elph-Mcnc WILL FOLLOW    Globulin, Total WILL FOLLOW    Albumin/Glob SerPl WILL FOLLOW    IFE 1 WILL FOLLOW    Please Note WILL FOLLOW   Sjogren's syndrome antibods(ssa + ssb)     Status: None   Collection Time: 12/16/22  4:01 PM  Result Value Ref Range   ENA SSA (RO) Ab <0.2 0.0 - 0.9 AI   ENA SSB (LA) Ab <0.2 0.0 - 0.9 AI  Rheumatoid factor     Status: None   Collection Time: 12/16/22  4:01 PM  Result Value Ref Range   Rheumatoid fact SerPl-aCnc <10.0 <14.0 IU/mL  Heavy metals, blood     Status: None    Collection Time: 12/16/22  4:01 PM  Result Value Ref Range   Lead, Blood 1.8 0.0 - 3.4 ug/dL    Comment: Testing performed by Inductively coupled Administrator, [employer].                           Environmental Exposure:                            WHO Recommendation     <5.0                           Occupational Exposure:  OSHA Lead Std          40.0                            BEI                    30.0                                 Detection Limit =  1.0    Arsenic 2 0 - 9 ug/L    Comment:                                 Detection Limit = 1   Mercury <1.0 0.0 - 14.9 ug/L    Comment:                                 Detection Limit =  1.0  Vitamin B6     Status: None   Collection Time: 12/16/22  4:01 PM  Result Value Ref Range   Vitamin B6 10.7 3.4 - 65.2 ug/L    Comment:                              Deficiency:         <3.4                              Marginal:      3.4 - 5.1                              Adequate:           >5.1   Vitamin E     Status: Abnormal   Collection Time: 12/16/22  4:01 PM  Result Value Ref Range   Vitamin E (Alpha Tocopherol) 25.1 9.0 - 29.0 mg/L   Vitamin E(Gamma Tocopherol) 0.4 (L) 0.5 - 4.9 mg/L    Comment: Reference intervals for alpha and gamma-tocopherol determined from [facility] and Nutrition Examination Survey, 2005-2006. Individuals with alpha-tocopherol levels less than 5.0 mg/L are considered vitamin E deficient.   Copper, serum     Status: None (Preliminary result)   Collection Time: 12/16/22  4:01 PM  Result Value Ref Range   Copper WILL FOLLOW      Review of Systems: Patient complains of symptoms per HPI as well as the following symptoms numbness/sensory changes lower extremities. Pertinent negatives and positives per HPI. All others negative.   Social History   Socioeconomic History   Marital status: Widowed    Spouse name: Not on file   Number of children: Not on file   Years of  education: Not on file   Highest education level: Not on file  Occupational History   Not on file  Tobacco Use   Smoking status: Never   Smokeless tobacco: Never  Vaping Use   Vaping Use: Never used  Substance and Sexual Activity   Alcohol use: No   Drug use: No   Sexual activity: Not on file  Other Topics Concern   Not on file  Social History Narrative   **  Merged History Encounter **       Social Determinants of Health   Financial Resource Strain: Not on file  Food Insecurity: Not on file  Transportation Needs: Not on file  Physical Activity: Not on file  Stress: Not on file  Social Connections: Not on file  Intimate Partner Violence: Not on file    Family History  Problem Relation Age of Onset   Stroke Mother    CVA Mother    Stroke Sister    CVA Sister     Past Medical History:  Diagnosis Date   Anemia    Anemia    iron deficiency anemia and pernicious   Aneurysm, carotid artery, internal    Benign paroxysmal positional vertigo    Brain aneurysm 05/20/2015   Cardiomegaly 11/17/2017   Cerebral infarction due to occlusion of other cerebral artery (HCC)    Depression    Essential hypertension 11/17/2017   H/O cerebral aneurysm repair 2016   Head trauma 11/17/2017   HOH (hard of hearing)    wears bilateral hearing aids   Hyperlipidemia 11/17/2017   Hypertension    Intracranial aneurysm    Paroxysmal atrial fibrillation (HCC) 12/13/2017   Polyarthritis rheumatica (HCC)    Stroke (HCC)    Stroke (HCC) January 25, 2015   No deficits   Subarachnoid bleed (HCC) 11/17/2017   Syncope and collapse 11/17/2017    Patient Active Problem List   Diagnosis Date Noted   Paroxysmal atrial fibrillation (HCC) 12/13/2017   Subarachnoid bleed (HCC) 11/17/2017   Head trauma 11/17/2017   Essential hypertension 11/17/2017   Syncope and collapse 11/17/2017   Hyperlipidemia 11/17/2017   Cardiomegaly 11/17/2017   Brain aneurysm 05/20/2015   Intracranial aneurysm    Aneurysm,  carotid artery, internal    Cerebral infarction due to occlusion of other cerebral artery (HCC)    Benign paroxysmal positional vertigo    Stroke (HCC) 01/25/2015    Past Surgical History:  Procedure Laterality Date   ABDOMINAL HYSTERECTOMY     complete   BREAST SURGERY     biopsy- bilateral- benign   COLONOSCOPY WITH PROPOFOL N/A 08/20/2014   Procedure: COLONOSCOPY WITH PROPOFOL;  Surgeon: Reece Agar, MD;  Location: [facility];  Service: Endoscopy;  Laterality: N/A;   EYE SURGERY     bilateral cataract with lens implant   IR RADIOLOGIST EVAL & MGMT  12/14/2017   RADIOLOGY WITH ANESTHESIA N/A 05/20/2015   Procedure: RADIOLOGY WITH ANESTHESIA;  Surgeon: Julieanne Cotton, MD;  Location: [facility] OR;  Service: Radiology;  Laterality: N/A;    Current Outpatient Medications  Medication Sig Dispense Refill   acetaminophen (TYLENOL) 500 MG tablet Take 500 mg by mouth every 6 (six) hours as needed for mild pain.     amLODipine (NORVASC) 10 MG tablet Take 10 mg by mouth daily.  6   atorvastatin (LIPITOR) 40 MG tablet Take 40 mg by mouth every evening.  3   Cyanocobalamin (VITAMIN B-12 PO) Take 1 tablet by mouth 2 (two) times a week. Take on Mondays and Wednesdays     dronedarone (MULTAQ) 400 MG tablet TAKE 1 TABLET BY MOUTH TWICE DAILY WITH A MEAL 180 tablet 3   DULoxetine (CYMBALTA) 30 MG capsule Take 30 mg by mouth daily.  11   ELIQUIS 5 MG TABS tablet TAKE 1 TABLET(5 MG) BY MOUTH TWICE DAILY 180 tablet 1   ergocalciferol (VITAMIN D2) 50000 UNITS capsule Take 50,000 Units by mouth once a week. Friday     ferrous  sulfate 325 (65 FE) MG tablet Take 325 mg by mouth 3 (three) times a week. Take on Monday Wednesday Friday     hydrALAZINE (APRESOLINE) 50 MG tablet TAKE 1 TABLET(50 MG) BY MOUTH THREE TIMES DAILY 270 tablet 3   irbesartan (AVAPRO) 150 MG tablet Take 75 mg by mouth daily.     Melatonin 5 MG TABS Take 5 mg by mouth at bedtime as needed (for sleep).     Multiple Vitamins-Minerals  (PRESERVISION AREDS PO) Take 1 capsule by mouth 2 (two) times daily.     latanoprost (XALATAN) 0.005 % ophthalmic solution Place 1 drop into both eyes at bedtime. (Patient not taking: Reported on 12/16/2022)     LUMIGAN 0.01 % SOLN Place 1 drop into both eyes at bedtime. (Patient not taking: Reported on 12/16/2022)     No current facility-administered medications for this visit.    Allergies as of 12/16/2022 - Review Complete 12/16/2022  Allergen Reaction Noted   Codeine Nausea Only 05/07/2021   Lidocaine-epinephrine Other (See Comments) 05/20/2015   Lisinopril Cough 11/17/2017   Lisinopril Other (See Comments) 08/17/2014    Vitals: BP (!) 157/66 (BP Location: Left Arm, Patient Position: Sitting, Cuff Size: Small)   Pulse 91   Ht 5\' 2"  (1.575 m)   Wt 127 lb 12.8 oz (58 kg)   BMI 23.37 kg/m  Last Weight:  Wt Readings from Last 1 Encounters:  12/16/22 127 lb 12.8 oz (58 kg)   Last Height:   Ht Readings from Last 1 Encounters:  12/16/22 5\' 2"  (1.575 m)     Physical exam: Exam: Gen: NAD, conversant, thin and frail appearing                CV: RRR,  No peripheral edema, warm, nontender. +murmur Eyes: Conjunctivae clear without exudates or hemorrhage  Neuro: Detailed Neurologic Exam  Speech:    Speech is normal; fluent and spontaneous with normal comprehension.  Cognition:    The patient is oriented to person, place, and time;     recent and remote memory intact;     language fluent;     normal attention, concentration,     fund of knowledge Cranial Nerves:    The pupils are equal, round, and reactive to light. Pupils too small to evaluate fundi, attempted Visual fields appear full. Extraocular movements are intact. Trigeminal sensation is intact and the muscles of mastication are normal. The face is symmetric. The palate elevates in the midline. Hearing intact. Voice is normal. Shoulder shrug is normal. The tongue has normal motion without fasciculations.   Coordination:  ftn, HTS nml     Gait:    Narrow based, low clearance, imbalanced ataxic  Motor Observation:    No asymmetry, no atrophy, and no involuntary movements noted. Tone:    Normal muscle tone.    Posture:    Posture is normal. normal erect    Strength:    Strength is V/V in the upper and lower limbs.      Sensation: decreased temp to mid calves and pin prick possibly to the knees on both, vibration few seconds at medial malleolus, proprioception impaired at the great toes.      Reflex Exam:  DTR's:    Clonus patellars, absent AJs, brisk uppers   Toes:    The toes are equiv bilaterally.   Clonus:    Clonus patellars, absent AJs, brisk uppers     Assessment/Plan:  Tabitha Sanders is a 89 y.o. female here  as requested by Emilio Aspen, * for progressive neuropathy has Stroke [facility]); Cerebral infarction due to occlusion of other cerebral artery (HCC); Benign paroxysmal positional vertigo; Aneurysm, carotid artery, internal; Intracranial aneurysm; Brain aneurysm; Subarachnoid bleed (HCC); Head trauma; Essential hypertension; Syncope and collapse; Hyperlipidemia; Cardiomegaly; and Paroxysmal atrial fibrillation (HCC) on their problem list. Exam with decrease to all modalities distally in the feet to mid calf possibly to the knees. Narrow based gait, low clearance, imbalanced ataxic, strength intact. Multiple falls ongoing for many years. We will order serum labs and emg/ncs, she declines any other imaging such as her brain or low back.  - progressive painless neuropathy to the knees. She states about 2 years but review of chart appears to have started long prior. Unusual is that it is painless. - Prior MRI 2020 showed 2 mm aneurysm of the right cavernous carotid projecting medially unchanged from the prior study, Generalized atrophy, Mild progression of enlargement of the third and lateral ventricles compared with 11/17/2017 suggesting hydrocephalus. The patient had trauma and  subarachnoid hemorrhage in June of 2019; She was hospitalized in 2019 after syncope complicated by a subarachnoid hemorrhage. Echo showed normal ejection fraction. She was discharged on a monitor showing atrial fibrillation.  - SHE DECLINES REPEAT MRI  and MRA head despite possible NPH/hydrocephalus and aneurysm(multiple in the past see below), at this time she only wants to address her lower extremity numbness. Otherwise mild white matter and 2mm right cavernous carotid aneurysm unchanged on 2020 MRI. (In 2019 apparently fell with bifrontal subarachnoid hemorrhage resolved_ - DECLINES mri lumbar spine - we discussed causes of neuropathy and will test her with serum labs and emg/ncs - saw Dr. Julieanne Cotton several times in 2016-2020 in interventional radiology for multiple aneurysms, The patient underwent endovascular treatment of a complex irregular left internal carotid artery petrous cavernous aneurysm on 05/19/2017 - in 11/2017 Subarachnoid hemorrhage overlies the frontal lobes bilaterally.Intraventricular blood noted in the lateral ventricles bilaterally,right greater than left. Appeared to have a fall; She was hospitalized in 2019 after syncope complicated by a subarachnoid hemorrhage. Echo showed normal ejection fraction. She was discharged on a monitor showing atrial fibrillation. CT cervical spine showed Degenerative changes in the cervical spine. No acute bony abnormality. - In 2016 had a stroke and a fall:MRI  IMPRESSION:1. Suspected punctate acute infarct in the posterior limb of the right internal capsule.2. Mild chronic small vessel ischemic disease in the cerebral white matter. MRA at that time showed 9 x 6 x 8 mm wide necked aneurysm arising from the cavernous left ICA and in 2016 had embolization of the left common carotid arteriogram followed by endovascular treatment of wide neck large irregular internal carotid artery aneurysm using pipeline flow diverter. - follows with Dr. Elberta Fortis in  cardiology for afib   Orders Placed This Encounter  Procedures   B12 and Folate Panel   Methylmalonic acid, serum   Basic Metabolic Panel   Vitamin B1   Hemoglobin A1c   Multiple Myeloma Panel (SPEP&IFE w/QIG)   Sjogren's syndrome antibods(ssa + ssb)   Rheumatoid factor   Heavy metals, blood   Vitamin B6   Vitamin E   Copper, serum   NCV with EMG(electromyography)     Cc: Carolin Coy, MD  Naomie Dean, MD  [facility] [address] [place], [place] [address]  Phone [phone] Fax [phone]

## 2022-12-17 LAB — BASIC METABOLIC PANEL
BUN/Creatinine Ratio: 15 (ref 12–28)
BUN: 22 mg/dL (ref 10–36)
CO2: 18 mmol/L — ABNORMAL LOW (ref 20–29)
Creatinine, Ser: 1.51 mg/dL — ABNORMAL HIGH (ref 0.57–1.00)
eGFR: 32 mL/min/{1.73_m2} — ABNORMAL LOW (ref >59)

## 2022-12-17 LAB — MULTIPLE MYELOMA PANEL, SERUM

## 2022-12-17 LAB — SJOGREN'S SYNDROME ANTIBODS(SSA + SSB): ENA SSB (LA) Ab: <0.2 AI (ref 0.0–0.9)

## 2022-12-17 LAB — VITAMIN B1

## 2022-12-17 LAB — METHYLMALONIC ACID, SERUM

## 2022-12-17 LAB — HEMOGLOBIN A1C: Hgb A1c MFr Bld: 5 % (ref 4.8–5.6)

## 2022-12-17 LAB — HEAVY METALS, BLOOD

## 2022-12-18 LAB — HEAVY METALS, BLOOD: Mercury: <1 ug/L (ref 0.0–14.9)

## 2022-12-18 LAB — MULTIPLE MYELOMA PANEL, SERUM

## 2022-12-18 LAB — VITAMIN E

## 2022-12-18 LAB — BASIC METABOLIC PANEL: Calcium: 10.1 mg/dL (ref 8.7–10.3)

## 2022-12-18 LAB — SJOGREN'S SYNDROME ANTIBODS(SSA + SSB): ENA SSA (RO) Ab: <0.2 AI (ref 0.0–0.9)

## 2022-12-20 LAB — BASIC METABOLIC PANEL: Glucose: 120 mg/dL — ABNORMAL HIGH (ref 70–99)

## 2022-12-20 LAB — COPPER, SERUM

## 2022-12-20 LAB — VITAMIN E: Vitamin E(Gamma Tocopherol): 0.4 mg/L — ABNORMAL LOW (ref 0.5–4.9)

## 2022-12-20 LAB — B12 AND FOLATE PANEL: Folate: 19.2 ng/mL (ref >3.0)

## 2022-12-20 LAB — MULTIPLE MYELOMA PANEL, SERUM

## 2022-12-21 LAB — HEMOGLOBIN A1C: Est. average glucose Bld gHb Est-mCnc: 97 mg/dL

## 2022-12-21 LAB — BASIC METABOLIC PANEL
Chloride: 107 mmol/L — ABNORMAL HIGH (ref 96–106)
Sodium: 141 mmol/L (ref 134–144)

## 2022-12-21 LAB — HEAVY METALS, BLOOD: Lead, Blood: 1.8 ug/dL (ref 0.0–3.4)

## 2022-12-21 LAB — MULTIPLE MYELOMA PANEL, SERUM

## 2022-12-22 LAB — BASIC METABOLIC PANEL: Potassium: 4.3 mmol/L (ref 3.5–5.2)

## 2022-12-22 LAB — MULTIPLE MYELOMA PANEL, SERUM

## 2022-12-22 LAB — VITAMIN B6: Vitamin B6: 10.7 ug/L (ref 3.4–65.2)

## 2022-12-23 LAB — MULTIPLE MYELOMA PANEL, SERUM

## 2022-12-23 LAB — B12 AND FOLATE PANEL: Vitamin B-12: 768 pg/mL (ref 232–1245)

## 2022-12-23 LAB — RHEUMATOID FACTOR: Rheumatoid fact SerPl-aCnc: <10 IU/mL (ref <14.0)

## 2023-01-12 ENCOUNTER — Ambulatory Visit: Payer: Medicare Other | Attending: Cardiology | Admitting: Cardiology

## 2023-01-12 ENCOUNTER — Encounter: Payer: Self-pay | Admitting: Cardiology

## 2023-01-12 VITALS — BP 154/52 | HR 78 | Ht 62.0 in | Wt 125.8 lb

## 2023-01-12 DIAGNOSIS — D6869 Other thrombophilia: Secondary | ICD-10-CM

## 2023-01-12 DIAGNOSIS — I480 Paroxysmal atrial fibrillation: Secondary | ICD-10-CM

## 2023-01-12 DIAGNOSIS — I10 Essential (primary) hypertension: Secondary | ICD-10-CM | POA: Diagnosis not present

## 2023-01-12 NOTE — Patient Instructions (Signed)
Medication Instructions:  Your physician recommends that you continue on your current medications as directed. Please refer to the Current Medication list given to you today.  *If you need a refill on your cardiac medications before your next appointment, please call your pharmacy*   Lab Work: None ordered   Testing/Procedures: None ordered   Follow-Up: At [facility], you and your health needs are our priority.  As part of our continuing mission to provide you with exceptional heart care, we have created designated Provider Care Teams.  These Care Teams include your primary Cardiologist (physician) and Advanced Practice Providers (APPs -  Physician Assistants and Nurse Practitioners) who all work together to provide you with the care you need, when you need it.  Your next appointment:   6 month(s)  The format for your next appointment:   In Person  Provider:   You will see one of the following Advanced Practice Providers on your designated Care Team:   Francis Dowse, [place] "Mardelle Matte" [place], [place] Canary Brim, NP   Thank you for choosing [facility]!!   Dory Horn, RN [phone]

## 2023-01-12 NOTE — Progress Notes (Signed)
  Electrophysiology Office Note:   Date:  01/12/2023  ID:  Tabitha Sanders, DOB [id number], MRN [id number]  Primary Cardiologist: Nicki Guadalajara, MD Electrophysiologist: Regan Lemming, MD      History of Present Illness:   Tabitha Sanders is a 89 y.o. female with h/o atrial fibrillation seen today for routine electrophysiology followup.  Since last being seen in our clinic the patient reports doing well from a cardiac perspective.  She notes no further episodes of atrial fibrillation.  She continues to do all her daily activities without restriction.  She is quite happy with her control.  She has been having weakness in her legs and intermittent falls due to weakness.  She understands that this is likely multifactorial from her age and neuropathy.  she denies chest pain, palpitations, dyspnea, PND, orthopnea, nausea, vomiting, dizziness, syncope, edema, weight gain, or early satiety.   Review of systems complete and found to be negative unless listed in HPI.   EP Information / Studies Reviewed:    EKG is ordered today. Personal review as below.  EKG Interpretation Date/Time:  Tuesday January 12 2023 15:54:28 EDT Ventricular Rate:  78 PR Interval:  172 QRS Duration:  84 QT Interval:  414 QTC Calculation: 471 R Axis:   66  Text Interpretation: Normal sinus rhythm with sinus arrhythmia Left ventricular hypertrophy with repolarization abnormality ( Sokolow-Lyon , Romhilt-Estes ) When compared with ECG of 25-Jan-2015 12:22, No significant change since last tracing Confirmed by Deavion Strider ([id number]) on 01/12/2023 3:57:24 PM     Risk Assessment/Calculations:    CHA2DS2-VASc Score = 6   This indicates a 9.7% annual risk of stroke. The patient's score is based upon: CHF History: 0 HTN History: 1 Diabetes History: 0 Stroke History: 2 Vascular Disease History: 0 Age Score: 2 Gender Score: 1      Physical Exam:   VS:  BP (!) 154/52 (BP Location: Right Arm, Patient Position:  Sitting, Cuff Size: Normal)   Pulse 78   Ht 5\' 2"  (1.575 m)   Wt 125 lb 12.8 oz (57.1 kg)   SpO2 98%   BMI 23.01 kg/m    Wt Readings from Last 3 Encounters:  01/12/23 125 lb 12.8 oz (57.1 kg)  12/16/22 127 lb 12.8 oz (58 kg)  08/20/22 135 lb 3.2 oz (61.3 kg)     GEN: Well nourished, well developed in no acute distress NECK: No JVD; No carotid bruits CARDIAC: Regular rate and rhythm, no murmurs, rubs, gallops RESPIRATORY:  Clear to auscultation without rales, wheezing or rhonchi  ABDOMEN: Soft, non-tender, non-distended EXTREMITIES:  No edema; No deformity   ASSESSMENT AND PLAN:    1.  Paroxysmal atrial fibrillation: Currently on Multaq and Eliquis.  She remains in sinus rhythm.  No obvious episodes of atrial fibrillation.  Tabitha Sanders continue with current management.  2.  Hypertension: Plan per primary physician.  Allowing some permissive hypertension due to age and history of falls  3.  Sick sinus syndrome: Occurred post cardioversion.  No further episodes.  Continue current management.  4.  Secondary hypercoagulable state: Currently on Eliquis for atrial fibrillation  Follow up with EP APP 6 months  Signed, [place] Jorja Loa, MD

## 2023-01-18 ENCOUNTER — Ambulatory Visit: Payer: Medicare Other | Admitting: Neurology

## 2023-01-18 DIAGNOSIS — E610 Copper deficiency: Secondary | ICD-10-CM | POA: Diagnosis not present

## 2023-01-18 DIAGNOSIS — G6289 Other specified polyneuropathies: Secondary | ICD-10-CM | POA: Diagnosis not present

## 2023-01-19 NOTE — Procedures (Signed)
Full Name: Tabitha Sanders Gender: Female MRN #: [id number] Date of Birth: 02-13-1926    Visit Date: 01/18/2023 15:14 Age: 89 Years Examining Physician: Dr. Naomie Dean Referring Physician: Dr. Naomie Dean Height: 5 feet 2 inch    History: Patient with neuropathy in the distal lower extremities. No back pain or radicular symptoms.  Summary: NCS performed on bilateral lower extremities: The right peroneal motor nerve conduction showed no response.  The left peroneal motor nerve conduction showed decreased amplitude (1.1 mV, normal greater than 2).  The right tibial motor nerve showed decreased amplitude (3.3 mV, normal greater than 4).  The left tibial motor nerve showed decreased amplitude (3.4, normal greater than 4).  The right radial sensory nerve showed decreased amplitude (7 V, normal greater than 15).  The bilateral sural sensory conductions and bilateral superficial peroneal sensory conductions showed no response. All remaining nerves (as indicated in the following tables) were within normal limits.  EMG nerve conduction study was performed on the left lower extremity: The abductor hallucis muscle showed spontaneous activity, diminished motor unit recruitment and giant motor units. All remaining muscles (as indicated in the following tables) were within normal limits.    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 Decreased radial sensory amplitude in the upper limb suggests some upper extremity involvement. Patient declines further evaluation.   Naomie Dean, M.D. [facility] [address] [place], [place] [address] Tel: [phone] Fax: [phone]  Verbal informed consent was obtained from the patient, patient was informed of potential risk of procedure, including bruising, bleeding, hematoma formation, infection, muscle weakness, muscle pain, numbness, among others.         MNC    Nerve / Sites Muscle Latency Ref. Amplitude Ref. Rel Amp Segments Distance Velocity Ref. Area    ms ms mV mV %  cm m/s m/s mVms  R Peroneal - EDB     Ankle EDB NR ?6.5 NR ?2.0 NR Ankle - EDB 9   NR         Pop fossa - Ankle      L Peroneal - EDB     Ankle EDB 4.3 ?6.5 1.1 ?2.0 100 Ankle - EDB 9   2.4     Fib head EDB 10.7  0.8  72.1 Fib head - Ankle 26.6 42 ?44 1.8     Pop fossa EDB 12.7  1.3  168 Pop fossa - Fib head 9 44 ?44 3.0         Pop fossa - Ankle      R Tibial - AH     Ankle AH 4.3 ?5.8 3.3 ?4.0 100 Ankle - AH 9   8.4     Pop fossa AH 11.2  2.4  72.3 Pop fossa - Ankle 31 45 ?41 6.2  L Tibial - AH     Ankle AH 5.4 ?5.8 3.4 ?4.0 100 Ankle - AH 9   7.9     Pop fossa AH 11.4  2.4  71.9 Pop fossa - Ankle 32 53 ?41 6.5             SNC    Nerve / Sites Rec. Site Peak Lat Ref.  Amp Ref. Segments Distance    ms ms V V  cm  R Radial - Anatomical snuff box (Forearm)     Forearm Wrist 2.5 ?2.9 7 ?15 Forearm - Wrist 10  R Sural -  Ankle (Calf)     Calf Ankle NR ?4.4 NR ?6 Calf - Ankle 14  L Sural - Ankle (Calf)     Calf Ankle NR ?4.4 NR ?6 Calf - Ankle 14  R Superficial peroneal - Ankle     Lat leg Ankle NR ?4.4 NR ?6 Lat leg - Ankle 14  L Superficial peroneal - Ankle     Lat leg Ankle NR ?4.4 NR ?6 Lat leg - Ankle 14               F  Wave    Nerve F Lat Ref.   ms ms  R Tibial - AH 49.7 ?56.0  L Tibial - AH 44.2 ?56.0         EMG Summary Table    Spontaneous MUAP Recruitment  Muscle IA Fib PSW Fasc Other Amp Dur. Poly Pattern  L. Vastus medialis Normal None None None _______ Normal Normal Normal Normal  L. Gastrocnemius (Medial head) Normal None None None _______ Normal Normal Normal Normal  L. Tibialis anterior Normal None None None _______ Normal Normal Normal Normal  L. Extensor hallucis longus Normal None None None _______ Normal Normal Normal Normal  L. Abductor hallucis Normal None 4+ None _______ Giant Normal Normal Single

## 2023-01-19 NOTE — Progress Notes (Signed)
Full Name: Tabitha Sanders Gender: Female MRN #: [id number] Date of Birth: 02-13-1926    Visit Date: 01/18/2023 15:14 Age: 89 Years Examining Physician: Dr. Naomie Dean Referring Physician: Dr. Naomie Dean Height: 5 feet 2 inch    History: Patient with neuropathy in the distal lower extremities. No back pain or radicular symptoms.  Summary: NCS performed on bilateral lower extremities: The right peroneal motor nerve conduction showed no response.  The left peroneal motor nerve conduction showed decreased amplitude (1.1 mV, normal greater than 2).  The right tibial motor nerve showed decreased amplitude (3.3 mV, normal greater than 4).  The left tibial motor nerve showed decreased amplitude (3.4, normal greater than 4).  The right radial sensory nerve showed decreased amplitude (7 V, normal greater than 15).  The bilateral sural sensory conductions and bilateral superficial peroneal sensory conductions showed no response. All remaining nerves (as indicated in the following tables) were within normal limits.  EMG nerve conduction study was performed on the left lower extremity: The abductor hallucis muscle showed spontaneous activity, diminished motor unit recruitment and giant motor units. All remaining muscles (as indicated in the following tables) were within normal limits.    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 Decreased radial sensory amplitude in the upper limb suggests some upper extremity involvement. Patient declines further evaluation.   Naomie Dean, M.D. [facility] [address] [place], [place] [address] Tel: [phone] Fax: [phone]  Verbal informed consent was obtained from the patient, patient was informed of potential risk of procedure, including bruising, bleeding, hematoma formation, infection, muscle weakness, muscle pain, numbness, among others.         MNC    Nerve / Sites Muscle Latency Ref. Amplitude Ref. Rel Amp Segments Distance Velocity Ref. Area    ms ms mV mV %  cm m/s m/s mVms  R Peroneal - EDB     Ankle EDB NR ?6.5 NR ?2.0 NR Ankle - EDB 9   NR         Pop fossa - Ankle      L Peroneal - EDB     Ankle EDB 4.3 ?6.5 1.1 ?2.0 100 Ankle - EDB 9   2.4     Fib head EDB 10.7  0.8  72.1 Fib head - Ankle 26.6 42 ?44 1.8     Pop fossa EDB 12.7  1.3  168 Pop fossa - Fib head 9 44 ?44 3.0         Pop fossa - Ankle      R Tibial - AH     Ankle AH 4.3 ?5.8 3.3 ?4.0 100 Ankle - AH 9   8.4     Pop fossa AH 11.2  2.4  72.3 Pop fossa - Ankle 31 45 ?41 6.2  L Tibial - AH     Ankle AH 5.4 ?5.8 3.4 ?4.0 100 Ankle - AH 9   7.9     Pop fossa AH 11.4  2.4  71.9 Pop fossa - Ankle 32 53 ?41 6.5             SNC    Nerve / Sites Rec. Site Peak Lat Ref.  Amp Ref. Segments Distance    ms ms V V  cm  R Radial - Anatomical snuff box (Forearm)     Forearm Wrist 2.5 ?2.9 7 ?15 Forearm - Wrist 10  R Sural -  Ankle (Calf)     Calf Ankle NR ?4.4 NR ?6 Calf - Ankle 14  L Sural - Ankle (Calf)     Calf Ankle NR ?4.4 NR ?6 Calf - Ankle 14  R Superficial peroneal - Ankle     Lat leg Ankle NR ?4.4 NR ?6 Lat leg - Ankle 14  L Superficial peroneal - Ankle     Lat leg Ankle NR ?4.4 NR ?6 Lat leg - Ankle 14               F  Wave    Nerve F Lat Ref.   ms ms  R Tibial - AH 49.7 ?56.0  L Tibial - AH 44.2 ?56.0         EMG Summary Table    Spontaneous MUAP Recruitment  Muscle IA Fib PSW Fasc Other Amp Dur. Poly Pattern  L. Vastus medialis Normal None None None _______ Normal Normal Normal Normal  L. Gastrocnemius (Medial head) Normal None None None _______ Normal Normal Normal Normal  L. Tibialis anterior Normal None None None _______ Normal Normal Normal Normal  L. Extensor hallucis longus Normal None None None _______ Normal Normal Normal Normal  L. Abductor hallucis Normal None 4+ None _______ Giant Normal Normal Single

## 2023-01-21 ENCOUNTER — Telehealth: Payer: Self-pay | Admitting: Neurology

## 2023-01-21 NOTE — Telephone Encounter (Signed)
Contacted pt, spoke to her and son Tabitha Sanders. Informed them she has a extremely copper deficient. This can actually cause neuropathy and a lot of other problems. May be the cause of her symptoms. Dr Lucia Gaskins checked in with hematology because this is very rare to have a copper deficiency. There is no prescription for copper; they have to go to [facility]vitamin store and get it there  Each capsule is typically 4 mg. Suggested her to take BID and recheck in 3 months. They verbally understood and was appreciative. Son will order for patient

## 2023-01-21 NOTE — Telephone Encounter (Signed)
Patient is extremely copper deficient. This can actually cause neuropathy and a lot of other problems. May be the cause of her symptoms. I checked in with hematology because this is very rare to have a copper deficiency. There is no prescription for copper; they have to go to [facility] store and get it there  Each capsule is typically 4 mg. I would suggest her to take BID and recheck in 3 months

## 2023-02-15 ENCOUNTER — Other Ambulatory Visit: Payer: Self-pay | Admitting: Cardiovascular Disease

## 2023-02-15 DIAGNOSIS — I480 Paroxysmal atrial fibrillation: Secondary | ICD-10-CM

## 2023-02-16 NOTE — Telephone Encounter (Signed)
Prescription refill request for Eliquis received. Indication:afib Last office visit:7/24 Scr:1.51  7/24 Age: 89 Weight:57.1  kg  Under review

## 2023-03-03 DIAGNOSIS — Z888 Allergy status to other drugs, medicaments and biological substances status: Secondary | ICD-10-CM | POA: Diagnosis not present

## 2023-03-03 DIAGNOSIS — R32 Unspecified urinary incontinence: Secondary | ICD-10-CM | POA: Diagnosis not present

## 2023-03-03 DIAGNOSIS — M810 Age-related osteoporosis without current pathological fracture: Secondary | ICD-10-CM | POA: Diagnosis not present

## 2023-03-03 DIAGNOSIS — H3530 Unspecified macular degeneration: Secondary | ICD-10-CM | POA: Diagnosis not present

## 2023-03-03 DIAGNOSIS — Z7983 Long term (current) use of bisphosphonates: Secondary | ICD-10-CM | POA: Diagnosis not present

## 2023-03-03 DIAGNOSIS — E785 Hyperlipidemia, unspecified: Secondary | ICD-10-CM | POA: Diagnosis not present

## 2023-03-03 DIAGNOSIS — I10 Essential (primary) hypertension: Secondary | ICD-10-CM | POA: Diagnosis not present

## 2023-03-03 DIAGNOSIS — Z7901 Long term (current) use of anticoagulants: Secondary | ICD-10-CM | POA: Diagnosis not present

## 2023-03-19 DIAGNOSIS — I129 Hypertensive chronic kidney disease with stage 1 through stage 4 chronic kidney disease, or unspecified chronic kidney disease: Secondary | ICD-10-CM | POA: Diagnosis not present

## 2023-03-19 DIAGNOSIS — Z23 Encounter for immunization: Secondary | ICD-10-CM | POA: Diagnosis not present

## 2023-03-19 DIAGNOSIS — D6869 Other thrombophilia: Secondary | ICD-10-CM | POA: Diagnosis not present

## 2023-03-19 DIAGNOSIS — I480 Paroxysmal atrial fibrillation: Secondary | ICD-10-CM | POA: Diagnosis not present

## 2023-03-19 DIAGNOSIS — F3340 Major depressive disorder, recurrent, in remission, unspecified: Secondary | ICD-10-CM | POA: Diagnosis not present

## 2023-04-05 DIAGNOSIS — H353122 Nonexudative age-related macular degeneration, left eye, intermediate dry stage: Secondary | ICD-10-CM | POA: Diagnosis not present

## 2023-04-05 DIAGNOSIS — H353111 Nonexudative age-related macular degeneration, right eye, early dry stage: Secondary | ICD-10-CM | POA: Diagnosis not present

## 2023-04-13 DIAGNOSIS — H35423 Microcystoid degeneration of retina, bilateral: Secondary | ICD-10-CM | POA: Diagnosis not present

## 2023-04-13 DIAGNOSIS — H43813 Vitreous degeneration, bilateral: Secondary | ICD-10-CM | POA: Diagnosis not present

## 2023-04-13 DIAGNOSIS — H353112 Nonexudative age-related macular degeneration, right eye, intermediate dry stage: Secondary | ICD-10-CM | POA: Diagnosis not present

## 2023-04-13 DIAGNOSIS — H353124 Nonexudative age-related macular degeneration, left eye, advanced atrophic with subfoveal involvement: Secondary | ICD-10-CM | POA: Diagnosis not present

## 2023-07-13 ENCOUNTER — Ambulatory Visit: Payer: Medicare Other | Attending: Cardiology | Admitting: Cardiology

## 2023-07-13 ENCOUNTER — Encounter: Payer: Self-pay | Admitting: Cardiology

## 2023-07-13 VITALS — BP 158/60 | HR 77 | Ht 62.0 in | Wt 133.0 lb

## 2023-07-13 DIAGNOSIS — I480 Paroxysmal atrial fibrillation: Secondary | ICD-10-CM | POA: Diagnosis not present

## 2023-07-13 DIAGNOSIS — I10 Essential (primary) hypertension: Secondary | ICD-10-CM | POA: Diagnosis not present

## 2023-07-13 DIAGNOSIS — D6869 Other thrombophilia: Secondary | ICD-10-CM | POA: Diagnosis not present

## 2023-07-13 NOTE — Progress Notes (Signed)
  Electrophysiology Office Note:   Date:  07/13/2023  ID:  CANDIA KINGSBURY, DOB July 17, 1925, MRN [id number]  Primary Cardiologist: Nicki Guadalajara, MD Primary Heart Failure: None Electrophysiologist: Merranda Bolls Jorja Loa, MD      History of Present Illness:   Tabitha Sanders is a 89 y.o. female with h/o atrial fibrillation, hypertension, sick sinus syndrome seen today for routine electrophysiology followup.   Since last being seen in our clinic the patient reports doing well.  She has noted no further episodes of atrial fibrillation, only intermittent palpitations.  These are rare.  Her main issue is that it is difficult to walk due to peripheral neuropathy.  She has no other acute complaints.  she denies chest pain, palpitations, dyspnea, PND, orthopnea, nausea, vomiting, dizziness, syncope, edema, weight gain, or early satiety.   Review of systems complete and found to be negative unless listed in HPI.   EP Information / Studies Reviewed:    EKG is ordered today. Personal review as below.        Risk Assessment/Calculations:    CHA2DS2-VASc Score = 6   This indicates a 9.7% annual risk of stroke. The patient's score is based upon: CHF History: 0 HTN History: 1 Diabetes History: 0 Stroke History: 2 Vascular Disease History: 0 Age Score: 2 Gender Score: 1            Physical Exam:   VS:  There were no vitals taken for this visit.   Wt Readings from Last 3 Encounters:  01/12/23 125 lb 12.8 oz (57.1 kg)  12/16/22 127 lb 12.8 oz (58 kg)  08/20/22 135 lb 3.2 oz (61.3 kg)     GEN: Well nourished, well developed in no acute distress NECK: No JVD; No carotid bruits CARDIAC: Regular rate and rhythm, no murmurs, rubs, gallops RESPIRATORY:  Clear to auscultation without rales, wheezing or rhonchi  ABDOMEN: Soft, non-tender, non-distended EXTREMITIES:  No edema; No deformity   ASSESSMENT AND PLAN:    1.  Paroxysmal atrial fibrillation: Currently on Multaq.  No further  episodes of atrial fibrillation.  Continue with current management.  2.  Hypertension: Blood pressure is elevated today.  She thinks this is due to whitecoat hypertension.  Per further plan per primary physician.  3.  Sick sinus syndrome: Occurred post cardioversion.  No further episodes.  4.  Secondary hypercoagulable state: Currently on Eliquis for atrial fibrillation  Follow up with EP APP in 12 months  Signed, Tylar Amborn Jorja Loa, MD

## 2023-07-21 DIAGNOSIS — H35033 Hypertensive retinopathy, bilateral: Secondary | ICD-10-CM | POA: Diagnosis not present

## 2023-07-21 DIAGNOSIS — H40053 Ocular hypertension, bilateral: Secondary | ICD-10-CM | POA: Diagnosis not present

## 2023-07-21 DIAGNOSIS — Z961 Presence of intraocular lens: Secondary | ICD-10-CM | POA: Diagnosis not present

## 2023-07-21 DIAGNOSIS — H40023 Open angle with borderline findings, high risk, bilateral: Secondary | ICD-10-CM | POA: Diagnosis not present

## 2023-08-09 DIAGNOSIS — L299 Pruritus, unspecified: Secondary | ICD-10-CM | POA: Diagnosis not present

## 2023-08-09 DIAGNOSIS — L820 Inflamed seborrheic keratosis: Secondary | ICD-10-CM | POA: Diagnosis not present

## 2023-08-09 DIAGNOSIS — L821 Other seborrheic keratosis: Secondary | ICD-10-CM | POA: Diagnosis not present

## 2023-08-09 DIAGNOSIS — L309 Dermatitis, unspecified: Secondary | ICD-10-CM | POA: Diagnosis not present

## 2023-08-16 ENCOUNTER — Other Ambulatory Visit: Payer: Self-pay | Admitting: Cardiology

## 2023-08-16 DIAGNOSIS — I480 Paroxysmal atrial fibrillation: Secondary | ICD-10-CM

## 2023-08-16 DIAGNOSIS — I129 Hypertensive chronic kidney disease with stage 1 through stage 4 chronic kidney disease, or unspecified chronic kidney disease: Secondary | ICD-10-CM | POA: Diagnosis not present

## 2023-08-16 DIAGNOSIS — R809 Proteinuria, unspecified: Secondary | ICD-10-CM | POA: Diagnosis not present

## 2023-08-16 DIAGNOSIS — N1832 Chronic kidney disease, stage 3b: Secondary | ICD-10-CM | POA: Diagnosis not present

## 2023-08-16 DIAGNOSIS — D510 Vitamin B12 deficiency anemia due to intrinsic factor deficiency: Secondary | ICD-10-CM | POA: Diagnosis not present

## 2023-08-16 NOTE — Telephone Encounter (Signed)
 Prescription refill request for Eliquis received. Indication:afib Last office visit:1/25 Scr:1.51  7/24 Age: 89 Weight:60.3  kg  Prescription refilled

## 2023-09-20 DIAGNOSIS — D6869 Other thrombophilia: Secondary | ICD-10-CM | POA: Diagnosis not present

## 2023-09-20 DIAGNOSIS — D510 Vitamin B12 deficiency anemia due to intrinsic factor deficiency: Secondary | ICD-10-CM | POA: Diagnosis not present

## 2023-09-20 DIAGNOSIS — R739 Hyperglycemia, unspecified: Secondary | ICD-10-CM | POA: Diagnosis not present

## 2023-09-20 DIAGNOSIS — E782 Mixed hyperlipidemia: Secondary | ICD-10-CM | POA: Diagnosis not present

## 2023-09-20 DIAGNOSIS — G629 Polyneuropathy, unspecified: Secondary | ICD-10-CM | POA: Diagnosis not present

## 2023-09-20 DIAGNOSIS — Z79899 Other long term (current) drug therapy: Secondary | ICD-10-CM | POA: Diagnosis not present

## 2023-09-20 DIAGNOSIS — M810 Age-related osteoporosis without current pathological fracture: Secondary | ICD-10-CM | POA: Diagnosis not present

## 2023-09-20 DIAGNOSIS — I480 Paroxysmal atrial fibrillation: Secondary | ICD-10-CM | POA: Diagnosis not present

## 2023-09-20 DIAGNOSIS — Z0000 Encounter for general adult medical examination without abnormal findings: Secondary | ICD-10-CM | POA: Diagnosis not present

## 2023-09-20 DIAGNOSIS — I129 Hypertensive chronic kidney disease with stage 1 through stage 4 chronic kidney disease, or unspecified chronic kidney disease: Secondary | ICD-10-CM | POA: Diagnosis not present

## 2023-10-02 ENCOUNTER — Other Ambulatory Visit: Payer: Self-pay | Admitting: Cardiology

## 2023-10-05 ENCOUNTER — Telehealth: Payer: Self-pay | Admitting: *Deleted

## 2023-10-05 NOTE — Telephone Encounter (Signed)
Called pt and LVM asking for call back.  

## 2023-10-05 NOTE — Telephone Encounter (Signed)
 Received copper  level of 40 from [facility]. Date collected: 09/20/23. Dr Teofilo Fellers is asking for Dr Harding Li recommendation on supplementation since Copper  is still low. Pt was previously started on supplement by Dr Tresia Fruit.

## 2023-10-05 NOTE — Telephone Encounter (Signed)
 Hematology stated 4mg  twice daily. I'd call patient and ensure that is what she is taking or see what she is taking

## 2023-10-06 NOTE — Telephone Encounter (Signed)
 I would take it for 6 months and see if it helps with her symptoms, if not she can stop it thank you

## 2023-10-06 NOTE — Telephone Encounter (Signed)
 The patient returned my call. She said she only took 2 bottles of Copper  last year over the course of about 2-3 months. She is asking if she is supposed to take it daily long term. She said when she took it, the Copper  helped with her energy and she had no negative side effects. I told patient I would call her back after checking with Dr Tresia Fruit.

## 2023-10-07 NOTE — Telephone Encounter (Signed)
 I called pt and relayed that per Dr. Tresia Fruit that taking the copper  4mg  po bid (what she was recommended (she does not recall what she took when she took it last) for 6 months and see how she does, if does not help then stop it.  She verbalized understanding.  She recalls getting it from her son, online (or [facility]).  She appreciated call back.

## 2023-10-20 DIAGNOSIS — H40023 Open angle with borderline findings, high risk, bilateral: Secondary | ICD-10-CM | POA: Diagnosis not present

## 2023-10-20 DIAGNOSIS — H353114 Nonexudative age-related macular degeneration, right eye, advanced atrophic with subfoveal involvement: Secondary | ICD-10-CM | POA: Diagnosis not present

## 2023-10-20 DIAGNOSIS — H40053 Ocular hypertension, bilateral: Secondary | ICD-10-CM | POA: Diagnosis not present

## 2023-10-20 DIAGNOSIS — H353112 Nonexudative age-related macular degeneration, right eye, intermediate dry stage: Secondary | ICD-10-CM | POA: Diagnosis not present

## 2023-11-13 DIAGNOSIS — M810 Age-related osteoporosis without current pathological fracture: Secondary | ICD-10-CM | POA: Diagnosis not present

## 2023-11-13 DIAGNOSIS — I129 Hypertensive chronic kidney disease with stage 1 through stage 4 chronic kidney disease, or unspecified chronic kidney disease: Secondary | ICD-10-CM | POA: Diagnosis not present

## 2023-11-13 DIAGNOSIS — I480 Paroxysmal atrial fibrillation: Secondary | ICD-10-CM | POA: Diagnosis not present

## 2023-11-13 DIAGNOSIS — F3340 Major depressive disorder, recurrent, in remission, unspecified: Secondary | ICD-10-CM | POA: Diagnosis not present

## 2023-11-29 ENCOUNTER — Other Ambulatory Visit: Payer: Self-pay | Admitting: Cardiology

## 2023-12-01 DIAGNOSIS — L309 Dermatitis, unspecified: Secondary | ICD-10-CM | POA: Diagnosis not present

## 2023-12-01 DIAGNOSIS — L2081 Atopic neurodermatitis: Secondary | ICD-10-CM | POA: Diagnosis not present

## 2023-12-01 DIAGNOSIS — L282 Other prurigo: Secondary | ICD-10-CM | POA: Diagnosis not present

## 2023-12-13 DIAGNOSIS — I480 Paroxysmal atrial fibrillation: Secondary | ICD-10-CM | POA: Diagnosis not present

## 2023-12-13 DIAGNOSIS — M810 Age-related osteoporosis without current pathological fracture: Secondary | ICD-10-CM | POA: Diagnosis not present

## 2023-12-13 DIAGNOSIS — I129 Hypertensive chronic kidney disease with stage 1 through stage 4 chronic kidney disease, or unspecified chronic kidney disease: Secondary | ICD-10-CM | POA: Diagnosis not present

## 2023-12-13 DIAGNOSIS — F3340 Major depressive disorder, recurrent, in remission, unspecified: Secondary | ICD-10-CM | POA: Diagnosis not present

## 2024-01-13 DIAGNOSIS — F3340 Major depressive disorder, recurrent, in remission, unspecified: Secondary | ICD-10-CM | POA: Diagnosis not present

## 2024-01-13 DIAGNOSIS — I129 Hypertensive chronic kidney disease with stage 1 through stage 4 chronic kidney disease, or unspecified chronic kidney disease: Secondary | ICD-10-CM | POA: Diagnosis not present

## 2024-01-13 DIAGNOSIS — M810 Age-related osteoporosis without current pathological fracture: Secondary | ICD-10-CM | POA: Diagnosis not present

## 2024-01-13 DIAGNOSIS — I480 Paroxysmal atrial fibrillation: Secondary | ICD-10-CM | POA: Diagnosis not present

## 2024-01-18 DIAGNOSIS — L282 Other prurigo: Secondary | ICD-10-CM | POA: Diagnosis not present

## 2024-01-18 DIAGNOSIS — L309 Dermatitis, unspecified: Secondary | ICD-10-CM | POA: Diagnosis not present

## 2024-01-18 DIAGNOSIS — L820 Inflamed seborrheic keratosis: Secondary | ICD-10-CM | POA: Diagnosis not present

## 2024-02-13 DIAGNOSIS — I129 Hypertensive chronic kidney disease with stage 1 through stage 4 chronic kidney disease, or unspecified chronic kidney disease: Secondary | ICD-10-CM | POA: Diagnosis not present

## 2024-02-13 DIAGNOSIS — M810 Age-related osteoporosis without current pathological fracture: Secondary | ICD-10-CM | POA: Diagnosis not present

## 2024-02-13 DIAGNOSIS — F3340 Major depressive disorder, recurrent, in remission, unspecified: Secondary | ICD-10-CM | POA: Diagnosis not present

## 2024-02-13 DIAGNOSIS — I480 Paroxysmal atrial fibrillation: Secondary | ICD-10-CM | POA: Diagnosis not present

## 2024-02-15 DIAGNOSIS — L309 Dermatitis, unspecified: Secondary | ICD-10-CM | POA: Diagnosis not present

## 2024-02-15 DIAGNOSIS — L281 Prurigo nodularis: Secondary | ICD-10-CM | POA: Diagnosis not present

## 2024-02-15 DIAGNOSIS — L820 Inflamed seborrheic keratosis: Secondary | ICD-10-CM | POA: Diagnosis not present

## 2024-03-13 ENCOUNTER — Other Ambulatory Visit: Payer: Self-pay | Admitting: Cardiology

## 2024-03-13 DIAGNOSIS — I480 Paroxysmal atrial fibrillation: Secondary | ICD-10-CM

## 2024-03-14 DIAGNOSIS — L280 Lichen simplex chronicus: Secondary | ICD-10-CM | POA: Diagnosis not present

## 2024-03-14 DIAGNOSIS — F3340 Major depressive disorder, recurrent, in remission, unspecified: Secondary | ICD-10-CM | POA: Diagnosis not present

## 2024-03-14 DIAGNOSIS — I480 Paroxysmal atrial fibrillation: Secondary | ICD-10-CM | POA: Diagnosis not present

## 2024-03-14 DIAGNOSIS — M810 Age-related osteoporosis without current pathological fracture: Secondary | ICD-10-CM | POA: Diagnosis not present

## 2024-03-14 DIAGNOSIS — I129 Hypertensive chronic kidney disease with stage 1 through stage 4 chronic kidney disease, or unspecified chronic kidney disease: Secondary | ICD-10-CM | POA: Diagnosis not present

## 2024-03-14 DIAGNOSIS — L300 Nummular dermatitis: Secondary | ICD-10-CM | POA: Diagnosis not present

## 2024-03-14 DIAGNOSIS — L309 Dermatitis, unspecified: Secondary | ICD-10-CM | POA: Diagnosis not present

## 2024-03-22 DIAGNOSIS — I480 Paroxysmal atrial fibrillation: Secondary | ICD-10-CM | POA: Diagnosis not present

## 2024-03-22 DIAGNOSIS — D509 Iron deficiency anemia, unspecified: Secondary | ICD-10-CM | POA: Diagnosis not present

## 2024-03-22 DIAGNOSIS — R350 Frequency of micturition: Secondary | ICD-10-CM | POA: Diagnosis not present

## 2024-03-22 DIAGNOSIS — N1832 Chronic kidney disease, stage 3b: Secondary | ICD-10-CM | POA: Diagnosis not present

## 2024-04-14 DIAGNOSIS — I480 Paroxysmal atrial fibrillation: Secondary | ICD-10-CM | POA: Diagnosis not present

## 2024-04-14 DIAGNOSIS — F3340 Major depressive disorder, recurrent, in remission, unspecified: Secondary | ICD-10-CM | POA: Diagnosis not present

## 2024-04-14 DIAGNOSIS — I129 Hypertensive chronic kidney disease with stage 1 through stage 4 chronic kidney disease, or unspecified chronic kidney disease: Secondary | ICD-10-CM | POA: Diagnosis not present

## 2024-04-14 DIAGNOSIS — M810 Age-related osteoporosis without current pathological fracture: Secondary | ICD-10-CM | POA: Diagnosis not present

## 2024-05-08 DIAGNOSIS — B078 Other viral warts: Secondary | ICD-10-CM | POA: Diagnosis not present

## 2024-05-08 DIAGNOSIS — L820 Inflamed seborrheic keratosis: Secondary | ICD-10-CM | POA: Diagnosis not present

## 2024-05-08 DIAGNOSIS — D485 Neoplasm of uncertain behavior of skin: Secondary | ICD-10-CM | POA: Diagnosis not present

## 2024-05-08 DIAGNOSIS — L282 Other prurigo: Secondary | ICD-10-CM | POA: Diagnosis not present

## 2024-05-08 DIAGNOSIS — L281 Prurigo nodularis: Secondary | ICD-10-CM | POA: Diagnosis not present

## 2024-05-14 DIAGNOSIS — I129 Hypertensive chronic kidney disease with stage 1 through stage 4 chronic kidney disease, or unspecified chronic kidney disease: Secondary | ICD-10-CM | POA: Diagnosis not present

## 2024-05-14 DIAGNOSIS — F3340 Major depressive disorder, recurrent, in remission, unspecified: Secondary | ICD-10-CM | POA: Diagnosis not present

## 2024-05-14 DIAGNOSIS — M810 Age-related osteoporosis without current pathological fracture: Secondary | ICD-10-CM | POA: Diagnosis not present

## 2024-05-14 DIAGNOSIS — I480 Paroxysmal atrial fibrillation: Secondary | ICD-10-CM | POA: Diagnosis not present

## 2024-05-17 ENCOUNTER — Other Ambulatory Visit: Payer: Self-pay

## 2024-05-17 ENCOUNTER — Inpatient Hospital Stay (HOSPITAL_COMMUNITY)
Admission: EM | Admit: 2024-05-17 | Discharge: 2024-05-23 | DRG: 579 | Disposition: A | Discharge status: Skilled Nursing Facility | Attending: Internal Medicine | Admitting: Internal Medicine

## 2024-05-17 ENCOUNTER — Encounter (HOSPITAL_COMMUNITY): Payer: Self-pay | Admitting: *Deleted

## 2024-05-17 DIAGNOSIS — Z888 Allergy status to other drugs, medicaments and biological substances status: Secondary | ICD-10-CM | POA: Diagnosis not present

## 2024-05-17 DIAGNOSIS — Z23 Encounter for immunization: Secondary | ICD-10-CM | POA: Diagnosis present

## 2024-05-17 DIAGNOSIS — Z8673 Personal history of transient ischemic attack (TIA), and cerebral infarction without residual deficits: Secondary | ICD-10-CM | POA: Diagnosis not present

## 2024-05-17 DIAGNOSIS — W06XXXA Fall from bed, initial encounter: Secondary | ICD-10-CM | POA: Diagnosis present

## 2024-05-17 DIAGNOSIS — H8110 Benign paroxysmal vertigo, unspecified ear: Secondary | ICD-10-CM | POA: Diagnosis present

## 2024-05-17 DIAGNOSIS — Z961 Presence of intraocular lens: Secondary | ICD-10-CM | POA: Diagnosis present

## 2024-05-17 DIAGNOSIS — I10 Essential (primary) hypertension: Secondary | ICD-10-CM | POA: Diagnosis present

## 2024-05-17 DIAGNOSIS — E785 Hyperlipidemia, unspecified: Secondary | ICD-10-CM | POA: Diagnosis present

## 2024-05-17 DIAGNOSIS — D689 Coagulation defect, unspecified: Secondary | ICD-10-CM | POA: Diagnosis present

## 2024-05-17 DIAGNOSIS — S51801A Unspecified open wound of right forearm, initial encounter: Secondary | ICD-10-CM | POA: Diagnosis not present

## 2024-05-17 DIAGNOSIS — S51811A Laceration without foreign body of right forearm, initial encounter: Principal | ICD-10-CM

## 2024-05-17 DIAGNOSIS — N170 Acute kidney failure with tubular necrosis: Secondary | ICD-10-CM | POA: Diagnosis not present

## 2024-05-17 DIAGNOSIS — Z7901 Long term (current) use of anticoagulants: Secondary | ICD-10-CM | POA: Diagnosis not present

## 2024-05-17 DIAGNOSIS — Y92003 Bedroom of unspecified non-institutional (private) residence as the place of occurrence of the external cause: Secondary | ICD-10-CM | POA: Diagnosis not present

## 2024-05-17 DIAGNOSIS — D62 Acute posthemorrhagic anemia: Secondary | ICD-10-CM | POA: Diagnosis not present

## 2024-05-17 DIAGNOSIS — Z885 Allergy status to narcotic agent status: Secondary | ICD-10-CM | POA: Diagnosis not present

## 2024-05-17 DIAGNOSIS — M353 Polymyalgia rheumatica: Secondary | ICD-10-CM | POA: Diagnosis present

## 2024-05-17 DIAGNOSIS — Z9842 Cataract extraction status, left eye: Secondary | ICD-10-CM | POA: Diagnosis not present

## 2024-05-17 DIAGNOSIS — Z7409 Other reduced mobility: Secondary | ICD-10-CM | POA: Diagnosis present

## 2024-05-17 DIAGNOSIS — Z79899 Other long term (current) drug therapy: Secondary | ICD-10-CM | POA: Diagnosis not present

## 2024-05-17 DIAGNOSIS — I480 Paroxysmal atrial fibrillation: Secondary | ICD-10-CM | POA: Diagnosis present

## 2024-05-17 DIAGNOSIS — F32A Depression, unspecified: Secondary | ICD-10-CM | POA: Insufficient documentation

## 2024-05-17 DIAGNOSIS — S41111A Laceration without foreign body of right upper arm, initial encounter: Secondary | ICD-10-CM | POA: Diagnosis not present

## 2024-05-17 DIAGNOSIS — Z974 Presence of external hearing-aid: Secondary | ICD-10-CM | POA: Diagnosis not present

## 2024-05-17 DIAGNOSIS — D638 Anemia in other chronic diseases classified elsewhere: Secondary | ICD-10-CM | POA: Diagnosis present

## 2024-05-17 DIAGNOSIS — Z823 Family history of stroke: Secondary | ICD-10-CM | POA: Diagnosis not present

## 2024-05-17 DIAGNOSIS — F419 Anxiety disorder, unspecified: Secondary | ICD-10-CM | POA: Diagnosis present

## 2024-05-17 DIAGNOSIS — I639 Cerebral infarction, unspecified: Secondary | ICD-10-CM | POA: Diagnosis present

## 2024-05-17 MED ORDER — LIDOCAINE-EPINEPHRINE (PF) 2 %-1:200000 IJ SOLN
INTRAMUSCULAR | Status: AC
  Filled 2024-05-17: qty 20

## 2024-05-17 NOTE — ED Triage Notes (Signed)
 POV/ in wheelchair/ w/ husband/ pt states she was getting into bed and fell, hitting right arm on metal part of bed rail/ pt takes blood thinners/ lac is wrapped, bleeding controlled at this time

## 2024-05-17 NOTE — ED Triage Notes (Signed)
 The pt  has a lacerated rt forearm  pressure bandage that is already bled through although it was just applied.  She fell getting into her bed  she struck it against the bed rail.  The pt is on eliquis   no head pain only her rt arm

## 2024-05-18 ENCOUNTER — Emergency Department (HOSPITAL_COMMUNITY)

## 2024-05-18 ENCOUNTER — Inpatient Hospital Stay (HOSPITAL_COMMUNITY): Admitting: Anesthesiology

## 2024-05-18 ENCOUNTER — Encounter (HOSPITAL_COMMUNITY)
Admission: EM | Disposition: A | Discharge status: Skilled Nursing Facility | Payer: Self-pay | Source: Home / Self Care | Attending: Internal Medicine

## 2024-05-18 ENCOUNTER — Encounter (HOSPITAL_COMMUNITY): Payer: Self-pay | Admitting: Internal Medicine

## 2024-05-18 ENCOUNTER — Other Ambulatory Visit: Payer: Self-pay

## 2024-05-18 ENCOUNTER — Inpatient Hospital Stay (HOSPITAL_COMMUNITY)

## 2024-05-18 DIAGNOSIS — S51811A Laceration without foreign body of right forearm, initial encounter: Secondary | ICD-10-CM | POA: Diagnosis not present

## 2024-05-18 DIAGNOSIS — F32A Depression, unspecified: Secondary | ICD-10-CM | POA: Insufficient documentation

## 2024-05-18 DIAGNOSIS — W06XXXA Fall from bed, initial encounter: Secondary | ICD-10-CM | POA: Diagnosis not present

## 2024-05-18 DIAGNOSIS — Z23 Encounter for immunization: Secondary | ICD-10-CM | POA: Diagnosis not present

## 2024-05-18 DIAGNOSIS — S41111A Laceration without foreign body of right upper arm, initial encounter: Secondary | ICD-10-CM | POA: Diagnosis present

## 2024-05-18 HISTORY — PX: LACERATION REPAIR: SHX5284

## 2024-05-18 HISTORY — PX: INCISION AND DRAINAGE OF HEMATOMA, UPPER ARM: SHX7379

## 2024-05-18 HISTORY — PX: WOUND EXPLORATION: SHX6188

## 2024-05-18 LAB — CBC
HCT: 24.6 % — ABNORMAL LOW (ref 36.0–46.0)
HCT: 31.1 % — ABNORMAL LOW (ref 36.0–46.0)
Hemoglobin: 10.8 g/dL — ABNORMAL LOW (ref 12.0–15.0)
Hemoglobin: 8.4 g/dL — ABNORMAL LOW (ref 12.0–15.0)
MCH: 31 pg (ref 26.0–34.0)
MCH: 31.9 pg (ref 26.0–34.0)
MCHC: 34.1 g/dL (ref 30.0–36.0)
MCHC: 34.7 g/dL (ref 30.0–36.0)
MCV: 90.8 fL (ref 80.0–100.0)
MCV: 91.7 fL (ref 80.0–100.0)
Platelets: 274 K/uL (ref 150–400)
Platelets: 308 K/uL (ref 150–400)
RBC: 2.71 MIL/uL — ABNORMAL LOW (ref 3.87–5.11)
RBC: 3.39 MIL/uL — ABNORMAL LOW (ref 3.87–5.11)
RDW: 13.9 % (ref 11.5–15.5)
RDW: 14 % (ref 11.5–15.5)
WBC: 12.2 K/uL — ABNORMAL HIGH (ref 4.0–10.5)
WBC: 9.8 K/uL (ref 4.0–10.5)
nRBC: 0 % (ref 0.0–0.2)
nRBC: 0 % (ref 0.0–0.2)

## 2024-05-18 LAB — BASIC METABOLIC PANEL WITH GFR
Anion gap: 10 (ref 5–15)
BUN: 22 mg/dL (ref 8–23)
CO2: 20 mmol/L — ABNORMAL LOW (ref 22–32)
Calcium: 9.8 mg/dL (ref 8.9–10.3)
Chloride: 109 mmol/L (ref 98–111)
Creatinine, Ser: 1.57 mg/dL — ABNORMAL HIGH (ref 0.44–1.00)
GFR, Estimated: 30 mL/min — ABNORMAL LOW (ref >60)
Glucose, Bld: 137 mg/dL — ABNORMAL HIGH (ref 70–99)
Potassium: 4.2 mmol/L (ref 3.5–5.1)
Sodium: 139 mmol/L (ref 135–145)

## 2024-05-18 LAB — I-STAT CHEM 8, ED
BUN: 22 mg/dL (ref 8–23)
Calcium, Ion: 1.25 mmol/L (ref 1.15–1.40)
Chloride: 108 mmol/L (ref 98–111)
Creatinine, Ser: 1.8 mg/dL — ABNORMAL HIGH (ref 0.44–1.00)
Glucose, Bld: 134 mg/dL — ABNORMAL HIGH (ref 70–99)
HCT: 30 % — ABNORMAL LOW (ref 36.0–46.0)
Hemoglobin: 10.2 g/dL — ABNORMAL LOW (ref 12.0–15.0)
Potassium: 4.2 mmol/L (ref 3.5–5.1)
Sodium: 140 mmol/L (ref 135–145)
TCO2: 19 mmol/L — ABNORMAL LOW (ref 22–32)

## 2024-05-18 LAB — SURGICAL PCR SCREEN
MRSA, PCR: NEGATIVE
Staphylococcus aureus: NEGATIVE

## 2024-05-18 LAB — ABO/RH: ABO/RH(D): A POS

## 2024-05-18 SURGERY — WOUND EXPLORATION
Anesthesia: Monitor Anesthesia Care | Laterality: Right

## 2024-05-18 MED ORDER — 0.9 % SODIUM CHLORIDE (POUR BTL) OPTIME
TOPICAL | Status: DC | PRN
  Administered 2024-05-18: 1000 mL

## 2024-05-18 MED ORDER — PROPOFOL 500 MG/50ML IV EMUL
INTRAVENOUS | Status: DC | PRN
  Administered 2024-05-18: 60 ug/kg/min via INTRAVENOUS

## 2024-05-18 MED ORDER — CEFAZOLIN SODIUM-DEXTROSE 2-4 GM/100ML-% IV SOLN
INTRAVENOUS | Status: AC
  Filled 2024-05-18: qty 100

## 2024-05-18 MED ORDER — ORAL CARE MOUTH RINSE: 15.0000 mL | Freq: Once | OROMUCOSAL | Status: AC

## 2024-05-18 MED ORDER — POVIDONE-IODINE 10 % EX SWAB
2.0000 | Freq: Once | CUTANEOUS | Status: AC
  Administered 2024-05-18: 2 via TOPICAL

## 2024-05-18 MED ORDER — ACETAMINOPHEN 325 MG PO TABS
650.0000 mg | ORAL_TABLET | Freq: Four times a day (QID) | ORAL | Status: DC | PRN
  Administered 2024-05-19 – 2024-05-22 (×4): 650 mg via ORAL
  Filled 2024-05-18 (×4): qty 2

## 2024-05-18 MED ORDER — ACETAMINOPHEN 650 MG RE SUPP: 650.0000 mg | Freq: Four times a day (QID) | RECTAL | Status: DC | PRN

## 2024-05-18 MED ORDER — FENTANYL CITRATE (PF) 100 MCG/2ML IJ SOLN: 50.0000 ug | Freq: Once | INTRAMUSCULAR | Status: AC

## 2024-05-18 MED ORDER — ATORVASTATIN CALCIUM 40 MG PO TABS
40.0000 mg | ORAL_TABLET | Freq: Every evening | ORAL | Status: DC
  Administered 2024-05-18 – 2024-05-22 (×5): 40 mg via ORAL
  Filled 2024-05-18 (×5): qty 1

## 2024-05-18 MED ORDER — HYDRALAZINE HCL 50 MG PO TABS
50.0000 mg | ORAL_TABLET | Freq: Three times a day (TID) | ORAL | Status: DC
  Administered 2024-05-19 – 2024-05-23 (×8): 50 mg via ORAL
  Filled 2024-05-18 (×10): qty 1

## 2024-05-18 MED ORDER — MELATONIN 5 MG PO TABS
5.0000 mg | ORAL_TABLET | Freq: Every day | ORAL | Status: DC
  Administered 2024-05-18 – 2024-05-22 (×5): 5 mg via ORAL
  Filled 2024-05-18 (×5): qty 1

## 2024-05-18 MED ORDER — LACTATED RINGERS IV SOLN: INTRAVENOUS | Status: DC

## 2024-05-18 MED ORDER — FENTANYL CITRATE (PF) 100 MCG/2ML IJ SOLN
INTRAMUSCULAR | Status: AC
  Administered 2024-05-18: 50 ug via INTRAVENOUS
  Filled 2024-05-18: qty 2

## 2024-05-18 MED ORDER — CHLORHEXIDINE GLUCONATE 4 % EX SOLN: 60.0000 mL | Freq: Once | CUTANEOUS | Status: DC

## 2024-05-18 MED ORDER — SENNOSIDES-DOCUSATE SODIUM 8.6-50 MG PO TABS: 1.0000 | ORAL_TABLET | Freq: Every evening | ORAL | Status: DC | PRN

## 2024-05-18 MED ORDER — CHLORHEXIDINE GLUCONATE 0.12 % MT SOLN
OROMUCOSAL | Status: AC
  Administered 2024-05-18: 15 mL via OROMUCOSAL
  Filled 2024-05-18: qty 15

## 2024-05-18 MED ORDER — FENTANYL CITRATE (PF) 50 MCG/ML IJ SOSY: 25.0000 ug | PREFILLED_SYRINGE | INTRAMUSCULAR | Status: DC | PRN

## 2024-05-18 MED ORDER — FERROUS SULFATE 325 (65 FE) MG PO TABS
325.0000 mg | ORAL_TABLET | ORAL | Status: DC
  Administered 2024-05-19 – 2024-05-22 (×2): 325 mg via ORAL
  Filled 2024-05-18 (×2): qty 1

## 2024-05-18 MED ORDER — THROMBIN (RECOMBINANT) 5000 UNITS EX SOLR
CUTANEOUS | Status: AC
  Filled 2024-05-18: qty 5000

## 2024-05-18 MED ORDER — ACETAMINOPHEN 500 MG PO TABS
500.0000 mg | ORAL_TABLET | Freq: Four times a day (QID) | ORAL | Status: AC
  Administered 2024-05-19 (×3): 500 mg via ORAL
  Filled 2024-05-18 (×4): qty 1

## 2024-05-18 MED ORDER — CEFAZOLIN SODIUM-DEXTROSE 2-4 GM/100ML-% IV SOLN
2.0000 g | Freq: Once | INTRAVENOUS | Status: AC
  Administered 2024-05-18: 2 g via INTRAVENOUS
  Filled 2024-05-18: qty 100

## 2024-05-18 MED ORDER — ACETAMINOPHEN 10 MG/ML IV SOLN
INTRAVENOUS | Status: DC | PRN
  Administered 2024-05-18: 1000 mg via INTRAVENOUS

## 2024-05-18 MED ORDER — IOHEXOL 350 MG/ML SOLN
75.0000 mL | Freq: Once | INTRAVENOUS | Status: AC | PRN
  Administered 2024-05-18: 75 mL via INTRAVENOUS

## 2024-05-18 MED ORDER — AMLODIPINE BESYLATE 10 MG PO TABS
10.0000 mg | ORAL_TABLET | Freq: Every day | ORAL | Status: DC
  Administered 2024-05-19 – 2024-05-23 (×5): 10 mg via ORAL
  Filled 2024-05-18 (×5): qty 1

## 2024-05-18 MED ORDER — FENTANYL CITRATE (PF) 50 MCG/ML IJ SOSY
50.0000 ug | PREFILLED_SYRINGE | Freq: Once | INTRAMUSCULAR | Status: AC
  Administered 2024-05-18: 50 ug via INTRAVENOUS
  Filled 2024-05-18: qty 1

## 2024-05-18 MED ORDER — TETANUS-DIPHTH-ACELL PERTUSSIS 5-2-15.5 LF-MCG/0.5 IM SUSP
0.5000 mL | Freq: Once | INTRAMUSCULAR | Status: AC
  Administered 2024-05-18: 0.5 mL via INTRAMUSCULAR
  Filled 2024-05-18: qty 0.5

## 2024-05-18 MED ORDER — ONDANSETRON HCL 4 MG/2ML IJ SOLN
INTRAMUSCULAR | Status: DC | PRN
  Administered 2024-05-18: 4 mg via INTRAVENOUS

## 2024-05-18 MED ORDER — THROMBIN (RECOMBINANT) 20000 UNITS EX SOLR
CUTANEOUS | Status: AC
  Filled 2024-05-18: qty 20000

## 2024-05-18 MED ORDER — HYDRALAZINE HCL 20 MG/ML IJ SOLN: 5.0000 mg | Freq: Four times a day (QID) | INTRAMUSCULAR | Status: DC | PRN

## 2024-05-18 MED ORDER — BUPIVACAINE HCL (PF) 0.25 % IJ SOLN
INTRAMUSCULAR | Status: AC
  Filled 2024-05-18: qty 30

## 2024-05-18 MED ORDER — ONDANSETRON HCL 4 MG PO TABS: 4.0000 mg | ORAL_TABLET | Freq: Four times a day (QID) | ORAL | Status: DC | PRN

## 2024-05-18 MED ORDER — PROPOFOL 10 MG/ML IV BOLUS
INTRAVENOUS | Status: AC
  Filled 2024-05-18: qty 20

## 2024-05-18 MED ORDER — BUPIVACAINE-EPINEPHRINE (PF) 0.5% -1:200000 IJ SOLN
INTRAMUSCULAR | Status: DC | PRN
  Administered 2024-05-18: 30 mL via PERINEURAL

## 2024-05-18 MED ORDER — CEFAZOLIN SODIUM-DEXTROSE 2-4 GM/100ML-% IV SOLN
2.0000 g | INTRAVENOUS | Status: AC
  Administered 2024-05-18: 2 g via INTRAVENOUS

## 2024-05-18 MED ORDER — CHLORHEXIDINE GLUCONATE 0.12 % MT SOLN: 15.0000 mL | Freq: Once | OROMUCOSAL | Status: AC

## 2024-05-18 MED ORDER — MORPHINE SULFATE (PF) 4 MG/ML IV SOLN
4.0000 mg | INTRAVENOUS | Status: DC | PRN
  Administered 2024-05-18: 4 mg via INTRAVENOUS
  Filled 2024-05-18: qty 1

## 2024-05-18 MED ORDER — THROMBIN (RECOMBINANT) 20000 UNITS EX SOLR
CUTANEOUS | Status: DC | PRN
  Administered 2024-05-18: 20000 [IU] via TOPICAL

## 2024-05-18 MED ORDER — ONDANSETRON HCL 4 MG/2ML IJ SOLN
4.0000 mg | Freq: Four times a day (QID) | INTRAMUSCULAR | Status: DC | PRN
  Administered 2024-05-18: 4 mg via INTRAVENOUS
  Filled 2024-05-18: qty 2

## 2024-05-18 MED ORDER — HYDROCODONE-ACETAMINOPHEN 5-325 MG PO TABS
1.0000 | ORAL_TABLET | ORAL | Status: DC | PRN
  Administered 2024-05-19 – 2024-05-21 (×4): 2 via ORAL
  Administered 2024-05-22: 1 via ORAL
  Filled 2024-05-18 (×4): qty 2
  Filled 2024-05-18: qty 1
  Filled 2024-05-18: qty 2

## 2024-05-18 MED ORDER — SODIUM CHLORIDE 0.9 % IV BOLUS
500.0000 mL | Freq: Once | INTRAVENOUS | Status: AC
  Administered 2024-05-18: 500 mL via INTRAVENOUS

## 2024-05-18 MED ORDER — DULOXETINE HCL 30 MG PO CPEP
30.0000 mg | ORAL_CAPSULE | Freq: Every day | ORAL | Status: DC
  Administered 2024-05-18 – 2024-05-23 (×6): 30 mg via ORAL
  Filled 2024-05-18 (×6): qty 1

## 2024-05-18 MED ORDER — LIDOCAINE HCL (PF) 1 % IJ SOLN
INTRAMUSCULAR | Status: AC
  Filled 2024-05-18: qty 30

## 2024-05-18 MED ORDER — DOXYCYCLINE HYCLATE 100 MG PO TABS
100.0000 mg | ORAL_TABLET | Freq: Two times a day (BID) | ORAL | Status: DC
  Administered 2024-05-18 – 2024-05-23 (×10): 100 mg via ORAL
  Filled 2024-05-18 (×10): qty 1

## 2024-05-18 SURGICAL SUPPLY — 53 items
BAG COUNTER SPONGE SURGICOUNT (BAG) ×3 IMPLANT
BAG DECANTER FOR FLEXI CONT (MISCELLANEOUS) IMPLANT
BLADE MINI RND TIP GREEN BEAV (BLADE) IMPLANT
BLADE SURG 15 STRL LF DISP TIS (BLADE) ×6 IMPLANT
BNDG COMPR ESMARK 4X3 LF (GAUZE/BANDAGES/DRESSINGS) IMPLANT
BNDG ELASTIC 3INX 5YD STR LF (GAUZE/BANDAGES/DRESSINGS) ×3 IMPLANT
BNDG ELASTIC 4INX 5YD STR LF (GAUZE/BANDAGES/DRESSINGS) IMPLANT
BNDG GAUZE DERMACEA FLUFF 4 (GAUZE/BANDAGES/DRESSINGS) ×3 IMPLANT
CANISTER SUCTION 3000ML PPV (SUCTIONS) ×3 IMPLANT
CHLORAPREP W/TINT 26 (MISCELLANEOUS) ×3 IMPLANT
CORD BIPOLAR FORCEPS 12FT (ELECTRODE) ×3 IMPLANT
COVER MAYO STAND STRL (DRAPES) ×3 IMPLANT
COVER SURGICAL LIGHT HANDLE (MISCELLANEOUS) ×3 IMPLANT
CUFF TOURN SGL QUICK 18X4 (TOURNIQUET CUFF) IMPLANT
DRAPE SURG 17X23 STRL (DRAPES) ×3 IMPLANT
GAUZE PAD ABD 8X10 STRL (GAUZE/BANDAGES/DRESSINGS) IMPLANT
GAUZE SPONGE 4X4 12PLY STRL (GAUZE/BANDAGES/DRESSINGS) ×3 IMPLANT
GAUZE XEROFORM 1X8 LF (GAUZE/BANDAGES/DRESSINGS) ×3 IMPLANT
GAUZE XEROFORM 5X9 LF (GAUZE/BANDAGES/DRESSINGS) IMPLANT
GLOVE BIO SURGEON STRL SZ7.5 (GLOVE) ×3 IMPLANT
GLOVE BIOGEL PI IND STRL 8 (GLOVE) ×3 IMPLANT
GOWN STRL REUS W/ TWL LRG LVL3 (GOWN DISPOSABLE) ×3 IMPLANT
GOWN STRL REUS W/ TWL XL LVL3 (GOWN DISPOSABLE) ×3 IMPLANT
KIT BASIN OR (CUSTOM PROCEDURE TRAY) ×3 IMPLANT
LOOP VASCLR MAXI BLUE 18IN ST (MISCELLANEOUS) IMPLANT
NDL 18GX1X1/2 (RX/OR ONLY) (NEEDLE) IMPLANT
NDL HYPO 25X1 1.5 SAFETY (NEEDLE) IMPLANT
NEEDLE 18GX1X1/2 (RX/OR ONLY) (NEEDLE) IMPLANT
NEEDLE HYPO 25X1 1.5 SAFETY (NEEDLE) IMPLANT
PACK ORTHO EXTREMITY (CUSTOM PROCEDURE TRAY) ×3 IMPLANT
PAD CAST 3X4 CTTN HI CHSV (CAST SUPPLIES) ×3 IMPLANT
PAD CAST 4YDX4 CTTN HI CHSV (CAST SUPPLIES) IMPLANT
PADDING CAST ABS COTTON 4X4 ST (CAST SUPPLIES) ×3 IMPLANT
SOLN 0.9% NACL POUR BTL 1000ML (IV SOLUTION) ×3 IMPLANT
SPEAR EYE SURG WECK-CEL (MISCELLANEOUS) ×3 IMPLANT
SPIKE FLUID TRANSFER (MISCELLANEOUS) ×3 IMPLANT
SPLINT PLASTER CAST XFAST 3X15 (CAST SUPPLIES) IMPLANT
STOCKINETTE 4X48 STRL (DRAPES) ×3 IMPLANT
SUT ETHIBOND 3-0 V-5 (SUTURE) IMPLANT
SUT ETHILON 3 0 PS 1 (SUTURE) IMPLANT
SUT ETHILON 4 0 CL P 3 (SUTURE) IMPLANT
SUT ETHILON 4 0 PS 2 18 (SUTURE) ×3 IMPLANT
SUT NYLON 9 0 VRM6 (SUTURE) IMPLANT
SUT POLY ETHIBOND 5-0 P-3 1X18 (SUTURE) IMPLANT
SUT PROLENE 6 0 P 1 18 (SUTURE) IMPLANT
SUT SILK 4 0 PS 2 (SUTURE) IMPLANT
SUT VIC AB 4-0 PS2 18 (SUTURE) IMPLANT
SUTURE FIBERWR 4-0 18 TAPR NDL (SUTURE) IMPLANT
SYR BULB EAR ULCER 3OZ GRN STR (SYRINGE) ×3 IMPLANT
SYR CONTROL 10ML LL (SYRINGE) IMPLANT
TOWEL GREEN STERILE FF (TOWEL DISPOSABLE) ×6 IMPLANT
TUBE CONNECTING 12X1/4 (SUCTIONS) ×3 IMPLANT
UNDERPAD 30X36 HEAVY ABSORB (UNDERPADS AND DIAPERS) ×3 IMPLANT

## 2024-05-18 NOTE — Assessment & Plan Note (Addendum)
 Home Eliquis  will not be resumed on admission in setting of large laceration with blood loss Last Eliquis  dose was 12/3 AM dose. She did not take her PO dose last evening. AM team to resume home eliquis  when benefits outweigh the risk.

## 2024-05-18 NOTE — Anesthesia Procedure Notes (Signed)
 Anesthesia Regional Block: Supraclavicular block   Pre-Anesthetic Checklist: , timeout performed,  Correct Patient, Correct Site, Correct Laterality,  Correct Procedure, Correct Position, site marked,  Risks and benefits discussed,  Pre-op evaluation,  At surgeon's request and post-op pain management  Laterality: Right  Prep: Maximum Sterile Barrier Precautions used, chloraprep       Needles:  Injection technique: Single-shot  Needle Type: Echogenic Stimulator Needle     Needle Length: 5cm  Needle Gauge: 22     Additional Needles:   Procedures:,,,, ultrasound used (permanent image in chart),,    Narrative:  Start time: 05/18/2024 2:21 PM End time: 05/18/2024 2:31 PM Injection made incrementally with aspirations every 5 mL.  Performed by: Personally  Anesthesiologist: Epifanio Fallow, MD  Additional Notes:

## 2024-05-18 NOTE — Consult Note (Addendum)
 Reason for Consult:Right FA lac Referring Physician: Amy Cox Time called: [id number] Time at bedside: 45   Tabitha Sanders is an 89 y.o. female.  HPI: Tabitha Sanders was getting into bed last night and fell. She struck her right arm on the bedrail and suffered a large laceration. She was brought to the ED and hand surgery was consulted. She has continued to ooze overnight and has dropped her hgb though is currently asymptomatic from that standpoint. She is RHD and uses a cane for ambulation. Her son lives with her.  Past Medical History:  Diagnosis Date   Anemia    Anemia    iron deficiency anemia and pernicious   Aneurysm, carotid artery, internal    Benign paroxysmal positional vertigo    Brain aneurysm 05/20/2015   Cardiomegaly 11/17/2017   Cerebral infarction due to occlusion of other cerebral artery (HCC)    Depression    Essential hypertension 11/17/2017   H/O cerebral aneurysm repair 2016   Head trauma 11/17/2017   HOH (hard of hearing)    wears bilateral hearing aids   Hyperlipidemia 11/17/2017   Hypertension    Intracranial aneurysm    Paroxysmal atrial fibrillation (HCC) 12/13/2017   Polyarthritis rheumatica (HCC)    Stroke [facility])    Stroke [facility]) January 25, 2015   No deficits   Subarachnoid bleed (HCC) 11/17/2017   Syncope and collapse 11/17/2017    Past Surgical History:  Procedure Laterality Date   ABDOMINAL HYSTERECTOMY     complete   BREAST SURGERY     biopsy- bilateral- benign   COLONOSCOPY WITH PROPOFOL  N/A 08/20/2014   Procedure: COLONOSCOPY WITH PROPOFOL ;  Surgeon: Milderd Louder, MD;  Location: [facility] END[facility]COPY;  Service: Endoscopy;  Laterality: N/A;   EYE SURGERY     bilateral cataract with lens implant   IR RADIOLOGIST EVAL & MGMT  12/14/2017   RADIOLOGY WITH ANESTHESIA N/A 05/20/2015   Procedure: RADIOLOGY WITH ANESTHESIA;  Surgeon: Thyra Nash, MD;  Location: [facility] OR;  Service: Radiology;  Laterality: N/A;    Family History  Problem Relation Age of Onset   Stroke Mother     CVA Mother    Stroke Sister    CVA Sister     Social History:  reports that she has never smoked. She has never used smokeless tobacco. She reports that she does not drink alcohol  and does not use drugs.  Allergies:  Allergies  Allergen Reactions   Codeine Nausea Only   Lidocaine -Epinephrine (Pf) Other (See Comments)    Tachycardia; reports that she can tolerate plain lidocaine    Lisinopril Cough    Medications: I have reviewed the patient's current medications.  Results for orders placed or performed during the hospital encounter of 05/17/24 (from the past 48 hours)  Type and screen [facility]     Status: None   Collection Time: 05/18/24 12:05 AM  Result Value Ref Range   ABO/RH(D) A POS    Antibody Screen NEG    Sample Expiration      05/21/2024,2359 Performed at [facility], [address], [place], [place] [address]   CBC     Status: Abnormal   Collection Time: 05/18/24 12:10 AM  Result Value Ref Range   WBC 9.8 4.0 - 10.5 K/uL   RBC 3.39 (L) 3.87 - 5.11 MIL/uL   Hemoglobin 10.8 (L) 12.0 - 15.0 g/dL   HCT 68.8 (L) 63.9 - 53.9 %   MCV 91.7 80.0 - 100.0 fL  MCH 31.9 26.0 - 34.0 pg   MCHC 34.7 30.0 - 36.0 g/dL   RDW 86.0 88.4 - 84.4 %   Platelets 308 150 - 400 K/uL   nRBC 0.0 0.0 - 0.2 %    Comment: Performed at [facility], [address], [place], [place] [address]  Basic metabolic panel with GFR     Status: Abnormal   Collection Time: 05/18/24 12:10 AM  Result Value Ref Range   Sodium 139 135 - 145 mmol/L   Potassium 4.2 3.5 - 5.1 mmol/L   Chloride 109 98 - 111 mmol/L   CO2 20 (L) 22 - 32 mmol/L   Glucose, Bld 137 (H) 70 - 99 mg/dL    Comment: Glucose reference range applies only to samples taken after fasting for at least 8 hours.   BUN 22 8 - 23 mg/dL   Creatinine, Ser 8.42 (H) 0.44 - 1.00 mg/dL   Calcium  9.8 8.9 - 10.3 mg/dL   GFR, Estimated 30 (L) >60 mL/min    Comment: (NOTE) Calculated using the CKD-EPI Creatinine  Equation (2021)    Anion gap 10 5 - 15    Comment: Performed at [facility], [address] [place], [place] [address]  ABO/Rh     Status: None   Collection Time: 05/18/24 12:10 AM  Result Value Ref Range   ABO/RH(D)      A POS Performed at [facility], [address], [place], [place] [address]   I-stat chem 8, ED (not at [facility], [facility] or [facility])     Status: Abnormal   Collection Time: 05/18/24 12:21 AM  Result Value Ref Range   Sodium 140 135 - 145 mmol/L   Potassium 4.2 3.5 - 5.1 mmol/L   Chloride 108 98 - 111 mmol/L   BUN 22 8 - 23 mg/dL   Creatinine, Ser 8.19 (H) 0.44 - 1.00 mg/dL   Glucose, Bld 865 (H) 70 - 99 mg/dL    Comment: Glucose reference range applies only to samples taken after fasting for at least 8 hours.   Calcium , Ion 1.25 1.15 - 1.40 mmol/L   TCO2 19 (L) 22 - 32 mmol/L   Hemoglobin 10.2 (L) 12.0 - 15.0 g/dL   HCT 69.9 (L) 63.9 - 53.9 %  CBC     Status: Abnormal   Collection Time: 05/18/24  5:49 AM  Result Value Ref Range   WBC 12.2 (H) 4.0 - 10.5 K/uL   RBC 2.71 (L) 3.87 - 5.11 MIL/uL   Hemoglobin 8.4 (L) 12.0 - 15.0 g/dL   HCT 75.3 (L) 63.9 - 53.9 %   MCV 90.8 80.0 - 100.0 fL   MCH 31.0 26.0 - 34.0 pg   MCHC 34.1 30.0 - 36.0 g/dL   RDW 85.9 88.4 - 84.4 %   Platelets 274 150 - 400 K/uL   nRBC 0.0 0.0 - 0.2 %    Comment: Performed at [facility], [address], [place], [place] [address]    CT ANGIO UP EXTREM RIGHT W &/OR WO CONTRAST Result Date: 05/18/2024 EXAM: CTA RIGHT UPPER EXTREMITY 05/18/2024 01:06:06 AM TECHNIQUE: Contrast-enhanced computed tomography angiography of the upper extremity was performed with multiplanar reconstructions. Automated exposure control, iterative reconstruction, and/or weight based adjustment of the mA/kV was utilized to reduce the radiation dose to as low as reasonably achievable. COMPARISON: None available. CLINICAL HISTORY: Upper extremity trauma, penetrating Upper extremity trauma, penetrating FINDINGS:  ARTERIAL: The axillary, brachial, radial and ulnar  arteries are patent with no stenosis, occlusion, aneurysm or dissection. BONES AND SOFT TISSUES: Deep to the laceration within the ulnar aspect of the right forearm there is subcutaneous and intramuscular edema and hemorrhage with several foci of active bleeding ( circa series 7 image 86 ) . No acute fracture. No dislocation. IMPRESSION: 1. Subcutaneous and intramuscular edema and hemorrhage with several foci of active bleeding deep to the laceration within the ulnar aspect of the right forearm. 2. Patent axillary, brachial, radial, and ulnar arteries without acute injury. Electronically signed by: Norman Gatlin MD 05/18/2024 01:51 AM EST RP Workstation: [id number]   CT Head Wo Contrast Result Date: 05/18/2024 CLINICAL DATA:  Status post fall. EXAM: CT HEAD WITHOUT CONTRAST TECHNIQUE: Contiguous axial images were obtained from the base of the skull through the vertex without intravenous contrast. RADIATION DOSE REDUCTION: This exam was performed according to the departmental dose-optimization program which includes automated exposure control, adjustment of the mA and/or kV according to patient size and/or use of iterative reconstruction technique. COMPARISON:  June 04, 2021 FINDINGS: Brain: There is mild cerebral atrophy with widening of the extra-axial spaces and ventricular dilatation. There are areas of decreased attenuation within the white matter tracts of the supratentorial brain, consistent with microvascular disease changes. Vascular: A left cavernous carotid artery stent is noted. Skull: Normal. Negative for fracture or focal lesion. Sinuses/Orbits: No acute finding. Other: None. IMPRESSION: 1. No acute intracranial abnormality. 2. Generalized cerebral atrophy and microvascular disease changes of the supratentorial brain. 3. Left cavernous carotid artery stent. Electronically Signed   By: Suzen Dials M.D.   On: 05/18/2024 01:23    Review of  Systems  HENT:  Negative for ear discharge, ear pain, hearing loss and tinnitus.   Eyes:  Negative for photophobia and pain.  Respiratory:  Negative for cough and shortness of breath.   Cardiovascular:  Negative for chest pain.  Gastrointestinal:  Negative for abdominal pain, nausea and vomiting.  Genitourinary:  Negative for dysuria, flank pain, frequency and urgency.  Musculoskeletal:  Positive for arthralgias (Right FA). Negative for back pain, myalgias and neck pain.  Neurological:  Negative for dizziness and headaches.  Hematological:  Does not bruise/bleed easily.  Psychiatric/Behavioral:  The patient is not nervous/anxious.    Blood pressure (!) 138/54, pulse 82, temperature 98.4 F (36.9 C), temperature source Oral, resp. rate 10, height 5' 2 (1.575 m), weight 60.3 kg, SpO2 99%. Physical Exam Constitutional:      General: She is not in acute distress.    Appearance: She is well-developed. She is not diaphoretic.  HENT:     Head: Normocephalic and atraumatic.  Eyes:     General: No scleral icterus.       Right eye: No discharge.        Left eye: No discharge.     Conjunctiva/sclera: Conjunctivae normal.  Cardiovascular:     Rate and Rhythm: Normal rate and regular rhythm.  Pulmonary:     Effort: Pulmonary effort is normal. No respiratory distress.  Musculoskeletal:     Cervical back: Normal range of motion.     Comments: Right shoulder, elbow, wrist, digits- Large proximal FA laceration (dressing not taken down 2/2 concerns over hemostasis), no instability, no blocks to motion  Sens  Ax/R/M/U intact  Mot   Ax/ R/ PIN/ M/ AIN/ U intact  Rad 2+  Skin:    General: Skin is warm and dry.  Neurological:     Mental Status: She is alert.  Psychiatric:  Mood and Affect: Mood normal.        Behavior: Behavior normal.     Assessment/Plan: Right FA lac -- Plan for I&D today with Dr. Murrell. Please keep NPO.    Ozell DOROTHA Ned, PA-C Orthopedic  Surgery [phone] 05/18/2024, 10:12 AM   Addendum (05/18/2024): Patient seen and examined.  Agree with above.  States she fell late yesterday and hit her right arm on the bed frame.  She sustained a laceration to the arm.  She is seen in the emergency department where the wound was cleaned and sutured. Exam: Intact sensation capillary refill all fingertips.  She states sensation is normal in all fingertips.  Intact sensation on the dorsum of the hand.  She can flex and extend the IP joint of the thumb.  She can cross her fingers and abduct the digits.  FDP and FDS are intact to testing.  Radial pulse 2+. A/P: Right forearm laceration.  Plan surgical exploration in operating room with repair of tendon artery nerve is necessary.  Risks, benefits and alternatives of surgery were discussed including risks of blood loss, infection, damage to nerves/vessels/tendons/ligament/bone, failure of surgery, need for additional surgery, complication with wound healing, stiffness.   She voiced understanding of these risks and elected to proceed.

## 2024-05-18 NOTE — Discharge Instructions (Signed)

## 2024-05-18 NOTE — H&P (Addendum)
 History and Physical - Telemedicine  Tabitha Sanders FMW:[id number] DOB: Tabitha [birth year] DOA: 05/17/2024  PCP: Tabitha Sanders LABOR, MD  Outpatient Specialists: Dr. Inocencio, cardiology Patient coming from: home   Referring provider: Dr. Ozell Sanders and Dr. Pamella Telemedicine provider: Dr. Greig Sanders Patient location: [facility]a Sanders emergency department Referring diagnosis: Right arm laceration Patient name and DOB verified: Patient was able to verify her name as Tabitha Sanders and date of birth of Tabitha Sanders, Tabitha Sanders. Patient consented to Telemedicine Evaluation: Yes, verbal consent RN virtual assistant: Tabitha Mau, RN Video encounter time and date: 05/18/24 and approximate time of 08:56a.  I have personally briefly reviewed patient's old medical records in [facility] EMR.  Chief Concern: Right arm laceration  HPI: Tabitha Sanders is a 89 year old female history of atrial fibrillation on Eliquis , anemia of chronic disease, essential hypertension, history of cerebral aneurysm repair, hyperlipidemia, CVA and polymyalgia rheumatica, hard of hearing.  12/3 around 11p: presented to ED for evaluation for fall and hitting of the right arm on a metal part of a bed rail causing laceration.  CT head: read as no acute intracranial abnormality.  CTA right upper extremity. 1. Subcutaneous and intramuscular edema and hemorrhage with several foci of active bleeding deep to the laceration within the ulnar aspect of the right forearm. 2. Patent axillary, brachial, radial, and ulnar arteries without acute injury.  ED Treatment: cefazolin  once and Tdap.  Per cross coverage/nocturnist: EDP consulted hand surgeon Dr. Murrell. And plan is for of closure here in the ED to help with hemostasis/tamponade and plan to take to the OR in the afternoon for exploration. ED physician Dr. Poisson to facilitate tamponade.  12/4 early AM: Hospitalist consulted for further management of right upper extremity laceration.  12/4: I  assumed care of the patient. Full admission orders placed and virtual assessment performed.  -------------------------------------------- At bedside, via virtual telemedicine visit, patient was able to tell me her first and last name, her age, current calendar year and her date of birth.  She knows she is in the emergency department and is waiting for a bed.  She reports last evening, she fell from her bed and hit the bed rails causing a laceration to her right arm.  She denies head trauma, syncope, loss of consciousness. She reports initially the pain was a 10 out of 10 pain. Currently she reports the pain in the right arm is not as bad and very bearable.    She denies vision changes, double vision, dysphagia, nausea, vomiting, chest pain, shortness of breath, abdominal pain, dysuria, hematuria, diarrhea, blood in her stool, swelling of her lower extremities.  Social history: She currently lives at home in her own home.  Her son, Tabitha Sanders lives with her.  She denies tobacco, EtOH, recreational drug use.  She is retired.  ROS: Constitutional: no weight change, no fever ENT/Mouth: no sore throat, no rhinorrhea Eyes: no eye pain, no vision changes Cardiovascular: no chest pain, no dyspnea,  no edema, no palpitations Respiratory: no cough, no sputum, no wheezing Gastrointestinal: no nausea, no vomiting, no diarrhea, no constipation Genitourinary: no urinary incontinence, no dysuria, no hematuria Musculoskeletal: no arthralgias, no myalgias, + right arm laceration Skin: no skin lesions, no pruritus, + right arm skin laceration with active bleeding Neuro: + weakness, no loss of consciousness, no syncope Psych: no anxiety, no depression, no decrease appetite Heme/Lymph: no bruising, no bleeding  Assessment/Plan  Principal Problem:   Laceration of arm, right, initial encounter Active Problems:   Stroke (HCC)  Benign paroxysmal positional vertigo   Essential hypertension   Hyperlipidemia    Paroxysmal atrial fibrillation (HCC)   Depression   Assessment and Plan:  * Laceration of arm, right, initial encounter     Status post Tdap one-time dose, cefazolin  2 g IV one-time dose for surgical prophylaxis General Hand surgeon has been consulted Keep patient n.p.o. in anticipation of the OR today Symptomatic support: Morphine  4 mg IV every 4 hours as needed for moderate pain, 1 day ordered; fentanyl  25 mcg every 2 hours as needed for severe pain, 1 day ordered AM team to reevaluate patient at bedside for continued pain medication requirements Admit to telemetry, inpatient  Depression Home duloxetine  30 mg daily resumed  Paroxysmal atrial fibrillation (HCC) Home Eliquis  will not be resumed on admission in setting of large laceration with blood loss Last Eliquis  dose was 12/3 AM dose. She did not take her PO dose last evening. AM team to resume home eliquis  when benefits outweigh the risk.  Hyperlipidemia Home atorvastatin  40 mg every evening resumed  Essential hypertension Patient reports she last took her blood pressure medications yesterday, 12/20 Currently blood pressure is 95/60, with MAP greater than 65 Patient currently low normal tensive likely secondary to blood loss in setting of right arm laceration Home amlodipine  10 mg daily and hydralazine  50 mg every 8 hours resumed for blood/5 and 25 Home irbesartan 75 mg not resumed on admission A.m. team to resume when benefits outweigh the risks Hydralazine  5 mg IV every 6 hours as needed for SBP >165  Benign paroxysmal positional vertigo Fall precautions  Stroke (HCC) Home atorvastatin  40 mg daily  Chart reviewed.   DVT prophylaxis: Pharmacologic DVT not initiated on admission.  AM team to initiate pharmacologic DVT when the benefits outweigh the risk.  Code Status: Full code Diet: N.p.o. at this time except for sips with meds Family Communication: attempted to call son, Tabitha Sanders at [phone], no pick  and vm greeting did not indicated a name. Attempted to call daughter, Tabitha Sanders at [phone], no pickup and VM greeting did not indicated a name.  Disposition Plan: Pending clinical course, guarded prognosis Consults called: Hand surgeon via EDP Admission status: Telemetry, inpatient  Past Medical History:  Diagnosis Date   Anemia    Anemia    iron deficiency anemia and pernicious   Aneurysm, carotid artery, internal    Benign paroxysmal positional vertigo    Brain aneurysm 05/20/2015   Cardiomegaly 11/17/2017   Cerebral infarction due to occlusion of other cerebral artery (HCC)    Depression    Essential hypertension 11/17/2017   H/O cerebral aneurysm repair 2016   Head trauma 11/17/2017   HOH (hard of hearing)    wears bilateral hearing aids   Hyperlipidemia 11/17/2017   Hypertension    Intracranial aneurysm    Paroxysmal atrial fibrillation (HCC) 12/13/2017   Polyarthritis rheumatica (HCC)    Stroke (HCC)    Stroke [facility]) January 25, 2015   No deficits   Subarachnoid bleed (HCC) 11/17/2017   Syncope and collapse 11/17/2017   Past Surgical History:  Procedure Laterality Date   ABDOMINAL HYSTERECTOMY     complete   BREAST SURGERY     biopsy- bilateral- benign   COLONOSCOPY WITH PROPOFOL  N/A 08/20/2014   Procedure: COLONOSCOPY WITH PROPOFOL ;  Surgeon: Milderd Louder, MD;  Location: [facility] ENDOSCOPY;  Service: Endoscopy;  Laterality: N/A;   EYE SURGERY     bilateral cataract with lens implant   IR RADIOLOGIST EVAL &  MGMT  12/14/2017   RADIOLOGY WITH ANESTHESIA N/A 05/20/2015   Procedure: RADIOLOGY WITH ANESTHESIA;  Surgeon: Thyra Nash, MD;  Location: [facility] OR;  Service: Radiology;  Laterality: N/A;   Social History:  reports that she has never smoked. She has never used smokeless tobacco. She reports that she does not drink alcohol  and does not use drugs.  Allergies  Allergen Reactions   Codeine Nausea Only   Lidocaine -Epinephrine (Pf) Other (See Comments)    Tachycardia; reports  that she can tolerate plain lidocaine    Lisinopril Cough   Family History  Problem Relation Age of Onset   Stroke Mother    CVA Mother    Stroke Sister    CVA Sister    Family history: Family history reviewed and not pertinent.  Prior to Admission medications   Medication Sig Start Date End Date Taking? Authorizing Provider  acetaminophen  (TYLENOL ) 500 MG tablet Take 500 mg by mouth every 6 (six) hours as needed for mild pain.   Yes [provider]  amLODipine  (NORVASC ) 10 MG tablet Take 10 mg by mouth daily. 11/21/14  Yes [provider]  atorvastatin  (LIPITOR) 40 MG tablet Take 40 mg by mouth every evening. 10/22/17  Yes [provider]  betamethasone dipropionate 0.05 % cream Apply 1 application  topically 2 (two) times daily. 03/14/24  Yes [provider]  Cyanocobalamin  (VITAMIN B-12 PO) Take 1 tablet by mouth 2 (two) times a week. Take on Mondays and Wednesdays   Yes [provider]  dronedarone  (MULTAQ ) 400 MG tablet TAKE 1 TABLET BY MOUTH TWICE DAILY WITH A MEAL 10/05/23  Yes Camnitz, Will Gladis, MD  DULoxetine (CYMBALTA) 30 MG capsule Take 30 mg by mouth daily. 02/04/18  Yes [provider]  ELIQUIS  2.5 MG TABS tablet TAKE 1 TABLET(2.5 MG) BY MOUTH TWICE DAILY 03/13/24  Yes Camnitz, Soyla Gladis, MD  ergocalciferol (VITAMIN D2) 50000 UNITS capsule Take 50,000 Units by mouth once a week. Friday   Yes [provider]  ferrous sulfate 325 (65 FE) MG tablet Take 325 mg by mouth 3 (three) times a week. Take on Monday Wednesday Friday   Yes [provider]  hydrALAZINE  (APRESOLINE ) 50 MG tablet TAKE 1 TABLET(50 MG) BY MOUTH THREE TIMES DAILY Patient taking differently: Take 50 mg by mouth in the morning and at bedtime. 11/11/22  Yes Camnitz, Will Gladis, MD  irbesartan (AVAPRO) 75 MG tablet Take 75 mg by mouth daily. 1/Sanders/Sanders  Yes [provider]  Melatonin 5 MG TABS Take 5 mg by mouth at bedtime.   Yes [provider]  Multiple Vitamins-Minerals (PRESERVISION AREDS PO) Take 1 capsule by mouth 2 (two) times daily.   Yes [provider]  triamcinolone cream (KENALOG) 0.1 % Apply 1 Application topically 2 (two) times daily. 03/14/24  Yes [provider]  latanoprost (XALATAN) 0.005 % ophthalmic solution Place 1 drop into both eyes at bedtime. Patient not taking: Reported on 07/13/2023 03/10/22   [provider]  LUMIGAN 0.01 % SOLN Place 1 drop into both eyes at bedtime. Patient not taking: Reported on 05/18/2024 08/17/22   [provider]    Physical Exam: Vitals:   05/18/24 1000 05/18/24 1045 05/18/24 1130 05/18/24 1145  BP: (!) 138/54 (!) 141/56 (!) 129/56   Pulse: 82 84 84   Resp: 10 13 14    Temp:    98.4 F (36.9 C)  TempSrc:    Oral  SpO2: 99% 99% 99%   Weight:  Height:       Constitutional: appears age-appropriate, NAD, calm Eyes: PERRL, lids and conjunctivae normal ENMT: Mucous membranes are moist. Posterior pharynx clear of any exudate or lesions. Age-appropriate dentition. Hearing appropriate Neck: normal, supple, no masses, no thyromegaly Respiratory: clear to auscultation bilaterally, no wheezing, no crackles. Normal respiratory effort. No accessory muscle use.  Cardiovascular: Regular rate and rhythm, no murmurs / rubs / gallops. No extremity edema. 2+ pedal pulses. No carotid bruits.  Abdomen: no tenderness, no masses palpated, no hepatosplenomegaly. Bowel sounds positive.  Musculoskeletal: no clubbing / cyanosis. No joint deformity upper and lower extremities. Good ROM, no contractures, no atrophy. Normal muscle tone.  Skin:     Neurologic: Sensation intact. Strength 5/5 in all 4.  Psychiatric: Normal judgment and insight. Alert and oriented x 3. Normal mood.   EKG: Not indicated at this time  Chest x-ray on Admission: Not indicated at this time  CT ANGIO UP EXTREM RIGHT W &/OR WO CONTRAST Result Date: 05/18/2024 EXAM: CTA RIGHT  UPPER EXTREMITY 05/18/2024 01:06:06 AM TECHNIQUE: Contrast-enhanced computed tomography angiography of the upper extremity was performed with multiplanar reconstructions. Automated exposure control, iterative reconstruction, and/or weight based adjustment of the mA/kV was utilized to reduce the radiation dose to as low as reasonably achievable. COMPARISON: None available. CLINICAL HISTORY: Upper extremity trauma, penetrating Upper extremity trauma, penetrating FINDINGS: ARTERIAL: The axillary, brachial, radial and ulnar arteries are patent with no stenosis, occlusion, aneurysm or dissection. BONES AND SOFT TISSUES: Deep to the laceration within the ulnar aspect of the right forearm there is subcutaneous and intramuscular edema and hemorrhage with several foci of active bleeding ( circa series 7 image 86 ) . No acute fracture. No dislocation. IMPRESSION: 1. Subcutaneous and intramuscular edema and hemorrhage with several foci of active bleeding deep to the laceration within the ulnar aspect of the right forearm. 2. Patent axillary, brachial, radial, and ulnar arteries without acute injury. Electronically signed by: Norman Gatlin MD 05/18/2024 01:51 AM EST RP Workstation: [id number]   CT Head Wo Contrast Result Date: 05/18/2024 CLINICAL DATA:  Status post fall. EXAM: CT HEAD WITHOUT CONTRAST TECHNIQUE: Contiguous axial images were obtained from the base of the skull through the vertex without intravenous contrast. RADIATION DOSE REDUCTION: This exam was performed according to the departmental dose-optimization program which includes automated exposure control, adjustment of the mA and/or kV according to patient size and/or use of iterative reconstruction technique. COMPARISON:  December Sanders, 2022 FINDINGS: Brain: There is mild cerebral atrophy with widening of the extra-axial spaces and ventricular dilatation. There are areas of decreased attenuation within the white matter tracts of the supratentorial brain,  consistent with microvascular disease changes. Vascular: A left cavernous carotid artery stent is noted. Skull: Normal. Negative for fracture or focal lesion. Sinuses/Orbits: No acute finding. Other: None. IMPRESSION: 1. No acute intracranial abnormality. 2. Generalized cerebral atrophy and microvascular disease changes of the supratentorial brain. 3. Left cavernous carotid artery stent. Electronically Signed   By: Suzen Dials M.D.   On: 05/18/2024 01:23   Labs on Admission: I have personally reviewed following labs  CBC: Recent Labs  Lab 05/18/24 0010 05/18/24 0021 05/18/24 0549  WBC 9.8  --  12.2*  HGB 10.8* 10.2* 8.4*  HCT 31.1* 30.0* 24.6*  MCV 91.7  --  90.8  PLT 308  --  274   Basic Metabolic Panel: Recent Labs  Lab 05/18/24 0010 05/18/24 0021  NA 139 140  K 4.2 4.2  CL 109 108  CO2 20*  --  GLUCOSE 137* 134*  BUN 22 22  CREATININE 1.57* 1.80*  CALCIUM  9.8  --    GFR: Estimated Creatinine Clearance: 15.6 mL/min (A) (by C-G formula based on SCr of 1.8 mg/dL (H)).  Urine analysis:    Component Value Date/Time   COLORURINE YELLOW 11/19/2017 1518   APPEARANCEUR CLEAR 11/19/2017 1518   LABSPEC 1.016 11/19/2017 1518   PHURINE 5.0 11/19/2017 1518   GLUCOSEU NEGATIVE 11/19/2017 1518   HGBUR NEGATIVE 11/19/2017 1518   BILIRUBINUR NEGATIVE 11/19/2017 1518   KETONESUR NEGATIVE 11/19/2017 1518   PROTEINUR NEGATIVE 11/19/2017 1518   UROBILINOGEN 0.2 01/25/2015 1454   NITRITE NEGATIVE 11/19/2017 1518   LEUKOCYTESUR SMALL (A) 11/19/2017 1518   This document was prepared using Dragon Voice Recognition software and may include unintentional dictation errors.  Dr. Sherre Location: [place]  [facility]  If 7PM-7AM, please contact overnight-coverage provider If 7AM-7PM, please contact day attending provider [email or web address]  05/18/2024, 12:58 PM

## 2024-05-18 NOTE — Op Note (Signed)
 I assisted Surgeons and Role:    * Murrell Drivers, MD - Primary    * Murrell Kuba, MD - Assisting on the Procedure(s): WOUND EXPLORATION REPAIR, LACERATION, 2 OR MORE INCISION AND DRAINAGE OF HEMATOMA, UPPER ARM on 05/18/2024.  I provided assistance on this case as follows: Set up, approach, support for perforation of large wound, evacuation of large hematoma, exploration of the wound, closure of the wound and application of the dressing and splint.  Electronically signed by: Kuba Murrell, MD Date: 05/18/2024 Time: 5:06 PM

## 2024-05-18 NOTE — Progress Notes (Signed)
 Patient arrived to the room. Alert and oriented x4. VSS. No pain. Call bell within reach and bed alarm on. Family present in room.

## 2024-05-18 NOTE — Anesthesia Procedure Notes (Signed)
 Procedure Name: MAC Date/Time: 05/18/2024 4:08 PM  Performed by: Arvell Edsel HERO, CRNAPre-anesthesia Checklist: Patient identified, Emergency Drugs available, Suction available, Patient being monitored and Timeout performed Patient Re-evaluated:Patient Re-evaluated prior to induction Oxygen Delivery Method: Simple face mask Preoxygenation: Pre-oxygenation with 100% oxygen

## 2024-05-18 NOTE — Assessment & Plan Note (Addendum)
     Status post Tdap one-time dose, cefazolin  2 g IV one-time dose for surgical prophylaxis General Hand surgeon has been consulted Keep patient n.p.o. in anticipation of the OR today Symptomatic support: Morphine 4 mg IV every 4 hours as needed for moderate pain, 1 day ordered; fentanyl  25 mcg every 2 hours as needed for severe pain, 1 day ordered AM team to reevaluate patient at bedside for continued pain medication requirements Admit to telemetry, inpatient

## 2024-05-18 NOTE — Hospital Course (Addendum)
 [name] is a 89 year old female history of atrial fibrillation on Eliquis , anemia of chronic disease, essential hypertension, history of cerebral aneurysm repair, hyperlipidemia, CVA and polymyalgia rheumatica, hard of hearing.  12/3 around 11p: presented to ED for evaluation for fall and hitting of the right arm on a metal part of a bed rail causing laceration.  CT head: read as no acute intracranial abnormality.  CTA right upper extremity. 1. Subcutaneous and intramuscular edema and hemorrhage with several foci of active bleeding deep to the laceration within the ulnar aspect of the right forearm. 2. Patent axillary, brachial, radial, and ulnar arteries without acute injury.  ED Treatment: cefazolin  once and Tdap.  Per cross coverage/nocturnist: EDP consulted hand surgeon Dr. Murrell. And plan is for of closure here in the ED to help with hemostasis/tamponade and plan to take to the OR in the afternoon for exploration. ED physician Dr. Theadore to facilitate tamponade.  12/4 early AM: Hospitalist consulted for further management of right upper extremity laceration.  12/4: I assumed care of the patient. Full admission orders placed and virtual assessment performed.

## 2024-05-18 NOTE — Assessment & Plan Note (Signed)
 Home atorvastatin 40 mg every evening resumed

## 2024-05-18 NOTE — Assessment & Plan Note (Signed)
 Home duloxetine  30 mg daily resumed

## 2024-05-18 NOTE — Assessment & Plan Note (Signed)
 Patient reports she last took her blood pressure medications yesterday, 12/20 Currently blood pressure is 95/60, with MAP greater than 65 Patient currently low normal tensive likely secondary to blood loss in setting of right arm laceration Home amlodipine  10 mg daily and hydralazine  50 mg every 8 hours resumed for blood/5 and 25 Home irbesartan 75 mg not resumed on admission A.m. team to resume when benefits outweigh the risks Hydralazine  5 mg IV every 6 hours as needed for SBP >165

## 2024-05-18 NOTE — Assessment & Plan Note (Signed)
-   Home atorvastatin 40 mg daily

## 2024-05-18 NOTE — ED Provider Notes (Signed)
 [facility] Emergency Department Provider Note MRN:  [id number]  Arrival date & time: 05/18/24     Chief Complaint   Extremity Laceration   History of Present Illness   Tabitha Sanders is a 89 y.o. year-old female with history of A-fib on Eliquis  presenting to the ED with chief complaint of extremity laceration.  Clemens getting into bed with her right forearm striking the metal bed rail causing large laceration.  Denies head trauma, no neck or back pain, no other injuries.  No chest pain or shortness of breath, no abdominal pain.  Review of Systems  A thorough review of systems was obtained and all systems are negative except as noted in the HPI and PMH.   Patient's Health History    Past Medical History:  Diagnosis Date   Anemia    Anemia    iron deficiency anemia and pernicious   Aneurysm, carotid artery, internal    Benign paroxysmal positional vertigo    Brain aneurysm 05/20/2015   Cardiomegaly 11/17/2017   Cerebral infarction due to occlusion of other cerebral artery (HCC)    Depression    Essential hypertension 11/17/2017   H/O cerebral aneurysm repair 2016   Head trauma 11/17/2017   HOH (hard of hearing)    wears bilateral hearing aids   Hyperlipidemia 11/17/2017   Hypertension    Intracranial aneurysm    Paroxysmal atrial fibrillation (HCC) 12/13/2017   Polyarthritis rheumatica (HCC)    Stroke [facility])    Stroke [facility]) January 25, 2015   No deficits   Subarachnoid bleed (HCC) 11/17/2017   Syncope and collapse 11/17/2017    Past Surgical History:  Procedure Laterality Date   ABDOMINAL HYSTERECTOMY     complete   BREAST SURGERY     biopsy- bilateral- benign   COLONOSCOPY WITH PROPOFOL  N/A 08/20/2014   Procedure: COLONOSCOPY WITH PROPOFOL ;  Surgeon: Milderd Louder, MD;  Location: [facility] END[facility];  Service: Endoscopy;  Laterality: N/A;   EYE SURGERY     bilateral cataract with lens implant   IR RADIOLOGIST EVAL & MGMT  12/14/2017   RADIOLOGY WITH ANESTHESIA  N/A 05/20/2015   Procedure: RADIOLOGY WITH ANESTHESIA;  Surgeon: Thyra Nash, MD;  Location: [facility] OR;  Service: Radiology;  Laterality: N/A;    Family History  Problem Relation Age of Onset   Stroke Mother    CVA Mother    Stroke Sister    CVA Sister     Social History   Socioeconomic History   Marital status: Widowed    Spouse name: Not on file   Number of children: Not on file   Years of education: Not on file   Highest education level: Not on file  Occupational History   Not on file  Tobacco Use   Smoking status: Never   Smokeless tobacco: Never  Vaping Use   Vaping status: Never Used  Substance and Sexual Activity   Alcohol  use: No   Drug use: No   Sexual activity: Not on file  Other Topics Concern   Not on file  Social History Narrative   ** Merged History Encounter **       Social Drivers of Health   Financial Resource Strain: Not on file  Food Insecurity: Not on file  Transportation Needs: Not on file  Physical Activity: Not on file  Stress: Not on file  Social Connections: Not on file  Intimate Partner Violence: Not on file     Physical Exam   Vitals:  05/18/24 0400 05/18/24 0415  BP: (!) 136/54 (!) 133/55  Pulse: 83 75  Resp:    Temp:    SpO2: 100% 100%    CONSTITUTIONAL: Well-appearing, NAD NEURO/PSYCH:  Alert and oriented x 3, no focal deficits EYES:  eyes equal and reactive ENT/NECK:  no LAD, no JVD CARDIO: Regular rate, well-perfused, normal S1 and S2 PULM:  CTAB no wheezing or rhonchi GI/GU:  non-distended, non-tender MSK/SPINE:  No gross deformities, no edema SKIN: Large flap laceration of the medial forearm   *Additional and/or pertinent findings included in MDM below  Diagnostic and Interventional Summary    EKG Interpretation Date/Time:    Ventricular Rate:    PR Interval:    QRS Duration:    QT Interval:    QTC Calculation:   R Axis:      Text Interpretation:         Labs Reviewed  CBC - Abnormal; Notable for  the following components:      Result Value   RBC 3.39 (*)    Hemoglobin 10.8 (*)    HCT 31.1 (*)    All other components within normal limits  BASIC METABOLIC PANEL WITH GFR - Abnormal; Notable for the following components:   CO2 20 (*)    Glucose, Bld 137 (*)    Creatinine, Ser 1.57 (*)    GFR, Estimated 30 (*)    All other components within normal limits  I-STAT CHEM 8, ED - Abnormal; Notable for the following components:   Creatinine, Ser 1.80 (*)    Glucose, Bld 134 (*)    TCO2 19 (*)    Hemoglobin 10.2 (*)    HCT 30.0 (*)    All other components within normal limits  CBC    CT ANGIO UP EXTREM RIGHT W &/OR WO CONTRAST  Final Result    CT Head Wo Contrast  Final Result      Medications  lidocaine -EPINEPHrine (XYLOCAINE  W/EPI) 2 %-1:200000 (PF) injection (  Given 05/18/24 0025)  fentaNYL  (SUBLIMAZE ) injection 50 mcg (50 mcg Intravenous Given 05/18/24 0029)  ceFAZolin  (ANCEF ) IVPB 2g/100 mL premix (0 g Intravenous Stopped 05/18/24 0132)  iohexol  (OMNIPAQUE ) 350 MG/ML injection 75 mL (75 mLs Intravenous Contrast Given 05/18/24 0106)  Tdap (ADACEL) injection 0.5 mL (0.5 mLs Intramuscular Given 05/18/24 0127)     Procedures  /  Critical Care .Laceration Repair  Date/Time: 05/18/2024 4:50 AM  Performed by: Theadore Ozell HERO, MD Authorized by: Theadore Ozell HERO, MD   Consent:    Consent obtained:  Verbal   Consent given by:  Patient   Risks, benefits, and alternatives were discussed: yes     Risks discussed:  Infection, need for additional repair, nerve damage, pain, poor cosmetic result, poor wound healing, retained foreign body, tendon damage and vascular damage Universal protocol:    Procedure explained and questions answered to patient or proxy's satisfaction: yes     Immediately prior to procedure, a time out was called: yes     Patient identity confirmed:  Verbally with patient Anesthesia:    Anesthesia method:  Local infiltration   Local anesthetic:  Lidocaine  1% WITH  epi Laceration details:    Location:  Shoulder/arm   Shoulder/arm location:  R lower arm   Length (cm):  15   Depth (mm):  30 Exploration:    Limited defect created (wound extended): no     Hemostasis achieved with:  Direct pressure and tied off vessels   Imaging obtained: x-ray  Imaging outcome: foreign body not noted     Wound exploration: wound explored through full range of motion and entire depth of wound visualized     Wound extent: muscle damage     Contaminated: no   Treatment:    Area cleansed with:  Saline   Amount of cleaning:  Extensive   Layers/structures repaired:  Deep subcutaneous Deep subcutaneous:    Suture size:  4-0   Suture material:  Vicryl   Suture technique:  Simple interrupted   Number of sutures:  2 Skin repair:    Repair method:  Sutures   Suture size:  4-0   Suture material:  Prolene   Suture technique:  Simple interrupted   Number of sutures:  10 Approximation:    Approximation:  Loose Repair type:    Repair type:  Intermediate Post-procedure details:    Dressing: Pressure dressing.   Procedure completion:  Tolerated well, no immediate complications Comments:     Large flap laceration to the right forearm with muscle belly involvement.  After injected anesthesia the flap was opened revealing an area deep and anterior within the wound as the source of the continued bleeding.  2 simple interrupted stitches were applied in this area to attempt hemostasis, with these the bleeding has slowed down.  Skin then closed loosely with Prolene, long tails left given plan for definitive management in the OR later today.   ED Course and Medical Decision Making  Initial Impression and Ddx Large laceration with evidence of muscle involvement, deep to the level of the periosteum of the ulna.  Continued bleeding favoring venous, I see no pulsatile bleeding.  Suspect will need definitive repair by hand surgery, awaiting imaging, will consult.  Past  medical/surgical history that increases complexity of ED encounter: A-fib  Interpretation of Diagnostics I personally reviewed the Laboratory Testing and my interpretation is as follows: No significant blood count or electrolyte disturbance.  CTA imaging revealing some small areas of bleeding but no injury to large arteries.  Patient Reassessment and Ultimate Disposition/Management     Case discussed with Dr. Murrell of hand surgery.  Plan is for rough closure here in the emergency department to help with hemostasis/tamponade, hospitalist admission, Dr. Murrell plans to take patient to the OR for exploration in the afternoon.  Patient management required discussion with the following services or consulting groups:  Hospitalist Service  Complexity of Problems Addressed Acute illness or injury that poses threat of life of bodily function  Additional Data Reviewed and Analyzed Further history obtained from: Further history from spouse/family member  Additional Factors Impacting ED Encounter Risk Consideration of hospitalization  Ozell HERO. Theadore, MD [facility] Emergency Medicine [facility] [email or web address]  Final Clinical Impressions(s) / ED Diagnoses     ICD-10-CM   1. Laceration of right forearm, initial encounter  D48.188J       ED Discharge Orders     None        Discharge Instructions Discussed with and Provided to Patient:   Discharge Instructions   None      Theadore Ozell HERO, MD 05/18/24 [phone]

## 2024-05-18 NOTE — Progress Notes (Signed)
 Postop note: Status post exploration right forearm wound with evacuation of hematoma and closure of wound.  Okay for discharged home tomorrow from hand standpoint if medically stable.  Follow-up in office in 1 week.  Recommend 1 week oral antibiotics.

## 2024-05-18 NOTE — Op Note (Signed)
 NAME: Tabitha Sanders MEDICAL RECORD NO: [id number] DATE OF BIRTH: 28-Apr-1926 FACILITY: [name] LOCATION: [facility] OR PHYSICIAN: Hero Mccathern R. Paeton Studer, MD   OPERATIVE REPORT   DATE OF PROCEDURE: 05/18/24    PREOPERATIVE DIAGNOSIS: Right forearm laceration   POSTOPERATIVE DIAGNOSIS: Right forearm laceration with hematoma   PROCEDURE: Right forearm exploration of traumatic wound with evacuation of hematoma and repair of intermediate skin laceration, 17 cm   SURGEON:  Franky Curia, M.D.   ASSISTANT: Arley Curia, MD   ANESTHESIA:  Regional with sedation   INTRAVENOUS FLUIDS:  Per anesthesia flow sheet.   ESTIMATED BLOOD LOSS:  Minimal.   COMPLICATIONS:  None.   SPECIMENS:  none   TOURNIQUET TIME:   Right arm: Approximately 40 minutes at 250 mmHg   DISPOSITION:  Stable to PACU.   INDICATIONS: 89 year old female states she fell last evening hitting her right forearm on her bed frame.  She was seen in the emergency department where she was found to have a laceration of the proximal aspect of the forearm.  The wound was irrigated and provisionally closed and dressed.  She wishes to proceed with operative exploration of wound with repair of tendon artery nerve is necessary.  Risks, benefits and alternatives of surgery were discussed including the risks of blood loss, infection, damage to nerves, vessels, tendons, ligaments, bone for surgery, need for additional surgery, complications with wound healing, continued pain, stiffness.  She voiced understanding of these risks and elected to proceed.  OPERATIVE COURSE:  After being identified preoperatively by myself,  the patient and I agreed on the procedure and site of the procedure.  The surgical site was marked.  Surgical consent had been signed. Preoperative IV antibiotic prophylaxis was given. She was transferred to the operating room and placed on the operating table in supine position with the right upper extremity on an arm board.  Sedation was  induced by the anesthesiologist. A regional block had been performed by anesthesia in preoperative holding.    Right upper extremity was prepped and draped in normal sterile orthopedic fashion.  A surgical pause was performed between the surgeons, anesthesia, and operating room staff and all were in agreement as to the patient, procedure, and site of procedure.  Tourniquet at the proximal aspect of the extremity was inflated to 250 mmHg after exsanguination of the arm with an Esmarch bandage.  The sutures were removed from the wound.  The wound was explored.  It did not go into the fascia.  Muscle and bone were intact.  The laceration measured 17 cm.  It was U shaped creating a large proximally based flap of tissue covering the entire dorsum of the proximal forearm.  There was a pocket of wound that coursed along the medial side of the elbow.  There was hematoma within the wound which was evacuated.  There was approximately 30 to 40 cc of hematoma which was removed.  There was no gross contamination.  Bipolar electrocautery was used to obtain hemostasis.  Veins noted in the tissue flap were treated with bipolar electrocautery at thair lacerated ends to preserve venous outflow in the flap.  The wound was copiously irrigated with sterile saline.  The scissors were used to debride devitalized skin from the edge of the flap.  This was a proximally based flap.  The wound was then treated with thrombin .  It was closed with 3-0 nylon in a vertical mattress fashion.  Good reapproximation of skin edges was obtained.  The wound and skin  tears were dressed with sterile Xeroform 4 x 4's and ABD and wrapped with a Kerlix bandage.  Posterior splint was placed and wrapped with Kerlix and Ace bandage.  The tourniquet was deflated at approximately 40 minutes.  Fingertips were pink with brisk capillary refill after deflation of tourniquet.  The operative  drapes were broken down.  The patient was awoken from anesthesia safely.  She  was transferred back to the stretcher and taken to PACU in stable condition.  She is currently admitted to the hospitalist.  She can be discharged from a hand standpoint when medically stable.  I will see her back in the office in approximately 1 week.   Cayce Quezada, MD Electronically signed, 05/18/24  Addendum (05/19/24): further describe the wound and tissue flap.

## 2024-05-18 NOTE — Assessment & Plan Note (Signed)
 Fall precautions.

## 2024-05-18 NOTE — ED Provider Notes (Signed)
  Physical Exam  BP 123/69   Pulse 84   Temp 98.4 F (36.9 C) (Oral)   Resp 15   Ht 5' 2 (1.575 m)   Wt 60.3 kg   SpO2 100%   BMI 24.31 kg/m   Physical Exam  Procedures  Procedures  ED Course / MDM   Clinical Course as of 05/18/24 0934  Thu May 18, 2024  0815 Patient has remained hemodynamically stable.  We have reached out to hand PA on-call.  Awaiting evaluation in the ED.  She will be admitted to medicine service (Dr. Sherre) with plan for OR today [MP]    Clinical Course User Index [MP] Pamella Ozell LABOR, DO   Medical Decision Making I, Ozell Pamella DO, have assumed care of this patient from the previous provider pending operative repair of deep right forearm laceration.  Patient was previously admitted to hospital service  Amount and/or Complexity of Data Reviewed Labs: ordered. Radiology: ordered.  Risk Prescription drug management. Decision regarding hospitalization.          Pamella Ozell LABOR, DO 05/18/24 [phone]

## 2024-05-18 NOTE — Transfer of Care (Signed)
 Immediate Anesthesia Transfer of Care Note  Patient: Tabitha Sanders  Procedure(s) Performed: WOUND EXPLORATION (Right) REPAIR, LACERATION, 2 OR MORE (Right) INCISION AND DRAINAGE OF HEMATOMA, UPPER ARM (Left)  Patient Location: PACU  Anesthesia Type:MAC and Regional  Level of Consciousness: drowsy and patient cooperative  Airway & Oxygen Therapy: Patient Spontanous Breathing and Patient connected to face mask oxygen  Post-op Assessment: Report given to RN, Post -op Vital signs reviewed and stable, Patient moving all extremities, and Patient moving all extremities X 4  Post vital signs: Reviewed and stable  Last Vitals:  Vitals Value Taken Time  BP 135/54 05/18/24 17:15  Temp    Pulse 70 05/18/24 17:17  Resp 16 05/18/24 17:17  SpO2 94 % 05/18/24 17:17  Vitals shown include unfiled device data.  Last Pain:  Vitals:   05/18/24 1443  TempSrc:   PainSc: 5          Complications: No notable events documented.

## 2024-05-18 NOTE — Anesthesia Preprocedure Evaluation (Signed)
 Anesthesia Evaluation  Patient identified by MRN, date of birth, ID band Patient awake    Reviewed: Allergy & Precautions, H&P , NPO status , Patient's Chart, lab work & pertinent test results  Airway Mallampati: II  TM Distance: >3 FB Neck ROM: Full    Dental no notable dental hx. (+) Teeth Intact, Dental Advisory Given   Pulmonary neg pulmonary ROS   Pulmonary exam normal breath sounds clear to auscultation       Cardiovascular hypertension, Pt. on medications  Rhythm:Regular Rate:Normal     Neuro/Psych    Depression    CVA, No Residual Symptoms    GI/Hepatic negative GI ROS, Neg liver ROS,,,  Endo/Other  negative endocrine ROS    Renal/GU negative Renal ROS  negative genitourinary   Musculoskeletal  (+) Arthritis , Osteoarthritis,    Abdominal   Peds  Hematology  (+) Blood dyscrasia, anemia   Anesthesia Other Findings   Reproductive/Obstetrics negative OB ROS                              Anesthesia Physical Anesthesia Plan  ASA: 2 and emergent  Anesthesia Plan: MAC and Regional   Post-op Pain Management: Ofirmev  IV (intra-op)*   Induction: Intravenous  PONV Risk Score and Plan: 2 and Propofol  infusion and Ondansetron   Airway Management Planned: Natural Airway and Simple Face Mask  Additional Equipment:   Intra-op Plan:   Post-operative Plan:   Informed Consent: I have reviewed the patients History and Physical, chart, labs and discussed the procedure including the risks, benefits and alternatives for the proposed anesthesia with the patient or authorized representative who has indicated his/her understanding and acceptance.     Dental advisory given  Plan Discussed with: CRNA  Anesthesia Plan Comments:         Anesthesia Quick Evaluation

## 2024-05-18 NOTE — Plan of Care (Signed)

## 2024-05-19 ENCOUNTER — Encounter (HOSPITAL_COMMUNITY): Payer: Self-pay | Admitting: Orthopedic Surgery

## 2024-05-19 DIAGNOSIS — D62 Acute posthemorrhagic anemia: Secondary | ICD-10-CM | POA: Diagnosis present

## 2024-05-19 DIAGNOSIS — S41111A Laceration without foreign body of right upper arm, initial encounter: Secondary | ICD-10-CM

## 2024-05-19 LAB — CBC
HCT: 17.8 % — ABNORMAL LOW (ref 36.0–46.0)
Hemoglobin: 6.1 g/dL — CL (ref 12.0–15.0)
MCH: 31.6 pg (ref 26.0–34.0)
MCHC: 34.3 g/dL (ref 30.0–36.0)
MCV: 92.2 fL (ref 80.0–100.0)
Platelets: 187 K/uL (ref 150–400)
RBC: 1.93 MIL/uL — ABNORMAL LOW (ref 3.87–5.11)
RDW: 14.1 % (ref 11.5–15.5)
WBC: 8.6 K/uL (ref 4.0–10.5)
nRBC: 0 % (ref 0.0–0.2)

## 2024-05-19 LAB — BASIC METABOLIC PANEL WITH GFR
Anion gap: 10 (ref 5–15)
BUN: 19 mg/dL (ref 8–23)
CO2: 20 mmol/L — ABNORMAL LOW (ref 22–32)
Calcium: 8.7 mg/dL — ABNORMAL LOW (ref 8.9–10.3)
Chloride: 108 mmol/L (ref 98–111)
Creatinine, Ser: 1.11 mg/dL — ABNORMAL HIGH (ref 0.44–1.00)
GFR, Estimated: 45 mL/min — ABNORMAL LOW (ref >60)
Glucose, Bld: 97 mg/dL (ref 70–99)
Potassium: 4.3 mmol/L (ref 3.5–5.1)
Sodium: 138 mmol/L (ref 135–145)

## 2024-05-19 LAB — PREPARE RBC (CROSSMATCH)

## 2024-05-19 MED ORDER — SODIUM CHLORIDE 0.9% IV SOLUTION: Freq: Once | INTRAVENOUS | Status: AC

## 2024-05-19 NOTE — Progress Notes (Signed)
 PROGRESS NOTE    Tabitha Sanders  FMW:[id number] DOB: [birth year] DOA: 05/17/2024 PCP: Charlott Dorn LABOR, MD    Brief Narrative:  89 year old with A-fib on Eliquis , anemia of chronic disease, essential hypertension, hyperlipidemia, CVA and polymyalgia rheumatica presented to the ER with of fall, she hit her right arm on a metal part of a bed rail causing laceration and hematoma.  In the emergency room skeletal survey negative.  Head CT normal.  CT angiogram right upper extremity with subcutaneous and intramuscular edema and hemorrhage with several foci of active bleeding deep to the laceration with intact axillary brachial radial and ulnar arteries.  Resuscitated, transferred to [facility] and taken to operating room by hand surgery, debridement and closure of the wound.  Subjective: Patient seen and examined.  Mild discomfort on the right arm.  Denies any other complaints.  She is comfortable and consented with plan for blood transfusion. No family at the bedside. Patient tells me that she walks without any walker at home and is ready to learn to walk with restricted right hand.  Assessment & Plan:   Traumatic laceration of the right forearm with hematoma: Status post exploration, evacuation of hematoma and repair of skin laceration 12/4 by Dr. Murrell Posterior splint, dressing intact.  Advised not to remove until clinic follow-up in 1 week. Doxycycline  for 1 week Received Tdap Start mobilizing, elevate the arm.  PT OT.  Acute blood loss anemia: Coagulopathic on Eliquis . Recent baseline hemoglobin 10-11.  Presented with hemoglobin of 10.2.  Hemoglobin today 6.1.  No evidence of ongoing bleeding. 2 units of PRBC transfusion today.  Recheck tomorrow.  Eliquis  on hold. Patient consented for blood transfusion.  She could not sign with her right hand, however she has verbally consented and documented this.  Paroxysmal A-fib: Rate controlled.  Apparently not on any beta-blockers.   Eliquis  on hold given significant bleeding.  Will continue to hold until surgical follow-up.  Hypertension: Blood pressure is stable.  Resume amlodipine , hydralazine  and irbesartan.  Benign positional vertigo: Work with PT OT.  Depression anxiety: On Cymbalta .  Continue.  Acute kidney injury with ATN: Due to acute blood loss.  Treated with IV fluids.  Improved.  Recheck tomorrow morning.  Stop further IV fluids    DVT prophylaxis: SCD's Start: 05/18/24 1815 Place TED hose Start: 05/18/24 0815   Code Status: Full code Family Communication: None at the bedside Disposition Plan: Status is: Inpatient Remains inpatient appropriate because: Blood transfusions, PT OT     Consultants:  Hand surgery  Procedures:  Debridement and repair of the right hand injury  Antimicrobials:  Preoperative, doxycycline      Objective: Vitals:   05/18/24 2000 05/19/24 0027 05/19/24 0526 05/19/24 0831  BP: (!) 127/54 127/64 (!) 129/56 (!) 117/49  Pulse: 77 72 70 73  Resp: 16 16 16 16   Temp: 97.8 F (36.6 C) 98 F (36.7 C) 98.6 F (37 C) 97.9 F (36.6 C)  TempSrc: Oral Oral Oral   SpO2: 97% 97% 96% 98%  Weight:      Height:        Intake/Output Summary (Last 24 hours) at 05/19/2024 1101 Last data filed at 05/18/2024 1702 Gross per 24 hour  Intake 500 ml  Output --  Net 500 ml   Filed Weights   05/17/24 2317  Weight: 60.3 kg    Examination:  General exam: Appears calm and comfortable.  Pleasant interactive.  Appropriate for age. Respiratory system: Clear to auscultation. Respiratory effort normal.  No added sounds. Cardiovascular system: S1 & S2 heard, RRR.  Gastrointestinal system: Abdomen is nondistended, soft and nontender. No organomegaly or masses felt. Normal bowel sounds heard. Central nervous system: Alert and oriented. No focal neurological deficits. Extremities: Symmetric 5 x 5 power. Skin:  Right arm on splint , mild swelling of the fingers but intact neurological  and vascular status.    Data Reviewed: I have personally reviewed following labs and imaging studies  CBC: Recent Labs  Lab 05/18/24 0010 05/18/24 0021 05/18/24 0549 05/19/24 0515  WBC 9.8  --  12.2* 8.6  HGB 10.8* 10.2* 8.4* 6.1*  HCT 31.1* 30.0* 24.6* 17.8*  MCV 91.7  --  90.8 92.2  PLT 308  --  274 187   Basic Metabolic Panel: Recent Labs  Lab 05/18/24 0010 05/18/24 0021 05/19/24 0515  NA 139 140 138  K 4.2 4.2 4.3  CL 109 108 108  CO2 20*  --  20*  GLUCOSE 137* 134* 97  BUN 22 22 19   CREATININE 1.57* 1.80* 1.11*  CALCIUM  9.8  --  8.7*   GFR: Estimated Creatinine Clearance: 25.4 mL/min (A) (by C-G formula based on SCr of 1.11 mg/dL (H)). Liver Function Tests: No results for input(s): AST, ALT, ALKPHOS, BILITOT, PROT, ALBUMIN in the last 168 hours. No results for input(s): LIPASE, AMYLASE in the last 168 hours. No results for input(s): AMMONIA in the last 168 hours. Coagulation Profile: No results for input(s): INR, PROTIME in the last 168 hours. Cardiac Enzymes: No results for input(s): CKTOTAL, CKMB, CKMBINDEX, TROPONINI in the last 168 hours. BNP (last 3 results) No results for input(s): PROBNP in the last 8760 hours. HbA1C: No results for input(s): HGBA1C in the last 72 hours. CBG: No results for input(s): GLUCAP in the last 168 hours. Lipid Profile: No results for input(s): CHOL, HDL, LDLCALC, TRIG, CHOLHDL, LDLDIRECT in the last 72 hours. Thyroid  Function Tests: No results for input(s): TSH, T4TOTAL, FREET4, T3FREE, THYROIDAB in the last 72 hours. Anemia Panel: No results for input(s): VITAMINB12, FOLATE, FERRITIN, TIBC, IRON, RETICCTPCT in the last 72 hours. Sepsis Labs: No results for input(s): PROCALCITON, LATICACIDVEN in the last 168 hours.  Recent Results (from the past 240 hours)  Surgical pcr screen     Status: None   Collection Time: 05/18/24  1:58 PM   Specimen:  Nasal Mucosa; Nasal Swab  Result Value Ref Range Status   MRSA, PCR NEGATIVE NEGATIVE Final   Staphylococcus aureus NEGATIVE NEGATIVE Final    Comment: (NOTE) The Xpert SA Assay (FDA approved for NASAL specimens in patients 86 years of age and older), is one component of a comprehensive surveillance program. It is not intended to diagnose infection nor to guide or monitor treatment. Performed at [facility], [address], [place], [place] [address]          Radiology Studies: DG MINI C-ARM IMAGE ONLY Result Date: 05/18/2024 There is no interpretation for this exam.  This order is for images obtained during a surgical procedure.  Please See Surgeries Tab for more information regarding the procedure.   CT ANGIO UP EXTREM RIGHT W &/OR WO CONTRAST Result Date: 05/18/2024 EXAM: CTA RIGHT UPPER EXTREMITY 05/18/2024 01:06:06 AM TECHNIQUE: Contrast-enhanced computed tomography angiography of the upper extremity was performed with multiplanar reconstructions. Automated exposure control, iterative reconstruction, and/or weight based adjustment of the mA/kV was utilized to reduce the radiation dose to as low as reasonably achievable. COMPARISON: None available. CLINICAL HISTORY: Upper extremity trauma, penetrating  Upper extremity trauma, penetrating FINDINGS: ARTERIAL: The axillary, brachial, radial and ulnar arteries are patent with no stenosis, occlusion, aneurysm or dissection. BONES AND SOFT TISSUES: Deep to the laceration within the ulnar aspect of the right forearm there is subcutaneous and intramuscular edema and hemorrhage with several foci of active bleeding ( circa series 7 image 86 ) . No acute fracture. No dislocation. IMPRESSION: 1. Subcutaneous and intramuscular edema and hemorrhage with several foci of active bleeding deep to the laceration within the ulnar aspect of the right forearm. 2. Patent axillary, brachial, radial, and ulnar arteries without acute injury. Electronically  signed by: Norman Gatlin MD 05/18/2024 01:51 AM EST RP Workstation: [id number]   CT Head Wo Contrast Result Date: 05/18/2024 CLINICAL DATA:  Status post fall. EXAM: CT HEAD WITHOUT CONTRAST TECHNIQUE: Contiguous axial images were obtained from the base of the skull through the vertex without intravenous contrast. RADIATION DOSE REDUCTION: This exam was performed according to the departmental dose-optimization program which includes automated exposure control, adjustment of the mA and/or kV according to patient size and/or use of iterative reconstruction technique. COMPARISON:  June 04, 2021 FINDINGS: Brain: There is mild cerebral atrophy with widening of the extra-axial spaces and ventricular dilatation. There are areas of decreased attenuation within the white matter tracts of the supratentorial brain, consistent with microvascular disease changes. Vascular: A left cavernous carotid artery stent is noted. Skull: Normal. Negative for fracture or focal lesion. Sinuses/Orbits: No acute finding. Other: None. IMPRESSION: 1. No acute intracranial abnormality. 2. Generalized cerebral atrophy and microvascular disease changes of the supratentorial brain. 3. Left cavernous carotid artery stent. Electronically Signed   By: Suzen Dials M.D.   On: 05/18/2024 01:23        Scheduled Meds:  sodium chloride    Intravenous Once   acetaminophen   500 mg Oral Q6H   amLODipine   10 mg Oral Daily   atorvastatin   40 mg Oral QPM   doxycycline   100 mg Oral Q12H   DULoxetine   30 mg Oral Daily   ferrous sulfate   325 mg Oral Once per day on Monday Wednesday Friday   hydrALAZINE   50 mg Oral Q8H   melatonin  5 mg Oral QHS   Continuous Infusions:   LOS: 1 day    Time spent: 52 minutes    Renato Applebaum, MD [facility]

## 2024-05-19 NOTE — Progress Notes (Signed)
 PT Cancellation Note  Patient Details Name: Tabitha Sanders MRN: [id number] DOB: [birth year]   Cancelled Treatment:    Reason Eval/Treat Not Completed: Patient not medically ready (PT consult appreciated and chart reviewed. Pt with critical Hgb of 6.1, which is a 2.3 drop from yesterday. Per RN, pt is pending 2 units PRBCs. Will follow-up for PT evaluation after blood transfusion.)  Randall SAUNDERS, PT, DPT Acute Rehabilitation Services Office: [phone] Secure Chat Preferred  Delon CHRISTELLA Callander 05/19/2024, 10:27 AM

## 2024-05-19 NOTE — TOC Initial Note (Signed)
 Transition of Care [facility]) - Initial/Assessment Note    Patient Details  Name: Tabitha Sanders MRN: [id number] Date of Birth: 09/26/1925  Transition of Care [facility] CM/SW Contact:    Bridget Cordella Simmonds, LCSW Phone Number: 05/19/2024, 1:24 PM  Clinical Narrative:    CSW met with pt, daughter Holley, brother [name] (goes by Elsie) for initial assessment.  Permission given to speak with both of them.  Pt lives with Elsie, no current services. [name] is nurse and provides significant support.     Pt ambulatory with walker at baseline.   PT eval is still pending, they had questions about DC plans, are anticipating need for SNF.  Pt has not been to SNF before, discussed how it works, pt would be open to STR.  Discussed LTC: they have looked into some options such as ALF but as of now no plans, pt will continue with current arrangement.           ICM will continue to follow.     Expected Discharge Plan: Skilled Nursing Facility Barriers to Discharge: Continued Medical Work up, Other (must enter comment) (pending PT eval)   Patient Goals and CMS Choice Patient states their goals for this hospitalization and ongoing recovery are:: get back to where I was          Expected Discharge Plan and Services In-house Referral: Clinical Social Work     Living arrangements for the past 2 months: Single Family Home                                      Prior Living Arrangements/Services Living arrangements for the past 2 months: Single Family Home Lives with:: Siblings Patient language and need for interpreter reviewed:: Yes Do you feel safe going back to the place where you live?: Yes      Need for Family Participation in Patient Care: Yes (Comment) Care giver support system in place?: Yes (comment) Current home services: Other (comment) (none) Criminal Activity/Legal Involvement Pertinent to Current Situation/Hospitalization: No - Comment as needed  Activities of Daily Living   ADL  Screening (condition at time of admission) Independently performs ADLs?: No Does the patient have a NEW difficulty with bathing/dressing/toileting/self-feeding that is expected to last >3 days?: Yes (Initiates electronic notice to provider for possible OT consult) Does the patient have a NEW difficulty with getting in/out of bed, walking, or climbing stairs that is expected to last >3 days?: Yes (Initiates electronic notice to provider for possible PT consult) Does the patient have a NEW difficulty with communication that is expected to last >3 days?: No Is the patient deaf or have difficulty hearing?: Yes Does the patient have difficulty seeing, even when wearing glasses/contacts?: No Does the patient have difficulty concentrating, remembering, or making decisions?: No  Permission Sought/Granted Permission sought to share information with : Family Supports Permission granted to share information with : Yes, Verbal Permission Granted  Share Information with NAME: daughter  Holley, brother [name]           Emotional Assessment Appearance:: Appears stated age Attitude/Demeanor/Rapport: Engaged Affect (typically observed): Appropriate, Pleasant Orientation: : Oriented to Self, Oriented to Place, Oriented to  Time, Oriented to Situation      Admission diagnosis:  Laceration of right forearm, initial encounter [S51.811A] Laceration of arm, right, initial encounter [S41.111A] Patient Active Problem List   Diagnosis Date Noted   This 05/19/2024  Laceration of arm, right, initial encounter 05/18/2024   Depression 05/18/2024   Paroxysmal atrial fibrillation (HCC) 12/13/2017   Subarachnoid bleed (HCC) 11/17/2017   Head trauma 11/17/2017   Essential hypertension 11/17/2017   Syncope and collapse 11/17/2017   Hyperlipidemia 11/17/2017   Cardiomegaly 11/17/2017   Brain aneurysm 05/20/2015   Intracranial aneurysm    Aneurysm, carotid artery, internal    Cerebral infarction due to occlusion  of other cerebral artery (HCC)    Benign paroxysmal positional vertigo    Stroke (HCC) 01/25/2015   PCP:  Charlott Dorn LABOR, MD Pharmacy:   [id number] - [place], [place] - [address] AT [facility] [address] [place] [address] Phone: [phone] Fax: [phone]  [facility] - [place], [place] - [address] [place] [address] Phone: [phone] Fax: [phone]     Social Drivers of Health (SDOH) Social History: SDOH Screenings   Food Insecurity: No Food Insecurity (05/18/2024)  Housing: Low Risk  (05/18/2024)  Transportation Needs: No Transportation Needs (05/18/2024)  Utilities: Not At Risk (05/18/2024)  Social Connections: Moderately Isolated (05/18/2024)  Tobacco Use: Low Risk  (05/18/2024)   SDOH Interventions:     Readmission Risk Interventions     No data to display

## 2024-05-19 NOTE — Evaluation (Signed)
 Physical Therapy Evaluation Patient Details Name: Tabitha Sanders MRN: [id number] DOB: 13-Aug-1925 Today's Date: 05/19/2024  History of Present Illness  Tabitha Sanders is a 89 y.o. female admitted 05/17/24 for fall and hitting of the right arm on a metal part of a bed rail causing laceration. Pt s/p R forearm exploration and evacuation of hematoma and repair of skin laceration 12/4. PMHx: atrial fibrillation on Eliquis , anemia of chronic disease, essential hypertension, history of cerebral aneurysm repair, hyperlipidemia, CVA, and polymyalgia rheumatica.   Clinical Impression  Pt admitted with above diagnosis. PTA, pt was modI for functional mobility using a rollator. She lives with her son in a one story house with 1 STE. Pt currently with functional limitations due to the deficits listed below (see PT Problem List). She required minA for sit<>stand with 1 HHA and modA x2 to ambulate a short distance using 1 HHA. Pt is currently limited by RUE pain, weight bearing status (RUE NWB), immobility (RUE in posterior splint spanning the elbow joint), impaired balance, generalized weakness, and decreased activity tolerance. Pt will benefit from acute skilled PT to increase her independence and safety with mobility to allow discharge. Given her CLOF and high fall risk recommend continued inpatient follow up therapy, <3 hours/day.    If plan is discharge home, recommend the following: A lot of help with walking and/or transfers;A lot of help with bathing/dressing/bathroom;Assistance with cooking/housework;Assist for transportation;Help with stairs or ramp for entrance   Can travel by private vehicle   Yes    Equipment Recommendations Wheelchair (measurements PT);Wheelchair cushion (measurements PT);BSC/3in1;Other (comment) (quad cane)  Recommendations for Other Services       Functional Status Assessment Patient has had a recent decline in their functional status and demonstrates the ability to make  significant improvements in function in a reasonable and predictable amount of time.     Precautions / Restrictions Precautions Precautions: Fall Recall of Precautions/Restrictions: Impaired Required Braces or Orthoses: Splint/Cast Splint/Cast: R UE posterior splint wrapped with Kerlix and Ace bandage Splint/Cast - Date Prophylactic Dressing Applied (if applicable): 05/18/24 Restrictions Weight Bearing Restrictions Per Provider Order: Yes RUE Weight Bearing Per Provider Order: Non weight bearing      Mobility  Bed Mobility               General bed mobility comments: Not assessed. Pt greeted seated in the recliner chair and returned there at end of session.    Transfers Overall transfer level: Needs assistance Equipment used: 1 person hand held assist Transfers: Sit to/from Stand Sit to Stand: Min assist           General transfer comment: Pt stood from recliner chair. She pushed up with LUE on armrest. Supported RUE against pt's chest to prevent WBing. Powered up with modA. Pt unsteady increased time to achieve upright posture. Fair eccentric control. Cued pt to reach back for armrest with L hand, but difficulty sequencing this. Pt opts to just sit down when BLE feel the surface behind them.    Ambulation/Gait Ambulation/Gait assistance: Mod assist, +2 safety/equipment (Chair Follow) Gait Distance (Feet): 8 Feet (x2, seated rest break) Assistive device: 1 person hand held assist Gait Pattern/deviations: Step-to pattern, Decreased step length - right, Decreased step length - left, Decreased stride length, Decreased dorsiflexion - right, Decreased dorsiflexion - left, Narrow base of support, Leaning posteriorly Gait velocity: decreased     General Gait Details: Pt took short slow steps with 1 HHA supporting LUE. Pt was unsteady with posterior lean. PT  guided pt's direction and provided stability given postural sway. Pt with minimal foot clearaence. Frequent cues to push  down through LUE as pt often was pulling hand up towards head. Close chair follow for safety.  Stairs            Wheelchair Mobility     Tilt Bed    Modified Rankin (Stroke Patients Only)       Balance Overall balance assessment: Needs assistance, History of Falls Sitting-balance support: Bilateral upper extremity supported, Feet supported Sitting balance-Leahy Scale: Poor Sitting balance - Comments: Sitting down pt would frequently recline with BLE coming of the ground. She required VC/TC to maintain feet flat on floor and upright posture. Postural control: Posterior lean Standing balance support: During functional activity, Single extremity supported Standing balance-Leahy Scale: Poor Standing balance comment: Pt dependent on RW for support/stability requiring min-modA.                             Pertinent Vitals/Pain Pain Assessment Pain Assessment: Faces Faces Pain Scale: Hurts little more Pain Location: R UE Pain Descriptors / Indicators: Discomfort, Aching, Spasm Pain Intervention(s): Monitored during session, Limited activity within patient's tolerance, Repositioned    Home Living Family/patient expects to be discharged to:: Private residence Living Arrangements: Children (Son (68 y/o)) Available Help at Discharge: Family;Available PRN/intermittently Type of Home: House Home Access: Stairs to enter Entrance Stairs-Rails: None Entrance Stairs-Number of Steps: 1 in front, 8 in back   Home Layout: One level Home Equipment: Rollator (4 wheels);Cane - single point;Shower seat      Prior Function Prior Level of Function : Independent/Modified Independent;History of Falls (last six months)             Mobility Comments: Ambulates using rollater, reports 2 falls in last 6 months. ADLs Comments: Ind with ADLs, light meal prep     Extremity/Trunk Assessment   Upper Extremity Assessment Upper Extremity Assessment: Defer to OT evaluation     Lower Extremity Assessment Lower Extremity Assessment: Generalized weakness;RLE deficits/detail;LLE deficits/detail RLE Sensation: history of peripheral neuropathy;decreased proprioception RLE Coordination: decreased gross motor LLE Sensation: history of peripheral neuropathy;decreased proprioception LLE Coordination: decreased gross motor    Cervical / Trunk Assessment Cervical / Trunk Assessment: Kyphotic  Communication   Communication Communication: Impaired Factors Affecting Communication: Hearing impaired    Cognition Arousal: Alert Behavior During Therapy: WFL for tasks assessed/performed   PT - Cognitive impairments: Orientation, Awareness, Problem solving, Safety/Judgement, Sequencing   Orientation impairments: Situation                   PT - Cognition Comments: Pt was slightly confused. She required max cues for appropriate sequencing and multi-modal cues for improve technique. Pt demonstrated poor safety awareness. She additionally frequently attempted to utilize RUE depsite it being NWB. Ordered a sling to aid in immobility and supporting RUE. Following commands: Impaired Following commands impaired: Follows one step commands with increased time     Cueing Cueing Techniques: Verbal cues, Gestural cues, Tactile cues     General Comments General comments (skin integrity, edema, etc.): Pt with LUE edema especially into the hand. Repositioned pillows to prop pt in a move elevated position. Educated pt/family on the milking technique to help get fluid out of the pt's fingers. Encouraged pt to complete fist squeezes and finger curls to aid in getting the swelling out of her hand. Recommend use of ice packs.    Exercises  Assessment/Plan    PT Assessment Patient needs continued PT services  PT Problem List Decreased strength;Decreased range of motion;Decreased activity tolerance;Decreased balance;Decreased mobility;Decreased knowledge of use of DME;Decreased  safety awareness;Decreased knowledge of precautions;Pain       PT Treatment Interventions DME instruction;Gait training;Functional mobility training;Therapeutic activities;Therapeutic exercise;Balance training;Patient/family education    PT Goals (Current goals can be found in the Care Plan section)  Acute Rehab PT Goals Patient Stated Goal: Return Home PT Goal Formulation: With patient/family Time For Goal Achievement: 06/02/24 Potential to Achieve Goals: Fair    Frequency Min 2X/week     Co-evaluation               AM-PAC PT 6 Clicks Mobility  Outcome Measure Help needed turning from your back to your side while in a flat bed without using bedrails?: A Little Help needed moving from lying on your back to sitting on the side of a flat bed without using bedrails?: A Little Help needed moving to and from a bed to a chair (including a wheelchair)?: A Lot Help needed standing up from a chair using your arms (e.g., wheelchair or bedside chair)?: A Little Help needed to walk in hospital room?: A Lot Help needed climbing 3-5 steps with a railing? : A Lot 6 Click Score: 15    End of Session Equipment Utilized During Treatment: Gait belt Activity Tolerance: Patient tolerated treatment well;Patient limited by fatigue Patient left: in chair;with call bell/phone within reach;with chair alarm set;with family/visitor present Nurse Communication: Mobility status PT Visit Diagnosis: Difficulty in walking, not elsewhere classified (R26.2);Muscle weakness (generalized) (M62.81);Other abnormalities of gait and mobility (R26.89);Unsteadiness on feet (R26.81);History of falling (Z91.81)    Time: 8571-8549 PT Time Calculation (min) (ACUTE ONLY): 22 min   Charges:   PT Evaluation $PT Eval Moderate Complexity: 1 Mod   PT General Charges $$ ACUTE PT VISIT: 1 Visit         Randall SAUNDERS, PT, DPT Acute Rehabilitation Services Office: [phone] Secure Chat Preferred  Tabitha Sanders 05/19/2024, 3:44 PM

## 2024-05-19 NOTE — Progress Notes (Signed)
 Orthopedic Tech Progress Note Patient Details:  Tabitha Sanders 30-Dec-1925 [id number]  Ortho Devices Type of Ortho Device: Shoulder immobilizer Ortho Device/Splint Location: RUE Ortho Device/Splint Interventions: Ordered, Application, Adjustment   Post Interventions Patient Tolerated: Well Instructions Provided: Adjustment of device, Care of device  Adine Tabitha Sanders 05/19/2024, 4:14 PM

## 2024-05-19 NOTE — Progress Notes (Signed)
   05/19/24 0700  Provider Notification  Provider Name/Title Franky MD  Date Provider Notified 05/19/24  Time Provider Notified [phone]  Method of Notification Page  Notification Reason Critical Result  Provider response Evaluate remotely  Date of Provider Response 05/19/24  Time of Provider Response [phone]   Patient with a critical Lab 6.1, No bleeding noted. VS stable, and remains asymptomatic. MD notified. Patient unable to sign consent d/t Rt dominant arm injury. Attempt to reach out to Family members for verbal consent but was unsuccessful. 2 RN received verbal consent from patient and consent placed in chart. MD notified.

## 2024-05-19 NOTE — Evaluation (Signed)
 Occupational Therapy Evaluation Patient Details Name: Tabitha Sanders MRN: [id number] DOB: 02/29/1926 Today's Date: 05/19/2024   History of Present Illness   [name] is a 89 year old female history of atrial fibrillation on Eliquis , anemia of chronic disease, essential hypertension, history of cerebral aneurysm repair, hyperlipidemia, CVA and polymyalgia rheumatica, hard of hearing. Pt presented to ED for evaluation for fall and hitting of the right arm on a metal part of a bed rail causing laceration     Clinical Impressions RN ok with OT to work with pt. Pt demos decline in function and safety with ADLs and ADL mobility with impaired strength, balance, endurance and R UE ROM/function with R UE posterior splint wrapped with Kerlix and Ace bandage. Pt is R handed. PTA pt reports that she lives with her 61 y/o son and that she was Ind with ADLs and light meal prep and used a rollater for mobility, reports 2 falls in last months. Pt currently requires min A to sit EOB, mod STS and to SPT to [facility] then to chair. Pt requires set up with feeding herself, min A with UB ADLs  max-total A with LB ADLs and total A with toileting tasks. OT will follow acutely to maximize level of function and safety     If plan is discharge home, recommend the following:   A lot of help with bathing/dressing/bathroom;A lot of help with walking and/or transfers;Direct supervision/assist for financial management;Direct supervision/assist for medications management;Help with stairs or ramp for entrance     Functional Status Assessment   Patient has had a recent decline in their functional status and demonstrates the ability to make significant improvements in function in a reasonable and predictable amount of time.     Equipment Recommendations   Other (comment) (defer)     Recommendations for Other Services         Precautions/Restrictions   Precautions Precautions: Fall;Other (comment) (R  UE) Precaution/Restrictions Comments: L hand edema Required Braces or Orthoses: Splint/Cast Splint/Cast: R UE posterior splint wrapped with Kerlix and Ace bandage Splint/Cast - Date Prophylactic Dressing Applied (if applicable): 05/18/24 Restrictions Weight Bearing Restrictions Per Provider Order: Yes RUE Weight Bearing Per Provider Order: Non weight bearing     Mobility Bed Mobility Overal bed mobility: Needs Assistance Bed Mobility: Supine to Sit     Supine to sit: Min assist, HOB elevated, Used rails     General bed mobility comments: increased time and effort, min A to elevate trunk, pt able to scoot hips to EOB    Transfers Overall transfer level: Needs assistance Equipment used: 1 person hand held assist Transfers: Sit to/from Stand, Bed to chair/wheelchair/BSC Sit to Stand: Mod assist Stand pivot transfers: Mod assist                Balance Overall balance assessment: Needs assistance Sitting-balance support: Feet supported, No upper extremity supported Sitting balance-Leahy Scale: Fair     Standing balance support: Single extremity supported, During functional activity Standing balance-Leahy Scale: Poor                             ADL either performed or assessed with clinical judgement   ADL Overall ADL's : Needs assistance/impaired Eating/Feeding: Set up;Sitting   Grooming: Wash/dry hands;Wash/dry face;Oral care;Contact guard assist;Sitting   Upper Body Bathing: Minimal assistance;Sitting   Lower Body Bathing: Maximal assistance   Upper Body Dressing : Minimal assistance;Sitting   Lower Body Dressing: Total  assistance   Toilet Transfer: Moderate assistance;Stand-pivot;BSC/3in1;Cueing for safety   Toileting- Clothing Manipulation and Hygiene: Total assistance       Functional mobility during ADLs: Moderate assistance;Cueing for safety       Vision Baseline Vision/History: 1 Wears glasses Ability to See in Adequate Light: 0  Adequate Patient Visual Report: No change from baseline       Perception         Praxis         Pertinent Vitals/Pain Pain Assessment Pain Assessment: Faces Faces Pain Scale: Hurts little more Pain Location: R UE Pain Descriptors / Indicators: Sore, Discomfort, Constant Pain Intervention(s): Limited activity within patient's tolerance, Monitored during session, Premedicated before session, Repositioned     Extremity/Trunk Assessment Upper Extremity Assessment Upper Extremity Assessment: Right hand dominant;RUE deficits/detail RUE Deficits / Details: R UE posterior splint wrapped with Kerlix and Ace bandage RUE: Unable to fully assess due to immobilization RUE Sensation: decreased light touch RUE Coordination: decreased fine motor;decreased gross motor   Lower Extremity Assessment Lower Extremity Assessment: Defer to PT evaluation       Communication Communication Communication: No apparent difficulties Factors Affecting Communication: Hearing impaired   Cognition Arousal: Alert Behavior During Therapy: WFL for tasks assessed/performed                                 Following commands: Intact       Cueing  General Comments   Cueing Techniques: Verbal cues      Exercises     Shoulder Instructions      Home Living Family/patient expects to be discharged to:: Private residence Living Arrangements: Children Available Help at Discharge: Family;Available PRN/intermittently Type of Home: House Home Access: Stairs to enter [employer] of Steps: 1 in front, 8 in back   Home Layout: One level     Bathroom Shower/Tub: Producer, Television/film/video: Standard     Home Equipment: Rollator (4 wheels);Cane - single point;Shower seat   Additional Comments: pt reports that her son in 66 t/o and not well and that she isn't sure that he would be able to assist her at home      Prior Functioning/Environment Prior Level of Function  : Independent/Modified Independent;History of Falls (last six months)             Mobility Comments: uses rollater, reports 2 falls in last 6 months ADLs Comments: Ind with ADLs, light meal prep    OT Problem List: Decreased strength;Decreased knowledge of use of DME or AE;Increased edema;Decreased range of motion;Decreased coordination;Decreased activity tolerance;Impaired UE functional use;Pain;Impaired balance (sitting and/or standing)   OT Treatment/Interventions: Self-care/ADL training;Therapeutic exercise;Patient/family education;Balance training;Therapeutic activities;DME and/or AE instruction      OT Goals(Current goals can be found in the care plan section)   Acute Rehab OT Goals Patient Stated Goal: go home OT Goal Formulation: With patient Time For Goal Achievement: 06/02/24 Potential to Achieve Goals: Good ADL Goals Pt Will Perform Grooming: with supervision;with set-up;sitting Pt Will Perform Upper Body Bathing: with contact guard assist;with supervision;sitting Pt Will Perform Lower Body Bathing: with mod assist;sitting/lateral leans Pt Will Perform Upper Body Dressing: with contact guard assist;with supervision;sitting Pt Will Transfer to Toilet: with min assist;with contact guard assist;ambulating;stand pivot transfer Pt Will Perform Toileting - Clothing Manipulation and hygiene: with max assist;with mod assist;sitting/lateral leans;sit to/from stand   OT Frequency:  Min 2X/week    Co-evaluation  AM-PAC OT 6 Clicks Daily Activity     Outcome Measure Help from another person eating meals?: A Little Help from another person taking care of personal grooming?: A Little Help from another person toileting, which includes using toliet, bedpan, or urinal?: Total Help from another person bathing (including washing, rinsing, drying)?: A Lot Help from another person to put on and taking off regular upper body clothing?: A Little Help from another  person to put on and taking off regular lower body clothing?: Total 6 Click Score: 13   End of Session Equipment Utilized During Treatment: Gait belt;Other (comment) [facility]) Nurse Communication: Mobility status  Activity Tolerance: Patient tolerated treatment well Patient left: in chair;with call bell/phone within reach;with chair alarm set  OT Visit Diagnosis: Unsteadiness on feet (R26.81);Other abnormalities of gait and mobility (R26.89);Muscle weakness (generalized) (M62.81);History of falling (Z91.81);Pain Pain - Right/Left: Right Pain - part of body: Arm;Hand                Time: 8986-8953 OT Time Calculation (min): 33 min Charges:  OT General Charges $OT Visit: 1 Visit OT Evaluation $OT Eval Moderate Complexity: 1 Mod OT Treatments $Therapeutic Activity: 8-22 mins    Jacques Karna Loose 05/19/2024, 11:11 AM

## 2024-05-20 DIAGNOSIS — S41111A Laceration without foreign body of right upper arm, initial encounter: Secondary | ICD-10-CM | POA: Diagnosis not present

## 2024-05-20 LAB — COMPREHENSIVE METABOLIC PANEL WITH GFR
ALT: 6 U/L (ref 0–44)
AST: 24 U/L (ref 15–41)
Albumin: 2.7 g/dL — ABNORMAL LOW (ref 3.5–5.0)
Alkaline Phosphatase: 56 U/L (ref 38–126)
Anion gap: 6 (ref 5–15)
BUN: 19 mg/dL (ref 8–23)
CO2: 22 mmol/L (ref 22–32)
Calcium: 8.9 mg/dL (ref 8.9–10.3)
Chloride: 106 mmol/L (ref 98–111)
Creatinine, Ser: 1.3 mg/dL — ABNORMAL HIGH (ref 0.44–1.00)
GFR, Estimated: 38 mL/min — ABNORMAL LOW (ref >60)
Glucose, Bld: 97 mg/dL (ref 70–99)
Potassium: 4.1 mmol/L (ref 3.5–5.1)
Sodium: 134 mmol/L — ABNORMAL LOW (ref 135–145)
Total Bilirubin: 0.7 mg/dL (ref 0.0–1.2)
Total Protein: 4.8 g/dL — ABNORMAL LOW (ref 6.5–8.1)

## 2024-05-20 LAB — BPAM RBC
Blood Product Expiration Date: 202512092359
Blood Product Expiration Date: 202512112359
ISSUE DATE / TIME: 202512051049
ISSUE DATE / TIME: 202512051736
Unit Type and Rh: 6200
Unit Type and Rh: 6200

## 2024-05-20 LAB — TYPE AND SCREEN
ABO/RH(D): A POS
Antibody Screen: NEGATIVE
Unit division: 0
Unit division: 0

## 2024-05-20 LAB — CBC WITH DIFFERENTIAL/PLATELET
Abs Immature Granulocytes: 0.04 K/uL (ref 0.00–0.07)
Basophils Absolute: 0 K/uL (ref 0.0–0.1)
Basophils Relative: 0 %
Eosinophils Absolute: 0.1 K/uL (ref 0.0–0.5)
Eosinophils Relative: 2 %
HCT: 25.7 % — ABNORMAL LOW (ref 36.0–46.0)
Hemoglobin: 8.9 g/dL — ABNORMAL LOW (ref 12.0–15.0)
Immature Granulocytes: 1 %
Lymphocytes Relative: 10 %
Lymphs Abs: 0.9 K/uL (ref 0.7–4.0)
MCH: 30.1 pg (ref 26.0–34.0)
MCHC: 34.6 g/dL (ref 30.0–36.0)
MCV: 86.8 fL (ref 80.0–100.0)
Monocytes Absolute: 0.9 K/uL (ref 0.1–1.0)
Monocytes Relative: 10 %
Neutro Abs: 6.7 K/uL (ref 1.7–7.7)
Neutrophils Relative %: 77 %
Platelets: 178 K/uL (ref 150–400)
RBC: 2.96 MIL/uL — ABNORMAL LOW (ref 3.87–5.11)
RDW: 17 % — ABNORMAL HIGH (ref 11.5–15.5)
WBC: 8.6 K/uL (ref 4.0–10.5)
nRBC: 0 % (ref 0.0–0.2)

## 2024-05-20 LAB — HEMOGLOBIN AND HEMATOCRIT, BLOOD
HCT: 24 % — ABNORMAL LOW (ref 36.0–46.0)
Hemoglobin: 8.3 g/dL — ABNORMAL LOW (ref 12.0–15.0)

## 2024-05-20 LAB — MAGNESIUM: Magnesium: 1.6 mg/dL — ABNORMAL LOW (ref 1.7–2.4)

## 2024-05-20 MED ORDER — HYDROCODONE-ACETAMINOPHEN 5-325 MG PO TABS: 1.0000 | ORAL_TABLET | ORAL | 0 refills | Status: AC | PRN

## 2024-05-20 MED ORDER — MAGNESIUM OXIDE -MG SUPPLEMENT 400 (240 MG) MG PO TABS
400.0000 mg | ORAL_TABLET | Freq: Every day | ORAL | Status: DC
  Administered 2024-05-20 – 2024-05-23 (×4): 400 mg via ORAL
  Filled 2024-05-20 (×4): qty 1

## 2024-05-20 NOTE — Progress Notes (Signed)
 PROGRESS NOTE    Tabitha Sanders  FMW:[id number] DOB: Dec 06, 1925 DOA: 05/17/2024 PCP: Charlott Dorn LABOR, MD    Brief Narrative:  89 year old with A-fib on Eliquis , anemia of chronic disease, essential hypertension, hyperlipidemia, CVA and polymyalgia rheumatica presented to the ER with of fall, she hit her right arm on a metal part of a bed rail causing laceration and hematoma.  In the emergency room skeletal survey negative.  Head CT normal.  CT angiogram right upper extremity with subcutaneous and intramuscular edema and hemorrhage with several foci of active bleeding deep to the laceration with intact axillary brachial radial and ulnar arteries.  Resuscitated, transferred to [facility] and taken to operating room by hand surgery, debridement and closure of the wound.  Subjective: Patient seen and examined.  Moderate pain mostly on the dorsum of the hand with some swelling of the fingers.  Denies any other complaints.  Agreeable to go to a skilled rehab.  Hemoglobin is stable after 2 units of transfusion.  Assessment & Plan:   Traumatic laceration of the right forearm with hematoma: Status post exploration, evacuation of hematoma and repair of skin laceration 12/4 by Dr. Murrell Posterior splint, dressing intact.  Advised not to remove until clinic follow-up in 1 week. Doxycycline  for 1 week Received Tdap Continue to mobilize,, elevate the arm.  PT OT.  Acute blood loss anemia: Coagulopathic on Eliquis . Recent baseline hemoglobin 10-11.  Presented with hemoglobin of 10.2.  Hemoglobin dropped to  6.1.  No evidence of ongoing bleeding. Given 2 units of PRBC with appropriate improvement.  Will hold Eliquis  until outpatient follow-up.  Paroxysmal A-fib: Rate controlled.  Apparently not on any beta-blockers.  Eliquis  on hold given significant bleeding.  Will continue to hold until surgical wound exam on follow-up.  Hypertension: Blood pressure is stable.  Resume amlodipine ,  hydralazine  and irbesartan.  Benign positional vertigo: Work with PT OT.  Depression anxiety: On Cymbalta .  Continue.  Acute kidney injury with ATN: Due to acute blood loss.  Treated with IV fluids.  Improved.   Hypomagnesemia: Will keep on magnesium  replacement.  DVT prophylaxis: SCD's Start: 05/18/24 1815 Place TED hose Start: 05/18/24 0815   Code Status: Full code Family Communication: None at the bedside Disposition Plan: Status is: Inpatient Remains inpatient appropriate because: Medically stable.  Transfer to SNF when bed available.     Consultants:  Hand surgery  Procedures:  Debridement and repair of the right hand injury  Antimicrobials:  Preoperative, doxycycline      Objective: Vitals:   05/19/24 2333 05/20/24 0500 05/20/24 0748 05/20/24 0748  BP: (!) 114/47 (!) 135/53 (!) 141/56 (!) 141/56  Pulse: 60 65 64 64  Resp: 16 16 16 16   Temp: 98.2 F (36.8 C) 98.7 F (37.1 C) 97.8 F (36.6 C) 97.8 F (36.6 C)  TempSrc:  Oral Oral Oral  SpO2: 94% 95% 91% 91%  Weight:      Height:        Intake/Output Summary (Last 24 hours) at 05/20/2024 0959 Last data filed at 05/19/2024 2340 Gross per 24 hour  Intake 1141.5 ml  Output --  Net 1141.5 ml   Filed Weights   05/17/24 2317  Weight: 60.3 kg    Examination:  General exam: Pleasant and interactive. Respiratory system: Clear to auscultation. Respiratory effort normal.  No added sounds. Cardiovascular system: S1 & S2 heard, RRR.  Gastrointestinal system: Abdomen is nondistended, soft and nontender. No organomegaly or masses felt. Normal bowel sounds heard. Central nervous  system: Alert and oriented. No focal neurological deficits. Extremities: Symmetric 5 x 5 power. Skin:  Right arm on splint , swelling of the fingers but intact neurological and vascular status.  Able to move.    Data Reviewed: I have personally reviewed following labs and imaging studies  CBC: Recent Labs  Lab 05/18/24 0010  05/18/24 0021 05/18/24 0549 05/19/24 0515 05/19/24 2341 05/20/24 0325  WBC 9.8  --  12.2* 8.6  --  8.6  NEUTROABS  --   --   --   --   --  6.7  HGB 10.8* 10.2* 8.4* 6.1* 8.3* 8.9*  HCT 31.1* 30.0* 24.6* 17.8* 24.0* 25.7*  MCV 91.7  --  90.8 92.2  --  86.8  PLT 308  --  274 187  --  178   Basic Metabolic Panel: Recent Labs  Lab 05/18/24 0010 05/18/24 0021 05/19/24 0515 05/20/24 0325  NA 139 140 138 134*  K 4.2 4.2 4.3 4.1  CL 109 108 108 106  CO2 20*  --  20* 22  GLUCOSE 137* 134* 97 97  BUN 22 22 19 19   CREATININE 1.57* 1.80* 1.11* 1.30*  CALCIUM  9.8  --  8.7* 8.9  MG  --   --   --  1.6*   GFR: Estimated Creatinine Clearance: 21.7 mL/min (A) (by C-G formula based on SCr of 1.3 mg/dL (H)). Liver Function Tests: Recent Labs  Lab 05/20/24 0325  AST 24  ALT 6  ALKPHOS 56  BILITOT 0.7  PROT 4.8*  ALBUMIN 2.7*   No results for input(s): LIPASE, AMYLASE in the last 168 hours. No results for input(s): AMMONIA in the last 168 hours. Coagulation Profile: No results for input(s): INR, PROTIME in the last 168 hours. Cardiac Enzymes: No results for input(s): CKTOTAL, CKMB, CKMBINDEX, TROPONINI in the last 168 hours. BNP (last 3 results) No results for input(s): PROBNP in the last 8760 hours. HbA1C: No results for input(s): HGBA1C in the last 72 hours. CBG: No results for input(s): GLUCAP in the last 168 hours. Lipid Profile: No results for input(s): CHOL, HDL, LDLCALC, TRIG, CHOLHDL, LDLDIRECT in the last 72 hours. Thyroid  Function Tests: No results for input(s): TSH, T4TOTAL, FREET4, T3FREE, THYROIDAB in the last 72 hours. Anemia Panel: No results for input(s): VITAMINB12, FOLATE, FERRITIN, TIBC, IRON, RETICCTPCT in the last 72 hours. Sepsis Labs: No results for input(s): PROCALCITON, LATICACIDVEN in the last 168 hours.  Recent Results (from the past 240 hours)  Surgical pcr screen     Status: None    Collection Time: 05/18/24  1:58 PM   Specimen: Nasal Mucosa; Nasal Swab  Result Value Ref Range Status   MRSA, PCR NEGATIVE NEGATIVE Final   Staphylococcus aureus NEGATIVE NEGATIVE Final    Comment: (NOTE) The Xpert SA Assay (FDA approved for NASAL specimens in patients 73 years of age and older), is one component of a comprehensive surveillance program. It is not intended to diagnose infection nor to guide or monitor treatment. Performed at [facility], [address] [place], [place] [address]          Radiology Studies: DG MINI C-ARM IMAGE ONLY Result Date: 05/18/2024 There is no interpretation for this exam.  This order is for images obtained during a surgical procedure.  Please See Surgeries Tab for more information regarding the procedure.        Scheduled Meds:  amLODipine   10 mg Oral Daily   atorvastatin   40 mg Oral QPM  doxycycline   100 mg Oral Q12H   DULoxetine   30 mg Oral Daily   ferrous sulfate   325 mg Oral Once per day on Monday Wednesday Friday   hydrALAZINE   50 mg Oral Q8H   magnesium  oxide  400 mg Oral Daily   melatonin  5 mg Oral QHS   Continuous Infusions:   LOS: 2 days      Brently Voorhis, MD [facility]

## 2024-05-20 NOTE — NC FL2 (Signed)
 [place]  MEDICAID FL2 LEVEL OF CARE FORM     IDENTIFICATION  Patient Name: Tabitha Sanders Birthdate: 1925-09-08 Sex: female Admission Date (Current Location): 05/17/2024  [facility] and [place] Number:  Producer, Television/film/video and Address:  [facility], [address], [place], [place] [address]      Provider Number: [id number]  Attending Physician Name and Address:  Raenelle Coria, MD  Relative Name and Phone Number:  Kenedee Molesky: [phone]    Current Level of Care: Hospital Recommended Level of Care: Skilled Nursing Facility Prior Approval Number:    Date Approved/Denied:   PASRR Number: [id number]  Discharge Plan: SNF    Current Diagnoses: Patient Active Problem List   Diagnosis Date Noted   This 05/19/2024   Laceration of arm, right, initial encounter 05/18/2024   Depression 05/18/2024   Paroxysmal atrial fibrillation (HCC) 12/13/2017   Subarachnoid bleed (HCC) 11/17/2017   Head trauma 11/17/2017   Essential hypertension 11/17/2017   Syncope and collapse 11/17/2017   Hyperlipidemia 11/17/2017   Cardiomegaly 11/17/2017   Brain aneurysm 05/20/2015   Intracranial aneurysm    Aneurysm, carotid artery, internal    Cerebral infarction due to occlusion of other cerebral artery (HCC)    Benign paroxysmal positional vertigo    Stroke (HCC) 01/25/2015    Orientation RESPIRATION BLADDER Height & Weight     Self, Time, Situation, Place  Normal Continent Weight: 132 lb 15 oz (60.3 kg) Height:  5' 2 (157.5 cm)  BEHAVIORAL SYMPTOMS/MOOD NEUROLOGICAL BOWEL NUTRITION STATUS      Continent Diet (Please refer to DC summary)  AMBULATORY STATUS COMMUNICATION OF NEEDS Skin   Limited Assist Verbally Normal                       Personal Care Assistance Level of Assistance  Bathing, Feeding, Dressing Bathing Assistance: Limited assistance Feeding assistance: Independent Dressing Assistance: Limited assistance     Functional  Limitations Info  Sight, Hearing, Speech Sight Info: Adequate (eyeglasses) Hearing Info: Impaired (impaired hearing both ears) Speech Info: Adequate    SPECIAL CARE FACTORS FREQUENCY  PT (By licensed PT), OT (By licensed OT)     PT Frequency: 5x/week OT Frequency: 5x/week            Contractures Contractures Info: Not present    Additional Factors Info  Code Status, Allergies Code Status Info: Full Allergies Info: Codeine, lisinopril, lidocaine -epinephrine            Current Medications (05/20/2024):  This is the current hospital active medication list Current Facility-Administered Medications  Medication Dose Route Frequency Provider Last Rate Last Admin   acetaminophen  (TYLENOL ) tablet 650 mg  650 mg Oral Q6H PRN Kuzma, Kevin, MD   650 mg at 05/19/24 2137   Or   acetaminophen  (TYLENOL ) suppository 650 mg  650 mg Rectal Q6H PRN Kuzma, Kevin, MD       amLODipine  (NORVASC ) tablet 10 mg  10 mg Oral Daily Kuzma, Kevin, MD   10 mg at 05/20/24 0951   atorvastatin  (LIPITOR) tablet 40 mg  40 mg Oral QPM Kuzma, Kevin, MD   40 mg at 05/19/24 1754   doxycycline  (VIBRA -TABS) tablet 100 mg  100 mg Oral Q12H Kuzma, Kevin, MD   100 mg at 05/20/24 9047   DULoxetine  (CYMBALTA ) DR capsule 30 mg  30 mg Oral Daily Kuzma, Kevin, MD   30 mg at 05/20/24 0951   ferrous sulfate  tablet 325 mg  325 mg Oral  Once per day on Monday Wednesday Friday Murrell Drivers, MD   325 mg at 05/19/24 9142   hydrALAZINE  (APRESOLINE ) injection 5 mg  5 mg Intravenous Q6H PRN Kuzma, Kevin, MD       hydrALAZINE  (APRESOLINE ) tablet 50 mg  50 mg Oral Q8H Kuzma, Kevin, MD   50 mg at 05/19/24 9473   HYDROcodone -acetaminophen  (NORCO/VICODIN) 5-325 MG per tablet 1-2 tablet  1-2 tablet Oral Q4H PRN Kuzma, Kevin, MD   2 tablet at 05/20/24 0529   magnesium  oxide (MAG-OX) tablet 400 mg  400 mg Oral Daily Ghimire, Kuber, MD   400 mg at 05/20/24 9047   melatonin tablet 5 mg  5 mg Oral QHS Kuzma, Kevin, MD   5 mg at 05/19/24 2137    ondansetron  (ZOFRAN ) tablet 4 mg  4 mg Oral Q6H PRN Kuzma, Kevin, MD       Or   ondansetron  (ZOFRAN ) injection 4 mg  4 mg Intravenous Q6H PRN Kuzma, Kevin, MD   4 mg at 05/18/24 9071   senna-docusate (Senokot-S) tablet 1 tablet  1 tablet Oral QHS PRN Kuzma, Kevin, MD         Discharge Medications: Please see discharge summary for a list of discharge medications.  Relevant Imaging Results:  Relevant Lab Results:   Additional Information Pt's SSN: [id number]  Sherline Clack, [place]

## 2024-05-20 NOTE — TOC Progression Note (Addendum)
 Transition of Care [facility]) - Progression Note    Patient Details  Name: Tabitha Sanders MRN: [id number] Date of Birth: 1926-02-06  Transition of Care [facility]) CM/SW Contact  Sherline Clack, [place] Phone Number: 05/20/2024, 12:33 PM  Clinical Narrative:     FL2 completed and faxed out to facilities in the hub. CSW to follow up with family for choice once facilities have responded.   Expected Discharge Plan: Skilled Nursing Facility Barriers to Discharge: Continued Medical Work up, Other (must enter comment) (pending PT eval)               Expected Discharge Plan and Services In-house Referral: Clinical Social Work     Living arrangements for the past 2 months: Single Family Home                                       Social Drivers of Health (SDOH) Interventions SDOH Screenings   Food Insecurity: No Food Insecurity (05/18/2024)  Housing: Low Risk  (05/18/2024)  Transportation Needs: No Transportation Needs (05/18/2024)  Utilities: Not At Risk (05/18/2024)  Social Connections: Moderately Isolated (05/18/2024)  Tobacco Use: Low Risk  (05/18/2024)    Readmission Risk Interventions     No data to display

## 2024-05-21 DIAGNOSIS — S41111A Laceration without foreign body of right upper arm, initial encounter: Secondary | ICD-10-CM | POA: Diagnosis not present

## 2024-05-21 MED ORDER — POLYETHYLENE GLYCOL 3350 17 G PO PACK
17.0000 g | PACK | Freq: Every day | ORAL | Status: DC
  Administered 2024-05-21 – 2024-05-23 (×3): 17 g via ORAL
  Filled 2024-05-21 (×3): qty 1

## 2024-05-21 MED ORDER — SENNOSIDES-DOCUSATE SODIUM 8.6-50 MG PO TABS
1.0000 | ORAL_TABLET | Freq: Two times a day (BID) | ORAL | Status: DC
  Administered 2024-05-21 – 2024-05-23 (×5): 1 via ORAL
  Filled 2024-05-21 (×5): qty 1

## 2024-05-21 NOTE — Progress Notes (Signed)
 PROGRESS NOTE    Tabitha Sanders  FMW:[id number] DOB: [birth year] DOA: 05/17/2024 PCP: Charlott Dorn LABOR, MD    Brief Narrative:  89 year old with A-fib on Eliquis , anemia of chronic disease, essential hypertension, hyperlipidemia, CVA and polymyalgia rheumatica presented to the ER with of fall, she hit her right arm on a metal part of a bed rail causing laceration and hematoma.  In the emergency room skeletal survey negative.  Head CT normal.  CT angiogram right upper extremity with subcutaneous and intramuscular edema and hemorrhage with several foci of active bleeding deep to the laceration with intact axillary brachial radial and ulnar arteries.  Resuscitated, transferred to [facility] and taken to operating room by hand surgery, debridement and closure of the wound.  Subjective:  Patient seen and examined.  Occasional discomfort on the arm otherwise moving fingers better today.  No other overnight events.  No bowel movements for last 4 days.  Assessment & Plan:   Traumatic laceration of the right forearm with hematoma: Status post exploration, evacuation of hematoma and repair of skin laceration 12/4 by Dr. Murrell Posterior splint, dressing intact.  Advised not to remove until clinic follow-up in 1 week. Doxycycline  for 1 week Received Tdap Continue to mobilize,, elevate the arm.  PT OT.  Acute blood loss anemia: Coagulopathic on Eliquis . Recent baseline hemoglobin 10-11.  Presented with hemoglobin of 10.2.  Hemoglobin dropped to  6.1.  No evidence of ongoing bleeding. Given 2 units of PRBC with appropriate improvement.  Will hold Eliquis  until outpatient follow-up. Recheck hemoglobin tomorrow morning.  Paroxysmal A-fib: Rate controlled.  Apparently not on any beta-blockers.  Eliquis  on hold given significant bleeding.  Will continue to hold until surgical wound exam on follow-up.  Hypertension: Blood pressure is stable.  Resumed amlodipine , hydralazine  and  irbesartan.  Benign positional vertigo: Work with PT OT.  Depression anxiety: On Cymbalta .  Continue.  Acute kidney injury with ATN: Due to acute blood loss.  Treated with IV fluids.  Improved.  Recheck tomorrow morning.  Hypomagnesemia: Will keep on magnesium  replacement.  DVT prophylaxis: SCD's Start: 05/18/24 1815 Place TED hose Start: 05/18/24 0815   Code Status: Full code Family Communication: None at the bedside Disposition Plan: Status is: Inpatient Remains inpatient appropriate because: Medically stable.  Transfer to SNF when bed available.     Consultants:  Hand surgery  Procedures:  Debridement and repair of the right hand injury  Antimicrobials:  Preoperative, doxycycline      Objective: Vitals:   05/20/24 2016 05/21/24 0500 05/21/24 0704 05/21/24 0746  BP: (!) 133/51 (!) 143/63 (!) 142/65 (!) 130/59  Pulse: 69 78 70 70  Resp: 16 16  15   Temp: 98.8 F (37.1 C) 98.5 F (36.9 C)  97.7 F (36.5 C)  TempSrc: Oral Oral  Oral  SpO2: 95% 97%  94%  Weight:      Height:        Intake/Output Summary (Last 24 hours) at 05/21/2024 1005 Last data filed at 05/20/2024 1700 Gross per 24 hour  Intake 120 ml  Output --  Net 120 ml   Filed Weights   05/17/24 2317  Weight: 60.3 kg    Examination:  General exam: Age-appropriate.  Pleasant and interactive.  Alert awake and oriented. Respiratory system: Clear to auscultation. Respiratory effort normal.  No added sounds. Cardiovascular system: S1 & S2 heard, RRR.  Gastrointestinal system: Abdomen is nondistended, soft and nontender. No organomegaly or masses felt. Normal bowel sounds heard. Skin:  Right arm  on splint , receding swelling of the fingers but intact neurological and vascular status.  Able to move.    Data Reviewed: I have personally reviewed following labs and imaging studies  CBC: Recent Labs  Lab 05/18/24 0010 05/18/24 0021 05/18/24 0549 05/19/24 0515 05/19/24 2341 05/20/24 0325  WBC  9.8  --  12.2* 8.6  --  8.6  NEUTROABS  --   --   --   --   --  6.7  HGB 10.8* 10.2* 8.4* 6.1* 8.3* 8.9*  HCT 31.1* 30.0* 24.6* 17.8* 24.0* 25.7*  MCV 91.7  --  90.8 92.2  --  86.8  PLT 308  --  274 187  --  178   Basic Metabolic Panel: Recent Labs  Lab 05/18/24 0010 05/18/24 0021 05/19/24 0515 05/20/24 0325  NA 139 140 138 134*  K 4.2 4.2 4.3 4.1  CL 109 108 108 106  CO2 20*  --  20* 22  GLUCOSE 137* 134* 97 97  BUN 22 22 19 19   CREATININE 1.57* 1.80* 1.11* 1.30*  CALCIUM  9.8  --  8.7* 8.9  MG  --   --   --  1.6*   GFR: Estimated Creatinine Clearance: 21.7 mL/min (A) (by C-G formula based on SCr of 1.3 mg/dL (H)). Liver Function Tests: Recent Labs  Lab 05/20/24 0325  AST 24  ALT 6  ALKPHOS 56  BILITOT 0.7  PROT 4.8*  ALBUMIN 2.7*   No results for input(s): LIPASE, AMYLASE in the last 168 hours. No results for input(s): AMMONIA in the last 168 hours. Coagulation Profile: No results for input(s): INR, PROTIME in the last 168 hours. Cardiac Enzymes: No results for input(s): CKTOTAL, CKMB, CKMBINDEX, TROPONINI in the last 168 hours. BNP (last 3 results) No results for input(s): PROBNP in the last 8760 hours. HbA1C: No results for input(s): HGBA1C in the last 72 hours. CBG: No results for input(s): GLUCAP in the last 168 hours. Lipid Profile: No results for input(s): CHOL, HDL, LDLCALC, TRIG, CHOLHDL, LDLDIRECT in the last 72 hours. Thyroid  Function Tests: No results for input(s): TSH, T4TOTAL, FREET4, T3FREE, THYROIDAB in the last 72 hours. Anemia Panel: No results for input(s): VITAMINB12, FOLATE, FERRITIN, TIBC, IRON, RETICCTPCT in the last 72 hours. Sepsis Labs: No results for input(s): PROCALCITON, LATICACIDVEN in the last 168 hours.  Recent Results (from the past 240 hours)  Surgical pcr screen     Status: None   Collection Time: 05/18/24  1:58 PM   Specimen: Nasal Mucosa; Nasal Swab  Result  Value Ref Range Status   MRSA, PCR NEGATIVE NEGATIVE Final   Staphylococcus aureus NEGATIVE NEGATIVE Final    Comment: (NOTE) The Xpert SA Assay (FDA approved for NASAL specimens in patients 55 years of age and older), is one component of a comprehensive surveillance program. It is not intended to diagnose infection nor to guide or monitor treatment. Performed at [facility], [address], [place], [place] [address]          Radiology Studies: No results found.       Scheduled Meds:  amLODipine   10 mg Oral Daily   atorvastatin   40 mg Oral QPM   doxycycline   100 mg Oral Q12H   DULoxetine   30 mg Oral Daily   ferrous sulfate   325 mg Oral Once per day on Monday Wednesday Friday   hydrALAZINE   50 mg Oral Q8H   magnesium  oxide  400 mg Oral Daily   melatonin  5 mg  Oral QHS   polyethylene glycol  17 g Oral Daily   senna-docusate  1 tablet Oral BID   Continuous Infusions:   LOS: 3 days      Renato Applebaum, MD [facility]

## 2024-05-21 NOTE — Progress Notes (Signed)
 Mobility Specialist Progress Note:    05/21/24 1229  Mobility  Activity Ambulated with assistance (In hallway)  Level of Assistance Contact guard assist, steadying assist (+ Chair follow)  Assistive Device Other (Comment) (HHA)  Distance Ambulated (ft) 12 ft (+28)  RUE Weight Bearing Per Provider Order NWB  Activity Response Tolerated well  Mobility Referral Yes  Mobility visit 1 Mobility  Mobility Specialist Start Time (ACUTE ONLY) 1210  Mobility Specialist Stop Time (ACUTE ONLY) 1222  Mobility Specialist Time Calculation (min) (ACUTE ONLY) 12 min   Received pt in chair and eager for mobility. Pt required MinG and chair follow for safety. Pt had x2 seated rest breaks. C/o feeling unsteady. Pt returned to room via chair with alarm on. Personal belongings and call light within reach. All needs met.  Lavanda Pollack Mobility Specialist  Please contact via [employer] or  Rehab Office [phone]

## 2024-05-21 NOTE — Plan of Care (Signed)

## 2024-05-22 DIAGNOSIS — S41111A Laceration without foreign body of right upper arm, initial encounter: Secondary | ICD-10-CM | POA: Diagnosis not present

## 2024-05-22 LAB — CBC WITH DIFFERENTIAL/PLATELET
Abs Immature Granulocytes: 0.02 K/uL (ref 0.00–0.07)
Basophils Absolute: 0 K/uL (ref 0.0–0.1)
Basophils Relative: 1 %
Eosinophils Absolute: 0.2 K/uL (ref 0.0–0.5)
Eosinophils Relative: 3 %
HCT: 27.8 % — ABNORMAL LOW (ref 36.0–46.0)
Hemoglobin: 9.6 g/dL — ABNORMAL LOW (ref 12.0–15.0)
Immature Granulocytes: 0 %
Lymphocytes Relative: 13 %
Lymphs Abs: 0.8 K/uL (ref 0.7–4.0)
MCH: 30.1 pg (ref 26.0–34.0)
MCHC: 34.5 g/dL (ref 30.0–36.0)
MCV: 87.1 fL (ref 80.0–100.0)
Monocytes Absolute: 0.6 K/uL (ref 0.1–1.0)
Monocytes Relative: 9 %
Neutro Abs: 4.7 K/uL (ref 1.7–7.7)
Neutrophils Relative %: 74 %
Platelets: 261 K/uL (ref 150–400)
RBC: 3.19 MIL/uL — ABNORMAL LOW (ref 3.87–5.11)
RDW: 15.9 % — ABNORMAL HIGH (ref 11.5–15.5)
WBC: 6.3 K/uL (ref 4.0–10.5)
nRBC: 0 % (ref 0.0–0.2)

## 2024-05-22 LAB — BASIC METABOLIC PANEL WITH GFR
Anion gap: 6 (ref 5–15)
BUN: 19 mg/dL (ref 8–23)
CO2: 25 mmol/L (ref 22–32)
Calcium: 9.1 mg/dL (ref 8.9–10.3)
Chloride: 107 mmol/L (ref 98–111)
Creatinine, Ser: 1.13 mg/dL — ABNORMAL HIGH (ref 0.44–1.00)
GFR, Estimated: 45 mL/min — ABNORMAL LOW (ref >60)
Glucose, Bld: 103 mg/dL — ABNORMAL HIGH (ref 70–99)
Potassium: 4.3 mmol/L (ref 3.5–5.1)
Sodium: 138 mmol/L (ref 135–145)

## 2024-05-22 NOTE — TOC Progression Note (Addendum)
 Transition of Care [facility]) - Progression Note    Patient Details  Name: Tabitha Sanders MRN: [id number] Date of Birth: [birth year]  Transition [facility]) CM/SW Contact  Bridget Cordella Simmonds, LCSW Phone Number: 05/22/2024, 11:51 AM  Clinical Narrative:   Bed offers provided to pt on medicare choice document.  She asked CSW to call her daughter Holley.  Bed offers also provided to [facility] over the phone.  They will review.   1500: [name] would like to accept offer at [facility]  CSW confirmed with [facility]: they can receive pt.  New PT note needed for insurance auth.  PT aware.   Expected Discharge Plan: Skilled Nursing Facility Barriers to Discharge: Continued Medical Work up, Other (must enter comment) (pending PT eval)               Expected Discharge Plan and Services In-house Referral: Clinical Social Work     Living arrangements for the past 2 months: Single Family Home                                       Social Drivers of Health (SDOH) Interventions SDOH Screenings   Food Insecurity: No Food Insecurity (05/18/2024)  Housing: Low Risk  (05/18/2024)  Transportation Needs: No Transportation Needs (05/18/2024)  Utilities: Not At Risk (05/18/2024)  Social Connections: Moderately Isolated (05/18/2024)  Tobacco Use: Low Risk  (05/18/2024)    Readmission Risk Interventions     No data to display

## 2024-05-22 NOTE — Progress Notes (Signed)
 Mobility Specialist: Progress Note   05/22/24 1200  Mobility  Activity Ambulated with assistance  Level of Assistance Minimal assist, patient does 75% or more  Assistive Device Other (Comment) (HHA)  Distance Ambulated (ft) 25 ft (x2)  RUE Weight Bearing Per Provider Order NWB  Activity Response Tolerated well  Mobility Referral Yes  Mobility visit 1 Mobility  Mobility Specialist Start Time (ACUTE ONLY) 1002  Mobility Specialist Stop Time (ACUTE ONLY) 1017  Mobility Specialist Time Calculation (min) (ACUTE ONLY) 15 min    Pt received in chair, very pleasant and agreeable to mobility session. A little hesitant without chair follow but motivated to try. MinA for STS and ambulation to steady. Min cues needed for forward gaze and posture. Ambulated to the door and back twice with a seated rest in between. Tolerated well. Left in chair with RUE elevated, all needs met, call bell in reach. Chair alarm on.  Tabitha Sanders Mobility Specialist Please contact via [employer] or Rehab office at [phone]

## 2024-05-22 NOTE — Progress Notes (Signed)
 Physical Therapy Treatment Patient Details Name: Tabitha Sanders MRN: [id number] DOB: 09-Jan-1926 Today's Date: 05/22/2024   History of Present Illness Randy Whitener is a 89 y.o. female admitted 05/17/24 for fall and hitting of the right arm on a metal part of a bed rail causing laceration. Pt s/p R forearm exploration and evacuation of hematoma and repair of skin laceration 12/4. PMHx: atrial fibrillation on Eliquis , anemia of chronic disease, essential hypertension, history of cerebral aneurysm repair, hyperlipidemia, CVA, and polymyalgia rheumatica.    PT Comments  Pt making good progress with mobility. Amb using rollator with LUE only which isn't ideal but she moved better than when trying to amb with hemiwalker which isn't familiar to her. Once she is given clearance to bear a little weight on her RUE the rollator will be more functional. Patient will benefit from continued inpatient follow up therapy, <3 hours/day.    If plan is discharge home, recommend the following: A lot of help with bathing/dressing/bathroom;Assistance with cooking/housework;Assist for transportation;Help with stairs or ramp for entrance;A little help with walking and/or transfers   Can travel by private vehicle     Yes  Equipment Recommendations  Wheelchair (measurements PT);Wheelchair cushion (measurements PT);BSC/3in1    Recommendations for Other Services       Precautions / Restrictions Precautions Precautions: Fall Recall of Precautions/Restrictions: Impaired Required Braces or Orthoses: Splint/Cast Splint/Cast: R UE posterior splint wrapped with Kerlix and Ace bandage Splint/Cast - Date Prophylactic Dressing Applied (if applicable): 05/18/24 Restrictions Weight Bearing Restrictions Per Provider Order: Yes RUE Weight Bearing Per Provider Order: Non weight bearing     Mobility  Bed Mobility Overal bed mobility: Needs Assistance Bed Mobility: Supine to Sit, Sit to Supine     Supine to sit:  Supervision, HOB elevated, Used rails Sit to supine: Supervision   General bed mobility comments: supervision for safety    Transfers Overall transfer level: Needs assistance Equipment used: 1 person hand held assist, Rollator (4 wheels), Hemi-walker Transfers: Sit to/from Stand, Bed to chair/wheelchair/BSC Sit to Stand: Min assist (assist to power up and for balance)   Step pivot transfers: Min assist       General transfer comment: Assist to power up and for balance.    Ambulation/Gait Ambulation/Gait assistance: +2 safety/equipment, Min assist (Chair Follow) Gait Distance (Feet): 80 Feet (80' x 1, 10' x 1) Assistive device: Rollator (4 wheels), Hemi-walker (Used rollator with LUE only) Gait Pattern/deviations: Step-to pattern, Decreased step length - right, Decreased step length - left, Narrow base of support, Decreased stride length Gait velocity: decreased Gait velocity interpretation: <1.8 ft/sec, indicate of risk for recurrent falls   General Gait Details: Used rollator initially with pt using her LUE only. Assist for balance and occasionally assist manuvering the rollator. With hemiwalker assist for balance and support and pt with very short, cautious steps   Stairs             Wheelchair Mobility     Tilt Bed    Modified Rankin (Stroke Patients Only)       Balance Overall balance assessment: Needs assistance, History of Falls Sitting-balance support: Feet supported, No upper extremity supported Sitting balance-Leahy Scale: Fair     Standing balance support: During functional activity, Single extremity supported Standing balance-Leahy Scale: Poor Standing balance comment: UE support and CGA to min assist for static standing  Communication Communication Communication: Impaired Factors Affecting Communication: Hearing impaired  Cognition Arousal: Alert Behavior During Therapy: WFL for tasks assessed/performed    PT - Cognitive impairments: Awareness, Safety/Judgement, Problem solving                       PT - Cognition Comments: I think pt primarily is having difficulty having to do things differently since she can't use her RUE and that is her dominant arm. Following commands: Impaired Following commands impaired: Only follows one step commands consistently    Cueing Cueing Techniques: Verbal cues, Gestural cues, Tactile cues  Exercises      General Comments        Pertinent Vitals/Pain Pain Assessment Pain Assessment: Faces Faces Pain Scale: Hurts a little bit Pain Location: R UE Pain Descriptors / Indicators: Discomfort, Sore Pain Intervention(s): Limited activity within patient's tolerance    Home Living                          Prior Function            PT Goals (current goals can now be found in the care plan section) Acute Rehab PT Goals Patient Stated Goal: Return Home PT Goal Formulation: With patient/family Time For Goal Achievement: 06/02/24 Potential to Achieve Goals: Fair Progress towards PT goals: Progressing toward goals;Goals updated    Frequency    Min 2X/week      PT Plan      Co-evaluation              AM-PAC PT 6 Clicks Mobility   Outcome Measure  Help needed turning from your back to your side while in a flat bed without using bedrails?: A Little Help needed moving from lying on your back to sitting on the side of a flat bed without using bedrails?: A Little Help needed moving to and from a bed to a chair (including a wheelchair)?: A Little Help needed standing up from a chair using your arms (e.g., wheelchair or bedside chair)?: A Little Help needed to walk in hospital room?: A Little Help needed climbing 3-5 steps with a railing? : A Lot 6 Click Score: 17    End of Session Equipment Utilized During Treatment: Gait belt Activity Tolerance: Patient tolerated treatment well Patient left: with call bell/phone  within reach;in bed;with bed alarm set   PT Visit Diagnosis: Difficulty in walking, not elsewhere classified (R26.2);Muscle weakness (generalized) (M62.81);Other abnormalities of gait and mobility (R26.89);Unsteadiness on feet (R26.81);History of falling (Z91.81)     Time: 8467-8447 PT Time Calculation (min) (ACUTE ONLY): 20 min  Charges:    $Gait Training: 8-22 mins PT General Charges $$ ACUTE PT VISIT: 1 Visit                     [facility] PT Acute Rehabilitation Services Office [phone]    [place] ORN [facility] 05/22/2024, 4:16 PM

## 2024-05-22 NOTE — Care Management Important Message (Signed)
 Important Message  Patient Details  Name: Tabitha Sanders MRN: [id number] Date of Birth: 1926-04-16   Important Message Given:  Yes - Medicare IM     Tabitha Sanders 05/22/2024, 12:31 PM

## 2024-05-22 NOTE — Progress Notes (Signed)
 PROGRESS NOTE    Tabitha Sanders  FMW:[id number] DOB: 07/09/1925 DOA: 05/17/2024 PCP: Charlott Dorn LABOR, MD    Brief Narrative:  89 year old with A-fib on Eliquis , anemia of chronic disease, essential hypertension, hyperlipidemia, CVA and polymyalgia rheumatica presented to the ER with of fall, she hit her right arm on a metal part of a bed rail causing laceration and hematoma.  In the emergency room skeletal survey negative.  Head CT normal.  CT angiogram right upper extremity with subcutaneous and intramuscular edema and hemorrhage with several foci of active bleeding deep to the laceration with intact axillary brachial radial and ulnar arteries.  Resuscitated, transferred to [facility] and taken to operating room by hand surgery, debridement and closure of the wound.  Subjective: Patient seen and examined.  No overnight events.  Mild pain persist.  Moving her shoulder and fingers better today.  Hemoglobin is stable.  She is hoping to get out of the hospital today.  Assessment & Plan:   Traumatic laceration of the right forearm with hematoma: Status post exploration, evacuation of hematoma and repair of skin laceration 12/4 by Dr. Murrell Posterior splint, dressing intact.  Advised not to remove until clinic follow-up in 1 week. Doxycycline  for 1 week Received Tdap Continue to mobilize,, elevate the arm.  PT OT.  Acute blood loss anemia: Coagulopathic on Eliquis . Recent baseline hemoglobin 10-11.  Presented with hemoglobin of 10.2.  Hemoglobin dropped to  6.1.  No evidence of ongoing bleeding. Given 2 units of PRBC with appropriate improvement.  Will hold Eliquis  until outpatient follow-up.  Paroxysmal A-fib: Rate controlled.  Apparently not on any beta-blockers.  Eliquis  on hold given significant bleeding.  Will continue to hold until surgical wound exam on follow-up.  Hypertension: Blood pressure is stable.  Resumed amlodipine , hydralazine  and irbesartan.  Benign  positional vertigo: Work with PT OT.  Depression anxiety: On Cymbalta .  Continue.  Acute kidney injury with ATN: Due to acute blood loss.  Treated with IV fluids.  Improved.   Hypomagnesemia: Will keep on magnesium  replacement.  DVT prophylaxis: SCD's Start: 05/18/24 1815 Place TED hose Start: 05/18/24 0815   Code Status: Full code Family Communication: None at the bedside Disposition Plan: Status is: Inpatient Remains inpatient appropriate because: Medically stable.  Transfer to SNF when bed available.     Consultants:  Hand surgery  Procedures:  Debridement and repair of the right hand injury  Antimicrobials:  Preoperative, doxycycline      Objective: Vitals:   05/21/24 0746 05/21/24 2038 05/22/24 0512 05/22/24 0818  BP: (!) 130/59 (!) 137/57 133/60 130/65  Pulse: 70 91 70 91  Resp: 15 16 15 18   Temp: 97.7 F (36.5 C) 97.8 F (36.6 C) 97.8 F (36.6 C) 98 F (36.7 C)  TempSrc: Oral Oral Oral   SpO2: 94%  97% 97%  Weight:      Height:        Intake/Output Summary (Last 24 hours) at 05/22/2024 0931 Last data filed at 05/22/2024 0850 Gross per 24 hour  Intake 240 ml  Output --  Net 240 ml   Filed Weights   05/17/24 2317  Weight: 60.3 kg    Examination:  General exam: Pleasant interactive.  Sitting in chair and eating breakfast. Respiratory system: Clear to auscultation. Respiratory effort normal.  No added sounds. Cardiovascular system: S1 & S2 heard, RRR.  Gastrointestinal system: Abdomen is nondistended, soft and nontender. No organomegaly or masses felt. Normal bowel sounds heard. Central nervous system: Alert and  oriented. No focal neurological deficits. Extremities: Symmetric 5 x 5 power. Skin:  Right arm on splint , improved swelling of the fingers.    Data Reviewed: I have personally reviewed following labs and imaging studies  CBC: Recent Labs  Lab 05/18/24 0010 05/18/24 0021 05/18/24 0549 05/19/24 0515 05/19/24 2341 05/20/24 0325  05/22/24 0522  WBC 9.8  --  12.2* 8.6  --  8.6 6.3  NEUTROABS  --   --   --   --   --  6.7 4.7  HGB 10.8*   < > 8.4* 6.1* 8.3* 8.9* 9.6*  HCT 31.1*   < > 24.6* 17.8* 24.0* 25.7* 27.8*  MCV 91.7  --  90.8 92.2  --  86.8 87.1  PLT 308  --  274 187  --  178 261   < > = values in this interval not displayed.   Basic Metabolic Panel: Recent Labs  Lab 05/18/24 0010 05/18/24 0021 05/19/24 0515 05/20/24 0325 05/22/24 0522  NA 139 140 138 134* 138  K 4.2 4.2 4.3 4.1 4.3  CL 109 108 108 106 107  CO2 20*  --  20* 22 25  GLUCOSE 137* 134* 97 97 103*  BUN 22 22 19 19 19   CREATININE 1.57* 1.80* 1.11* 1.30* 1.13*  CALCIUM  9.8  --  8.7* 8.9 9.1  MG  --   --   --  1.6*  --    GFR: Estimated Creatinine Clearance: 24.9 mL/min (A) (by C-G formula based on SCr of 1.13 mg/dL (H)). Liver Function Tests: Recent Labs  Lab 05/20/24 0325  AST 24  ALT 6  ALKPHOS 56  BILITOT 0.7  PROT 4.8*  ALBUMIN 2.7*   No results for input(s): LIPASE, AMYLASE in the last 168 hours. No results for input(s): AMMONIA in the last 168 hours. Coagulation Profile: No results for input(s): INR, PROTIME in the last 168 hours. Cardiac Enzymes: No results for input(s): CKTOTAL, CKMB, CKMBINDEX, TROPONINI in the last 168 hours. BNP (last 3 results) No results for input(s): PROBNP in the last 8760 hours. HbA1C: No results for input(s): HGBA1C in the last 72 hours. CBG: No results for input(s): GLUCAP in the last 168 hours. Lipid Profile: No results for input(s): CHOL, HDL, LDLCALC, TRIG, CHOLHDL, LDLDIRECT in the last 72 hours. Thyroid  Function Tests: No results for input(s): TSH, T4TOTAL, FREET4, T3FREE, THYROIDAB in the last 72 hours. Anemia Panel: No results for input(s): VITAMINB12, FOLATE, FERRITIN, TIBC, IRON, RETICCTPCT in the last 72 hours. Sepsis Labs: No results for input(s): PROCALCITON, LATICACIDVEN in the last 168 hours.  Recent Results  (from the past 240 hours)  Surgical pcr screen     Status: None   Collection Time: 05/18/24  1:58 PM   Specimen: Nasal Mucosa; Nasal Swab  Result Value Ref Range Status   MRSA, PCR NEGATIVE NEGATIVE Final   Staphylococcus aureus NEGATIVE NEGATIVE Final    Comment: (NOTE) The Xpert SA Assay (FDA approved for NASAL specimens in patients 40 years of age and older), is one component of a comprehensive surveillance program. It is not intended to diagnose infection nor to guide or monitor treatment. Performed at [facility], [address], [place], [place] [address]          Radiology Studies: No results found.       Scheduled Meds:  amLODipine   10 mg Oral Daily   atorvastatin   40 mg Oral QPM   doxycycline   100 mg Oral Q12H   DULoxetine   30 mg Oral Daily   ferrous sulfate   325 mg Oral Once per day on Monday Wednesday Friday   hydrALAZINE   50 mg Oral Q8H   magnesium  oxide  400 mg Oral Daily   melatonin  5 mg Oral QHS   polyethylene glycol  17 g Oral Daily   senna-docusate  1 tablet Oral BID   Continuous Infusions:   LOS: 4 days      Renato Applebaum, MD [facility]

## 2024-05-22 NOTE — Plan of Care (Signed)

## 2024-05-23 MED ORDER — ACETAMINOPHEN 325 MG PO TABS
650.0000 mg | ORAL_TABLET | Freq: Four times a day (QID) | ORAL | Status: DC | PRN
  Administered 2024-05-23: 650 mg via ORAL
  Filled 2024-05-23: qty 2

## 2024-05-23 MED ORDER — MAGNESIUM OXIDE -MG SUPPLEMENT 400 (240 MG) MG PO TABS: 400.0000 mg | ORAL_TABLET | Freq: Every day | ORAL | Status: AC

## 2024-05-23 MED ORDER — SENNOSIDES-DOCUSATE SODIUM 8.6-50 MG PO TABS: 1.0000 | ORAL_TABLET | Freq: Two times a day (BID) | ORAL | Status: AC

## 2024-05-23 MED ORDER — POLYETHYLENE GLYCOL 3350 17 G PO PACK: 17.0000 g | PACK | Freq: Every day | ORAL | Status: AC

## 2024-05-23 MED ORDER — DOXYCYCLINE HYCLATE 100 MG PO TABS: 100.0000 mg | ORAL_TABLET | Freq: Two times a day (BID) | ORAL | 0 refills | Status: AC

## 2024-05-23 NOTE — TOC Transition Note (Signed)
 Transition of Care [facility]) - Discharge Note   Patient Details  Name: Tabitha Sanders MRN: [id number] Date of Birth: [birth year]  [facility] CM/SW Contact:  Bridget Cordella Simmonds, LCSW Phone Number: 05/23/2024, 11:55 AM   Clinical Narrative:  Pt discharging to [facility].  RN report to [phone].    [name] will transport.  Pt will need to be brought to main north tower entrance with assistance getting into the vehicle.    Final next level of care: Skilled Nursing Facility Barriers to Discharge: Barriers Resolved   Patient Goals and CMS Choice Patient states their goals for this hospitalization and ongoing recovery are:: get back to where I was          Discharge Placement              Patient chooses bed at: [facility] and Rehab Patient to be transferred to facility by: daughter [name] Name of family member notified: daughter [name] Patient and family notified of of transfer: 05/23/24  Discharge Plan and Services Additional resources added to the After Visit Summary for   In-house Referral: Clinical Social Work                                   Social Drivers of Health (SDOH) Interventions SDOH Screenings   Food Insecurity: No Food Insecurity (05/18/2024)  Housing: Low Risk  (05/18/2024)  Transportation Needs: No Transportation Needs (05/18/2024)  Utilities: Not At Risk (05/18/2024)  Social Connections: Moderately Isolated (05/18/2024)  Tobacco Use: Low Risk  (05/18/2024)     Readmission Risk Interventions     No data to display

## 2024-05-23 NOTE — TOC Progression Note (Addendum)
 Transition of Care [facility]) - Progression Note    Patient Details  Name: Tabitha Sanders MRN: [id number] Date of Birth: [birth year]  Transition of Care [facility]) CM/SW Contact  Bridget Cordella Simmonds, LCSW Phone Number: 05/23/2024, 8:44 AM  Clinical Narrative:   SNF auth request submitted in [place].   1110: SNF auth approved: [id number], 3 days: 12/9-12/11.  CSW confirmed with [facility]: they can receive pt today.    MD notified.   1150: CSW spoke with [name] regarding transportation: she is able to transport to [place].  She is there now waiting to sign admission paperwork and will come to [facility] afterwards.    Expected Discharge Plan: Skilled Nursing Facility Barriers to Discharge: Barriers Resolved               Expected Discharge Plan and Services In-house Referral: Clinical Social Work     Living arrangements for the past 2 months: Single Family Home                                       Social Drivers of Health (SDOH) Interventions SDOH Screenings   Food Insecurity: No Food Insecurity (05/18/2024)  Housing: Low Risk  (05/18/2024)  Transportation Needs: No Transportation Needs (05/18/2024)  Utilities: Not At Risk (05/18/2024)  Social Connections: Moderately Isolated (05/18/2024)  Tobacco Use: Low Risk  (05/18/2024)    Readmission Risk Interventions     No data to display

## 2024-05-23 NOTE — Progress Notes (Signed)
 Mobility Specialist: Progress Note   05/23/24 1200  Mobility  Activity Ambulated with assistance  Level of Assistance Minimal assist, patient does 75% or more  Assistive Device Other (Comment) (HHA)  Distance Ambulated (ft) 50 ft  RUE Weight Bearing Per Provider Order NWB  Activity Response Tolerated well  Mobility Referral Yes  Mobility visit 1 Mobility  Mobility Specialist Start Time (ACUTE ONLY) 1042  Mobility Specialist Stop Time (ACUTE ONLY) 1059  Mobility Specialist Time Calculation (min) (ACUTE ONLY) 17 min    Pt received in bed, agreeable to mobility session. Light minA for bed mobility to assist with trunk elevation. MinA for STS and ambulation. Min cues need to posture. Ambulated a few feet down the hallway and returned to sit in recliner chair. Left in recliner with all needs met, call bell in reach. Chair alarm on.   Ileana Lute Mobility Specialist Please contact via SecureChat or Rehab office at [phone]

## 2024-05-23 NOTE — Progress Notes (Signed)
 PROGRESS NOTE    Tabitha Sanders  FMW:[id number] DOB: 01/11/1926 DOA: 05/17/2024 PCP: Charlott Dorn LABOR, MD    Brief Narrative:  89 year old with A-fib on Eliquis , anemia of chronic disease, essential hypertension, hyperlipidemia, CVA and polymyalgia rheumatica presented to the ER with of fall, she hit her right arm on a metal part of a bed rail causing laceration and hematoma.  In the emergency room skeletal survey negative.  Head CT normal.  CT angiogram right upper extremity with subcutaneous and intramuscular edema and hemorrhage with several foci of active bleeding deep to the laceration with intact axillary brachial radial and ulnar arteries.  Resuscitated, transferred to [facility] and taken to operating room by hand surgery, debridement and closure of the wound.  Subjective: No new events.  Moving the right shoulder.  Difficulty getting around due to the restriction on the right hand. Discussed with the surgeon if they are able to see her postop dressing today.  Assessment & Plan:   Traumatic laceration of the right forearm with hematoma: Status post exploration, evacuation of hematoma and repair of skin laceration 12/4 by Dr. Murrell Posterior splint, dressing intact.  Advised not to remove until clinic follow-up in 1 week. Doxycycline  for 1 week Received Tdap Continue to mobilize,, elevate the arm.  PT OT.  Acute blood loss anemia: Coagulopathic on Eliquis . Recent baseline hemoglobin 10-11.  Presented with hemoglobin of 10.2.  Hemoglobin dropped to  6.1.  No evidence of ongoing bleeding. Given 2 units of PRBC with appropriate improvement.  Will resume Eliquis  once okayed by surgery.  Paroxysmal A-fib: Rate controlled.  Apparently not on any beta-blockers.  Eliquis  on hold given significant bleeding.  Will continue to hold until surgical wound exam on follow-up.  Hypertension: Blood pressure is stable.  Resumed amlodipine , hydralazine  and irbesartan.  Benign  positional vertigo: Work with PT OT.  Depression anxiety: On Cymbalta .  Continue.  Acute kidney injury with ATN: Due to acute blood loss.  Treated with IV fluids.  Improved.   Hypomagnesemia: Will keep on magnesium  replacement.  DVT prophylaxis: SCD's Start: 05/18/24 1815 Place TED hose Start: 05/18/24 0815   Code Status: Full code Family Communication: None at the bedside Disposition Plan: Status is: Inpatient Remains inpatient appropriate because: Medically stable.  Transfer to SNF when bed available.     Consultants:  Hand surgery  Procedures:  Debridement and repair of the right hand injury  Antimicrobials:  Preoperative, doxycycline      Objective: Vitals:   05/22/24 1606 05/22/24 2040 05/23/24 0429 05/23/24 0758  BP: 128/62 127/60 (!) 150/65 (!) 133/59  Pulse: 93 82 84 79  Resp: 16 16 16 16   Temp: 97.8 F (36.6 C) 98.5 F (36.9 C) 98.6 F (37 C) 98.4 F (36.9 C)  TempSrc:  Oral Oral Oral  SpO2: 97% 98% 95% 98%  Weight:      Height:        Intake/Output Summary (Last 24 hours) at 05/23/2024 1029 Last data filed at 05/22/2024 1242 Gross per 24 hour  Intake 240 ml  Output --  Net 240 ml   Filed Weights   05/17/24 2317  Weight: 60.3 kg    Examination:  General exam: Pleasant interactive.  She looks comfortable. Respiratory system: Clear to auscultation. Respiratory effort normal.  No added sounds. Cardiovascular system: S1 & S2 heard, RRR.  Gastrointestinal system: Abdomen is nondistended, soft and nontender. No organomegaly or masses felt. Normal bowel sounds heard. Central nervous system: Alert and oriented. No focal  neurological deficits. Extremities: Symmetric 5 x 5 power. Skin:  Right arm on splint , improved swelling of the fingers.  Able to mobilize.    Data Reviewed: I have personally reviewed following labs and imaging studies  CBC: Recent Labs  Lab 05/18/24 0010 05/18/24 0021 05/18/24 0549 05/19/24 0515 05/19/24 2341  05/20/24 0325 05/22/24 0522  WBC 9.8  --  12.2* 8.6  --  8.6 6.3  NEUTROABS  --   --   --   --   --  6.7 4.7  HGB 10.8*   < > 8.4* 6.1* 8.3* 8.9* 9.6*  HCT 31.1*   < > 24.6* 17.8* 24.0* 25.7* 27.8*  MCV 91.7  --  90.8 92.2  --  86.8 87.1  PLT 308  --  274 187  --  178 261   < > = values in this interval not displayed.   Basic Metabolic Panel: Recent Labs  Lab 05/18/24 0010 05/18/24 0021 05/19/24 0515 05/20/24 0325 05/22/24 0522  NA 139 140 138 134* 138  K 4.2 4.2 4.3 4.1 4.3  CL 109 108 108 106 107  CO2 20*  --  20* 22 25  GLUCOSE 137* 134* 97 97 103*  BUN 22 22 19 19 19   CREATININE 1.57* 1.80* 1.11* 1.30* 1.13*  CALCIUM  9.8  --  8.7* 8.9 9.1  MG  --   --   --  1.6*  --    GFR: Estimated Creatinine Clearance: 24.9 mL/min (A) (by C-G formula based on SCr of 1.13 mg/dL (H)). Liver Function Tests: Recent Labs  Lab 05/20/24 0325  AST 24  ALT 6  ALKPHOS 56  BILITOT 0.7  PROT 4.8*  ALBUMIN 2.7*   No results for input(s): LIPASE, AMYLASE in the last 168 hours. No results for input(s): AMMONIA in the last 168 hours. Coagulation Profile: No results for input(s): INR, PROTIME in the last 168 hours. Cardiac Enzymes: No results for input(s): CKTOTAL, CKMB, CKMBINDEX, TROPONINI in the last 168 hours. BNP (last 3 results) No results for input(s): PROBNP in the last 8760 hours. HbA1C: No results for input(s): HGBA1C in the last 72 hours. CBG: No results for input(s): GLUCAP in the last 168 hours. Lipid Profile: No results for input(s): CHOL, HDL, LDLCALC, TRIG, CHOLHDL, LDLDIRECT in the last 72 hours. Thyroid  Function Tests: No results for input(s): TSH, T4TOTAL, FREET4, T3FREE, THYROIDAB in the last 72 hours. Anemia Panel: No results for input(s): VITAMINB12, FOLATE, FERRITIN, TIBC, IRON, RETICCTPCT in the last 72 hours. Sepsis Labs: No results for input(s): PROCALCITON, LATICACIDVEN in the last 168  hours.  Recent Results (from the past 240 hours)  Surgical pcr screen     Status: None   Collection Time: 05/18/24  1:58 PM   Specimen: Nasal Mucosa; Nasal Swab  Result Value Ref Range Status   MRSA, PCR NEGATIVE NEGATIVE Final   Staphylococcus aureus NEGATIVE NEGATIVE Final    Comment: (NOTE) The Xpert SA Assay (FDA approved for NASAL specimens in patients 74 years of age and older), is one component of a comprehensive surveillance program. It is not intended to diagnose infection nor to guide or monitor treatment. Performed at [facility], [address] [place], [place] [address]          Radiology Studies: No results found.       Scheduled Meds:  amLODipine   10 mg Oral Daily   atorvastatin   40 mg Oral QPM   doxycycline   100 mg Oral Q12H  DULoxetine   30 mg Oral Daily   ferrous sulfate   325 mg Oral Once per day on Monday Wednesday Friday   hydrALAZINE   50 mg Oral Q8H   magnesium  oxide  400 mg Oral Daily   melatonin  5 mg Oral QHS   polyethylene glycol  17 g Oral Daily   senna-docusate  1 tablet Oral BID   Continuous Infusions:   LOS: 5 days      Renato Applebaum, MD [facility]

## 2024-05-23 NOTE — Discharge Summary (Signed)
 Physician Discharge Summary  Tabitha Sanders FMW:[id number] DOB: 06-21-1925 DOA: 05/17/2024  PCP: Charlott Dorn LABOR, MD  Admit date: 05/17/2024 Discharge date: 05/23/2024  Admitted From: Home Disposition: Skilled nursing facility  Recommendations for Outpatient Follow-up:  Follow up with PCP in 1-2 weeks Please obtain BMP/CBC in one week Schedule follow-up with orthopedics next 2 to 3 days.  Discharge Condition: Stable CODE STATUS: Full code Diet recommendation: Regular diet  Discharge summary: 89 year old with A-fib on Eliquis , anemia of chronic disease, essential hypertension, hyperlipidemia, CVA and polymyalgia rheumatica presented to the ER with fall at home, she hit her right arm on a metal part of a bed rail causing laceration and hematoma.  In the emergency room skeletal survey negative.  Head CT normal.  CT angiogram right upper extremity with subcutaneous and intramuscular edema and hemorrhage with several foci of active bleeding deep to the laceration with intact axillary brachial radial and ulnar arteries.  Resuscitated, transferred to [facility] and taken to operating room by hand surgery, debridement and closure of the wound. Patient had significant bleeding.  She remained in the hospital after surgery due to mobility impairment.  He stabilized now.  Will be transition to a skilled nursing facility.  # Traumatic laceration of the right forearm with hematoma: Status post exploration, evacuation of hematoma and repair of skin laceration 12/4 by Dr. Murrell Posterior splint, dressing intact.  Advised not to remove until clinic follow-up this week. Doxycycline  for 1 week, 2 days remaining. Received Tdap Continue to mobilize,, elevate the arm.  PT OT to continue to work.   Acute blood loss anemia: Coagulopathic on Eliquis . Recent baseline hemoglobin 10-11.  Presented with hemoglobin of 10.2.  Hemoglobin dropped to  6.1 due to acute blood loss.  No evidence of ongoing  bleeding after the procedure. Given 2 units of PRBC with appropriate improvement.  Given the stability and no further blood loss, will resume Eliquis  tomorrow 12/10.   Paroxysmal A-fib: Rate controlled.  Apparently not on any beta-blockers.  Will resume Eliquis .   Hypertension: Blood pressure is stable.  Resumed amlodipine , hydralazine  and irbesartan.   Benign positional vertigo: Work with PT OT.  Stable now.   Depression anxiety: On Cymbalta .  Continue.   Acute kidney injury with ATN: Due to acute blood loss.  Treated with IV fluids.  Improved.  Normalized.  Recheck in 1 week.   Hypomagnesemia: Will keep on magnesium  replacement.  Recheck in 1 week.  Stable to transition to a SNF with outpatient follow-up.   Discharge Diagnoses:  Principal Problem:   Laceration of arm, right, initial encounter Active Problems:   Stroke (HCC)   Benign paroxysmal positional vertigo   Essential hypertension   Hyperlipidemia   Paroxysmal atrial fibrillation (HCC)   Depression   This    Discharge Instructions  Discharge Instructions     Call MD for:  severe uncontrolled pain   Complete by: As directed    Diet general   Complete by: As directed    Discharge instructions   Complete by: As directed    Call and schedule appointment with orthopedics in next 2-3 days   Increase activity slowly   Complete by: As directed    Leave dressing on - Keep it clean, dry, and intact until clinic visit   Complete by: As directed       Allergies as of 05/23/2024       Reactions   Codeine Nausea Only   Lidocaine -epinephrine  (pf) Other (See Comments)  Tachycardia; reports that she can tolerate plain lidocaine    Lisinopril Cough        Medication List     TAKE these medications    acetaminophen  500 MG tablet Commonly known as: TYLENOL  Take 500 mg by mouth every 6 (six) hours as needed for mild pain.   amLODipine  10 MG tablet Commonly known as: NORVASC  Take 10 mg by mouth daily.    atorvastatin  40 MG tablet Commonly known as: LIPITOR Take 40 mg by mouth every evening.   betamethasone dipropionate 0.05 % cream Apply 1 application  topically 2 (two) times daily.   doxycycline  100 MG tablet Commonly known as: VIBRA -TABS Take 1 tablet (100 mg total) by mouth every 12 (twelve) hours for 2 days.   DULoxetine  30 MG capsule Commonly known as: CYMBALTA  Take 30 mg by mouth daily.   Eliquis  2.5 MG Tabs tablet Generic drug: apixaban  TAKE 1 TABLET(2.5 MG) BY MOUTH TWICE DAILY   ergocalciferol 1.25 MG (50000 UT) capsule Commonly known as: VITAMIN D2 Take 50,000 Units by mouth once a week. Friday   ferrous sulfate  325 (65 FE) MG tablet Take 325 mg by mouth 3 (three) times a week. Take on Monday Wednesday Friday   hydrALAZINE  50 MG tablet Commonly known as: APRESOLINE  TAKE 1 TABLET(50 MG) BY MOUTH THREE TIMES DAILY What changed: See the new instructions.   HYDROcodone -acetaminophen  5-325 MG tablet Commonly known as: NORCO/VICODIN Take 1 tablet by mouth every 4 (four) hours as needed for moderate pain (pain score 4-6) or severe pain (pain score 7-10).   irbesartan 75 MG tablet Commonly known as: AVAPRO Take 75 mg by mouth daily.   latanoprost 0.005 % ophthalmic solution Commonly known as: XALATAN Place 1 drop into both eyes at bedtime.   Lumigan 0.01 % Soln Generic drug: bimatoprost Place 1 drop into both eyes at bedtime.   magnesium  oxide 400 (240 Mg) MG tablet Commonly known as: MAG-OX Take 1 tablet (400 mg total) by mouth daily for 7 days. Start taking on: May 24, 2024   melatonin 5 MG Tabs Take 5 mg by mouth at bedtime.   Multaq  400 MG tablet Generic drug: dronedarone  TAKE 1 TABLET BY MOUTH TWICE DAILY WITH A MEAL   polyethylene glycol 17 g packet Commonly known as: MIRALAX  / GLYCOLAX  Take 17 g by mouth daily. Start taking on: May 24, 2024   PRESERVISION AREDS PO Take 1 capsule by mouth 2 (two) times daily.   senna-docusate  8.6-50 MG tablet Commonly known as: Senokot-S Take 1 tablet by mouth 2 (two) times daily.   triamcinolone cream 0.1 % Commonly known as: KENALOG Apply 1 Application topically 2 (two) times daily.   VITAMIN B-12 PO Take 1 tablet by mouth 2 (two) times a week. Take on Mondays and Wednesdays               Discharge Care Instructions  (From admission, onward)           Start     Ordered   05/23/24 0000  Leave dressing on - Keep it clean, dry, and intact until clinic visit        05/23/24 1120            Follow-up Information     Murrell Drivers, MD Follow up in 1 week(s).   Specialty: Orthopedic Surgery Contact information: [address] [place] [address] [phone]         Charlott Dorn LABOR, MD Follow up.   Specialty: Internal Medicine  Contact information: [address] [place] [address] [phone]                Allergies  Allergen Reactions   Codeine Nausea Only   Lidocaine -Epinephrine  (Pf) Other (See Comments)    Tachycardia; reports that she can tolerate plain lidocaine    Lisinopril Cough    Consultations: Hand surgery   Procedures/Studies: DG MINI C-ARM IMAGE ONLY Result Date: 05/18/2024 There is no interpretation for this exam.  This order is for images obtained during a surgical procedure.  Please See Surgeries Tab for more information regarding the procedure.   CT ANGIO UP EXTREM RIGHT W &/OR WO CONTRAST Result Date: 05/18/2024 EXAM: CTA RIGHT UPPER EXTREMITY 05/18/2024 01:06:06 AM TECHNIQUE: Contrast-enhanced computed tomography angiography of the upper extremity was performed with multiplanar reconstructions. Automated exposure control, iterative reconstruction, and/or weight based adjustment of the mA/kV was utilized to reduce the radiation dose to as low as reasonably achievable. COMPARISON: None available. CLINICAL HISTORY: Upper extremity trauma, penetrating Upper extremity trauma, penetrating  FINDINGS: ARTERIAL: The axillary, brachial, radial and ulnar arteries are patent with no stenosis, occlusion, aneurysm or dissection. BONES AND SOFT TISSUES: Deep to the laceration within the ulnar aspect of the right forearm there is subcutaneous and intramuscular edema and hemorrhage with several foci of active bleeding ( circa series 7 image 86 ) . No acute fracture. No dislocation. IMPRESSION: 1. Subcutaneous and intramuscular edema and hemorrhage with several foci of active bleeding deep to the laceration within the ulnar aspect of the right forearm. 2. Patent axillary, brachial, radial, and ulnar arteries without acute injury. Electronically signed by: Norman Gatlin MD 05/18/2024 01:51 AM EST RP Workstation: [id number]   CT Head Wo Contrast Result Date: 05/18/2024 CLINICAL DATA:  Status post fall. EXAM: CT HEAD WITHOUT CONTRAST TECHNIQUE: Contiguous axial images were obtained from the base of the skull through the vertex without intravenous contrast. RADIATION DOSE REDUCTION: This exam was performed according to the departmental dose-optimization program which includes automated exposure control, adjustment of the mA and/or kV according to patient size and/or use of iterative reconstruction technique. COMPARISON:  June 04, 2021 FINDINGS: Brain: There is mild cerebral atrophy with widening of the extra-axial spaces and ventricular dilatation. There are areas of decreased attenuation within the white matter tracts of the supratentorial brain, consistent with microvascular disease changes. Vascular: A left cavernous carotid artery stent is noted. Skull: Normal. Negative for fracture or focal lesion. Sinuses/Orbits: No acute finding. Other: None. IMPRESSION: 1. No acute intracranial abnormality. 2. Generalized cerebral atrophy and microvascular disease changes of the supratentorial brain. 3. Left cavernous carotid artery stent. Electronically Signed   By: Suzen Dials M.D.   On: 05/18/2024 01:23    (Echo, Carotid, EGD, Colonoscopy, ERCP)    Subjective: Patient seen in the morning rounds.  Denies any complaints.  Tylenol  is taking care of the pain.   Discharge Exam: Vitals:   05/23/24 0429 05/23/24 0758  BP: (!) 150/65 (!) 133/59  Pulse: 84 79  Resp: 16 16  Temp: 98.6 F (37 C) 98.4 F (36.9 C)  SpO2: 95% 98%   Vitals:   05/22/24 1606 05/22/24 2040 05/23/24 0429 05/23/24 0758  BP: 128/62 127/60 (!) 150/65 (!) 133/59  Pulse: 93 82 84 79  Resp: 16 16 16 16   Temp: 97.8 F (36.6 C) 98.5 F (36.9 C) 98.6 F (37 C) 98.4 F (36.9 C)  TempSrc:  Oral Oral Oral  SpO2: 97% 98% 95% 98%  Weight:  Height:        General: Pt is alert, awake, not in acute distress.  Age-appropriate.  Pleasant and interactive. Cardiovascular: RRR, S1/S2 +, no rubs, no gallops Respiratory: CTA bilaterally, no wheezing, no rhonchi Abdominal: Soft, NT, ND, bowel sounds + Extremities:  no cyanosis Right hand on posterior splint, dressing intact.  Fingers with receding swelling.  Neurovascular status intact.  Able to mobilize.    The results of significant diagnostics from this hospitalization (including imaging, microbiology, ancillary and laboratory) are listed below for reference.     Microbiology: Recent Results (from the past 240 hours)  Surgical pcr screen     Status: None   Collection Time: 05/18/24  1:58 PM   Specimen: Nasal Mucosa; Nasal Swab  Result Value Ref Range Status   MRSA, PCR NEGATIVE NEGATIVE Final   Staphylococcus aureus NEGATIVE NEGATIVE Final    Comment: (NOTE) The Xpert SA Assay (FDA approved for NASAL specimens in patients 63 years of age and older), is one component of a comprehensive surveillance program. It is not intended to diagnose infection nor to guide or monitor treatment. Performed at [facility], [address], [place], [place] [address]      Labs: BNP (last 3 results) No results for input(s): BNP in the last 8760 hours. Basic  Metabolic Panel: Recent Labs  Lab 05/18/24 0010 05/18/24 0021 05/19/24 0515 05/20/24 0325 05/22/24 0522  NA 139 140 138 134* 138  K 4.2 4.2 4.3 4.1 4.3  CL 109 108 108 106 107  CO2 20*  --  20* 22 25  GLUCOSE 137* 134* 97 97 103*  BUN 22 22 19 19 19   CREATININE 1.57* 1.80* 1.11* 1.30* 1.13*  CALCIUM  9.8  --  8.7* 8.9 9.1  MG  --   --   --  1.6*  --    Liver Function Tests: Recent Labs  Lab 05/20/24 0325  AST 24  ALT 6  ALKPHOS 56  BILITOT 0.7  PROT 4.8*  ALBUMIN 2.7*   No results for input(s): LIPASE, AMYLASE in the last 168 hours. No results for input(s): AMMONIA in the last 168 hours. CBC: Recent Labs  Lab 05/18/24 0010 05/18/24 0021 05/18/24 0549 05/19/24 0515 05/19/24 2341 05/20/24 0325 05/22/24 0522  WBC 9.8  --  12.2* 8.6  --  8.6 6.3  NEUTROABS  --   --   --   --   --  6.7 4.7  HGB 10.8*   < > 8.4* 6.1* 8.3* 8.9* 9.6*  HCT 31.1*   < > 24.6* 17.8* 24.0* 25.7* 27.8*  MCV 91.7  --  90.8 92.2  --  86.8 87.1  PLT 308  --  274 187  --  178 261   < > = values in this interval not displayed.   Cardiac Enzymes: No results for input(s): CKTOTAL, CKMB, CKMBINDEX, TROPONINI in the last 168 hours. BNP: Invalid input(s): POCBNP CBG: No results for input(s): GLUCAP in the last 168 hours. D-Dimer No results for input(s): DDIMER in the last 72 hours. Hgb A1c No results for input(s): HGBA1C in the last 72 hours. Lipid Profile No results for input(s): CHOL, HDL, LDLCALC, TRIG, CHOLHDL, LDLDIRECT in the last 72 hours. Thyroid  function studies No results for input(s): TSH, T4TOTAL, T3FREE, THYROIDAB in the last 72 hours.  Invalid input(s): FREET3 Anemia work up No results for input(s): VITAMINB12, FOLATE, FERRITIN, TIBC, IRON, RETICCTPCT in the last 72 hours. Urinalysis    Component Value Date/Time  COLORURINE YELLOW 11/19/2017 1518   APPEARANCEUR CLEAR 11/19/2017 1518   LABSPEC 1.016 11/19/2017 1518    PHURINE 5.0 11/19/2017 1518   GLUCOSEU NEGATIVE 11/19/2017 1518   HGBUR NEGATIVE 11/19/2017 1518   BILIRUBINUR NEGATIVE 11/19/2017 1518   KETONESUR NEGATIVE 11/19/2017 1518   PROTEINUR NEGATIVE 11/19/2017 1518   UROBILINOGEN 0.2 01/25/2015 1454   NITRITE NEGATIVE 11/19/2017 1518   LEUKOCYTESUR SMALL (A) 11/19/2017 1518   Sepsis Labs Recent Labs  Lab 05/18/24 0549 05/19/24 0515 05/20/24 0325 05/22/24 0522  WBC 12.2* 8.6 8.6 6.3   Microbiology Recent Results (from the past 240 hours)  Surgical pcr screen     Status: None   Collection Time: 05/18/24  1:58 PM   Specimen: Nasal Mucosa; Nasal Swab  Result Value Ref Range Status   MRSA, PCR NEGATIVE NEGATIVE Final   Staphylococcus aureus NEGATIVE NEGATIVE Final    Comment: (NOTE) The Xpert SA Assay (FDA approved for NASAL specimens in patients 27 years of age and older), is one component of a comprehensive surveillance program. It is not intended to diagnose infection nor to guide or monitor treatment. Performed at [facility], [address], [place], [place] [address]      Time coordinating discharge: 35 minutes  SIGNED:   Renato Applebaum, MD  [facility] 05/23/2024, 11:20 AM

## 2024-05-23 NOTE — Progress Notes (Signed)
 Attempted report at [facility] twice.  Spoke with diplomatic services operational officer and left phone number for nurse to call me back for questions.  Pt en route to facility now with daughter.SABRA

## 2024-05-29 ENCOUNTER — Encounter (HOSPITAL_COMMUNITY): Payer: Self-pay | Admitting: Orthopedic Surgery

## 2024-05-29 DIAGNOSIS — S51811D Laceration without foreign body of right forearm, subsequent encounter: Secondary | ICD-10-CM | POA: Diagnosis not present

## 2024-05-29 NOTE — Anesthesia Postprocedure Evaluation (Signed)
 Anesthesia Post Note  Patient: Tabitha Sanders  Procedure(s) Performed: WOUND EXPLORATION (Right) REPAIR, LACERATION, 2 OR MORE (Right) INCISION AND DRAINAGE OF HEMATOMA, UPPER ARM (Left)     Patient location during evaluation: PACU Anesthesia Type: Regional Level of consciousness: awake and alert Pain management: pain level controlled Vital Signs Assessment: post-procedure vital signs reviewed and stable Respiratory status: spontaneous breathing, nonlabored ventilation and respiratory function stable Cardiovascular status: stable and blood pressure returned to baseline Anesthetic complications: no   No notable events documented.  Last Vitals:  Vitals:   05/23/24 0429 05/23/24 0758  BP: (!) 150/65 (!) 133/59  Pulse: 84 79  Resp: 16 16  Temp: 37 C 36.9 C  SpO2: 95% 98%    Last Pain:  Vitals:   05/23/24 1214  TempSrc:   PainSc: 4                  [name] Like

## 2024-07-07 ENCOUNTER — Ambulatory Visit: Admitting: Pulmonary Disease
